# Patient Record
Sex: Male | Born: 1950 | Race: White | Hispanic: No | Marital: Married | Smoking: Former smoker
Health system: Southern US, Community
[De-identification: ages and names within clinical notes are randomized; demographics above are authoritative.]

## PROBLEM LIST (undated history)

## (undated) DIAGNOSIS — E785 Hyperlipidemia, unspecified: Secondary | ICD-10-CM

## (undated) DIAGNOSIS — G4730 Sleep apnea, unspecified: Secondary | ICD-10-CM

## (undated) DIAGNOSIS — M4320 Fusion of spine, site unspecified: Secondary | ICD-10-CM

## (undated) DIAGNOSIS — I2510 Atherosclerotic heart disease of native coronary artery without angina pectoris: Secondary | ICD-10-CM

## (undated) DIAGNOSIS — G5600 Carpal tunnel syndrome, unspecified upper limb: Secondary | ICD-10-CM

## (undated) DIAGNOSIS — K219 Gastro-esophageal reflux disease without esophagitis: Secondary | ICD-10-CM

## (undated) HISTORY — PX: CATARACT EXTRACTION: SUR2

## (undated) HISTORY — DX: Fusion of spine, site unspecified: M43.20

## (undated) HISTORY — DX: Hyperlipidemia, unspecified: E78.5

## (undated) HISTORY — PX: CERVICAL LAMINECTOMY: SHX94

## (undated) HISTORY — DX: Atherosclerotic heart disease of native coronary artery without angina pectoris: I25.10

## (undated) HISTORY — PX: OTHER SURGICAL HISTORY: SHX169

## (undated) HISTORY — DX: Carpal tunnel syndrome, unspecified upper limb: G56.00

## (undated) HISTORY — PX: CARPAL TUNNEL RELEASE: SHX101

## (undated) HISTORY — DX: Gastro-esophageal reflux disease without esophagitis: K21.9

## (undated) HISTORY — PX: CORONARY ARTERY BYPASS GRAFT: SHX141

## (undated) HISTORY — PX: RHINOPLASTY: SUR1284

---

## 2001-12-19 ENCOUNTER — Encounter: Payer: Self-pay | Admitting: Neurosurgery

## 2001-12-19 ENCOUNTER — Ambulatory Visit (HOSPITAL_COMMUNITY): Admission: RE | Admit: 2001-12-19 | Discharge: 2001-12-20 | Payer: Self-pay | Admitting: Neurosurgery

## 2002-01-11 ENCOUNTER — Encounter: Payer: Self-pay | Admitting: Neurosurgery

## 2002-01-11 ENCOUNTER — Encounter: Admission: RE | Admit: 2002-01-11 | Discharge: 2002-01-11 | Payer: Self-pay | Admitting: Neurosurgery

## 2002-09-18 ENCOUNTER — Ambulatory Visit (HOSPITAL_BASED_OUTPATIENT_CLINIC_OR_DEPARTMENT_OTHER): Admission: RE | Admit: 2002-09-18 | Discharge: 2002-09-18 | Payer: Self-pay | Admitting: Orthopedic Surgery

## 2006-08-22 ENCOUNTER — Ambulatory Visit (HOSPITAL_COMMUNITY): Admission: RE | Admit: 2006-08-22 | Discharge: 2006-08-22 | Payer: Self-pay | Admitting: Ophthalmology

## 2009-08-20 ENCOUNTER — Encounter: Payer: Self-pay | Admitting: Cardiology

## 2009-12-16 ENCOUNTER — Encounter: Payer: Self-pay | Admitting: Cardiology

## 2009-12-24 ENCOUNTER — Encounter: Payer: Self-pay | Admitting: Cardiology

## 2009-12-30 ENCOUNTER — Ambulatory Visit: Payer: Self-pay | Admitting: Cardiology

## 2010-01-02 ENCOUNTER — Ambulatory Visit: Payer: Self-pay | Admitting: Cardiology

## 2010-01-02 ENCOUNTER — Encounter (INDEPENDENT_AMBULATORY_CARE_PROVIDER_SITE_OTHER): Payer: Self-pay | Admitting: *Deleted

## 2010-01-02 DIAGNOSIS — M279 Disease of jaws, unspecified: Secondary | ICD-10-CM | POA: Insufficient documentation

## 2010-01-02 DIAGNOSIS — H469 Unspecified optic neuritis: Secondary | ICD-10-CM | POA: Insufficient documentation

## 2010-01-02 DIAGNOSIS — R072 Precordial pain: Secondary | ICD-10-CM | POA: Insufficient documentation

## 2010-01-02 DIAGNOSIS — R9430 Abnormal result of cardiovascular function study, unspecified: Secondary | ICD-10-CM | POA: Insufficient documentation

## 2010-01-05 ENCOUNTER — Encounter: Payer: Self-pay | Admitting: Cardiology

## 2010-01-06 ENCOUNTER — Ambulatory Visit: Payer: Self-pay | Admitting: Cardiovascular Disease

## 2010-01-06 ENCOUNTER — Encounter: Payer: Self-pay | Admitting: Cardiovascular Disease

## 2010-01-06 ENCOUNTER — Inpatient Hospital Stay (HOSPITAL_BASED_OUTPATIENT_CLINIC_OR_DEPARTMENT_OTHER): Admission: RE | Admit: 2010-01-06 | Discharge: 2010-01-06 | Payer: Self-pay | Admitting: Cardiovascular Disease

## 2010-01-06 ENCOUNTER — Ambulatory Visit (HOSPITAL_COMMUNITY): Admission: EM | Admit: 2010-01-06 | Discharge: 2010-01-07 | Payer: Self-pay | Admitting: Cardiology

## 2010-01-06 ENCOUNTER — Ambulatory Visit: Payer: Self-pay | Admitting: Thoracic Surgery (Cardiothoracic Vascular Surgery)

## 2010-01-07 ENCOUNTER — Ambulatory Visit: Payer: Self-pay | Admitting: Vascular Surgery

## 2010-01-07 ENCOUNTER — Encounter: Payer: Self-pay | Admitting: Thoracic Surgery (Cardiothoracic Vascular Surgery)

## 2010-01-13 ENCOUNTER — Inpatient Hospital Stay (HOSPITAL_COMMUNITY)
Admission: RE | Admit: 2010-01-13 | Discharge: 2010-01-17 | Payer: Self-pay | Admitting: Thoracic Surgery (Cardiothoracic Vascular Surgery)

## 2010-01-13 ENCOUNTER — Encounter: Payer: Self-pay | Admitting: Thoracic Surgery (Cardiothoracic Vascular Surgery)

## 2010-01-15 ENCOUNTER — Encounter: Payer: Self-pay | Admitting: Physician Assistant

## 2010-01-23 ENCOUNTER — Ambulatory Visit: Payer: Self-pay | Admitting: Thoracic Surgery (Cardiothoracic Vascular Surgery)

## 2010-02-04 ENCOUNTER — Ambulatory Visit: Payer: Self-pay | Admitting: Physician Assistant

## 2010-02-04 DIAGNOSIS — R42 Dizziness and giddiness: Secondary | ICD-10-CM

## 2010-02-04 DIAGNOSIS — E785 Hyperlipidemia, unspecified: Secondary | ICD-10-CM

## 2010-02-04 DIAGNOSIS — I2510 Atherosclerotic heart disease of native coronary artery without angina pectoris: Secondary | ICD-10-CM

## 2010-02-16 ENCOUNTER — Encounter: Payer: Self-pay | Admitting: Cardiovascular Disease

## 2010-02-16 ENCOUNTER — Ambulatory Visit: Payer: Self-pay | Admitting: Thoracic Surgery (Cardiothoracic Vascular Surgery)

## 2010-02-16 ENCOUNTER — Encounter
Admission: RE | Admit: 2010-02-16 | Discharge: 2010-02-16 | Payer: Self-pay | Admitting: Thoracic Surgery (Cardiothoracic Vascular Surgery)

## 2010-02-17 ENCOUNTER — Encounter: Payer: Self-pay | Admitting: Cardiology

## 2010-02-25 ENCOUNTER — Telehealth (INDEPENDENT_AMBULATORY_CARE_PROVIDER_SITE_OTHER): Payer: Self-pay | Admitting: *Deleted

## 2010-02-26 ENCOUNTER — Encounter: Payer: Self-pay | Admitting: Cardiology

## 2010-03-16 ENCOUNTER — Telehealth (INDEPENDENT_AMBULATORY_CARE_PROVIDER_SITE_OTHER): Payer: Self-pay | Admitting: *Deleted

## 2010-03-23 ENCOUNTER — Encounter: Payer: Self-pay | Admitting: Cardiology

## 2010-04-20 ENCOUNTER — Encounter: Payer: Self-pay | Admitting: Physician Assistant

## 2010-04-21 ENCOUNTER — Encounter: Payer: Self-pay | Admitting: Cardiology

## 2010-05-20 ENCOUNTER — Encounter: Payer: Self-pay | Admitting: Cardiology

## 2010-06-10 ENCOUNTER — Encounter: Payer: Self-pay | Admitting: Physician Assistant

## 2010-06-11 ENCOUNTER — Ambulatory Visit
Admission: RE | Admit: 2010-06-11 | Discharge: 2010-06-11 | Payer: Self-pay | Source: Home / Self Care | Attending: Cardiology | Admitting: Cardiology

## 2010-07-07 NOTE — Letter (Signed)
Summary: External Correspondence/ [facility] VIS[facility]  External Correspondence/ [facility]   Imported By: Dorise Hiss 01/01/2010 16:39:59  _____________________________________________________________________  External Attachment:    Type:   Image     Comment:   External Document

## 2010-07-07 NOTE — Progress Notes (Signed)
Summary: Pt call  Phone Note Call from Patient Call back at Home Phone [phone]   Action Taken: Rx Called In Summary of Call: Pt wanted to make sure labs had not fallen through the cracks b/c he states he was told we would set up lab work and call him with appt. Pt notified lab work and f/u appt are due in Dec. Per pt instructions from last office visit, pt will be mailed a reminder for labs and appt due in Dec.   Pt also states he has been having trouble moving his bowels and is having some (R) lower abd pain. Pt notified to see primary MD to evaluate this. Pt verbalized understanding.  Initial call taken by: Cyril Loosen, RN, BSN,  February 25, 2010 5:09 PM

## 2010-07-07 NOTE — Miscellaneous (Signed)
Summary: Rehab Report/ CARDIAC REHAB PROGRESS REPORT  Rehab Report/ CARDIAC REHAB PROGRESS REPORT   Imported By: Dorise Hiss 04/21/2010 16:55:57  _____________________________________________________________________  External Attachment:    Type:   Image     Comment:   External Document

## 2010-07-07 NOTE — Progress Notes (Signed)
Summary: thinks crestor causing abdominal pain      Phone Note Call from Patient   Summary of Call: Has cut back on Crestor.  Thinks may have been causing abdominal pain.  Started while in hospital.  Did see Dr. Dimas Aguas about 2 wks ago, did u/s and found nothing.  Now since cutting back on Crestor, stomach does feel a little better.  Also, has stopped the Lopressor all together.  Will f/u back up wtih PMD if stomach continues to bother.  Taking Crestor every 3 days now and questions changing to something else.   Initial call taken by: Hoover Brunette, LPN,  March 16, 2010 1:39 PM  Follow-up for Phone Call        just a Pravachol 40 mg q.h.s. Follow-up by: Lewayne Bunting, MD, [facility],  March 19, 2010 12:15 PM  Additional Follow-up for Phone Call Additional follow up Details #1::        Left message to return call.  Hoover Brunette, LPN  March 20, 2010 5:39 PM     Additional Follow-up for Phone Call Additional follow up Details #2::    Patient notified.   Will send rx to [facility] per pt request.   Follow-up by: Hoover Brunette, LPN,  March 23, 2010 11:46 AM  New/Updated Medications: PRAVACHOL 40 MG TABS (PRAVASTATIN SODIUM) Take 1 tab by mouth at bedtime Prescriptions: PRAVACHOL 40 MG TABS (PRAVASTATIN SODIUM) Take 1 tab by mouth at bedtime  #30 x 6   Entered by:   Hoover Brunette, LPN   Authorized by:   Lewayne Bunting, MD, [facility]   Signed by:   Hoover Brunette, LPN on 16/03/9603   Method used:   Electronically to        [facility]* (retail)       [address]       [place], [place]  [address]       Ph: [phone]       Fax: [phone]   RxID:   [id number]

## 2010-07-07 NOTE — Miscellaneous (Signed)
Summary: Rehab Report/ FAXED CARDIAC REHAB REFERRAL  Rehab Report/ FAXED CARDIAC REHAB REFERRAL   Imported By: Dorise Hiss 02/17/2010 12:35:27  _____________________________________________________________________  External Attachment:    Type:   Image     Comment:   External Document

## 2010-07-07 NOTE — Assessment & Plan Note (Signed)
Summary: Adrian Gibson   Visit Type:  Follow-up Primary Provider:  Prentiss Bells   History of Present Illness: patient presents for post CABG followup.  He was recently referred for diagnostic cardiac catheterization, per Dr. Andee Lineman, and found to have severe, three-vessel CAD with moderately severe distal left main disease. Left ventricular function was normal. He was referred to Dr. Cornelius Moras, and underwent successful four-vessel CABG, with grafting of LIMA-LAD; SVG-first diagonal; SVG-ramus intermedius; and, SVG distal RCA.  Clinically, patient is doing extremely well. He did have some transient left jaw discomfort approximate 4 days ago, but not nearly as severe as he would typically experience in the past. He also is reporting occasional sensation of feeling groggy, shortly after taking Lopressor. He has noted early morning blood pressure readings in the 95-100 range, at which time he has held his dose. He states that he had to do this on 3 separate occasions. He was not previously on beta blocker, did not have a myocardial infarction, and had no documented SVT.  Preventive Screening-Counseling & Management  Alcohol-Tobacco     Smoking Status: never     Year Quit: quit chewing August 2011  Current Medications (verified): 1)  Flonase 50 Mcg/act Susp (Fluticasone Propionate) .... 2 Sprays/nostril Daily 2)  Eq Chlortabs 4 Mg Tabs (Chlorpheniramine Maleate) .... As Needed 3)  Imdur 30 Mg Xr24h-Tab (Isosorbide Mononitrate) .... Take 1 Tablet By Mouth Once A Day 4)  Aspirin 325 Mg Tabs (Aspirin) .... Take 1 Tablet By Mouth Once A Day 5)  Nitrostat 0.4 Mg Subl (Nitroglycerin) .... Dissolve One Tablet Under Tongue For Severe Chest Pain As Needed Every 5 Minutes, Not To Exceed 3 in 15 Min Time Frame 6)  Multivitamins  Tabs (Multiple Vitamin) .... Take 1 Tablet By Mouth Once A Day 7)  Crestor 20 Mg Tabs (Rosuvastatin Calcium) .... Take 1 Tablet By Mouth Once A Day 8)  Metoprolol Tartrate 25 Mg Tabs  (Metoprolol Tartrate) .... Take 1 Tablet By Mouth Two Times A Day 9)  Hydrocodone-Acetaminophen 7.5-500 Mg Tabs (Hydrocodone-Acetaminophen) .... Take 1-2 By Mouth Every 4-6 Hours As Needed Pain 10)  Senna-Plus 8.6-50 Mg Tabs (Sennosides-Docusate Sodium) .... Take 2 Tablet By Mouth Once A Day As Needed 11)  Afrin Nasal Spray 0.05 % Soln (Oxymetazoline Hcl) .... As Needed  Allergies (verified): No Known Drug Allergies  Comments:  Nurse/Medical Assistant: The patient's medication bottles and allergies were reviewed with the patient and were updated in the Medication and Allergy Lists.  Past History:  Past Medical History: cervical vertebra fusion C5 C6 C7 nasal polyps with rhinoplasty 2000 and carpal tunnel syndrome Multivessel/left main CAD... four-vessel CABG, 8/11 Normal LV function Dyslipidemia  Social History: Smoking Status:  never  Review of Systems       No fevers, chills, hemoptysis, dysphagia, melena, hematocheezia, hematuria, rash, claudication, orthopnea, pnd, pedal edema. All other systems negative.   Vital Signs:  Patient profile:   60 year old male Height:      69 inches Weight:      230 pounds Pulse rate:   78 / minute BP sitting:   112 / 76  (left arm) Cuff size:   large  Vitals Entered By: Carlye Grippe (February 04, 2010 2:01 PM)  Physical Exam  Additional Exam:  GEN: 60 year old male, sitting upright, in no distress HEENT: NCAT,PERRLA,EOMI NECK: palpable pulses, no bruits; no JVD; no TM LUNGS: CTA bilaterally HEART: RRR (S1S2); no significant murmurs; no rubs; no gallops ABD: soft, NT; intact BS EXT:  intact distal pulses; no edema SKIN: well-healed incisions MUSC: no obvious deformity NEURO: A/O (x3)     EKG  Procedure date:  02/04/2010  Findings:      normal sinus rhythm at 70 bpm; normal axis; T wave inversion in the high lateral and leads V2-V4.  Impression & Recommendations:  Problem # 1:  CAD (ICD-414.00)  patient is doing  extremely well, following recent 4 vessel CABG for treatment of severe, three-vessel CAD and left main disease. He has normal LV function. He is highly motivated and is soon to enroll in cardiac rehabilitation, following scheduled followup with Dr. Cornelius Moras. We'll schedule a return clinic visit with Dr. Andee Lineman, in 3 months.  Problem # 2:  DYSLIPIDEMIA (ICD-272.4)  aggressive lipid management recommended with target LDL of 70 or less, if feasible. Of note, patient had difficulty tolerating Lipitor in the past, but is, thus far, doing well on Crestor. Will reassess lipid status with a fasting lipid/liver profile in 12 weeks.  Problem # 3:  DIZZINESS (ICD-780.4)  suspect this is related to transient hypotension, after taking Lopressor. Therefore, will decrease Lopressor to 12.5 mg b.i.d., and eventually plan to discontinue this, altogether. He has no strict indication for this, with normal LV function, no history of MI, hypertension, or SVT.  Other Orders: EKG w/ Interpretation (93000)  Patient Instructions: 1)  Your physician wants you to follow-up in: 3 months. You will receive a reminder letter in the mail one-two months in advance. If you don't receive a letter, please call our office to schedule the follow-up appointment. 2)  Your physician recommends that you go to the [facility] for a FASTING lipid profile and liver function labs:  DO IN 3 MONTHS BEFORE YOUR APPOINTMENT. We will mail a reminder when it is time for this lab work. 3)  Decrease Lopressor (metoprolol tart) to 12.5mg  by mouth two times a day. This will be 1/2 of your 25 mg tablets.

## 2010-07-07 NOTE — Consult Note (Signed)
Summary: [facility]   Imported By: Roderic Ovens 01/14/2010 16:07:06  _____________________________________________________________________  External Attachment:    Type:   Image     Comment:   External Document

## 2010-07-07 NOTE — Miscellaneous (Signed)
Summary: Rehab Report/ CARDIAC REHAB PROGRESS REPORT  Rehab Report/ CARDIAC REHAB PROGRESS REPORT   Imported By: Dorise Hiss 02/26/2010 16:22:39  _____________________________________________________________________  External Attachment:    Type:   Image     Comment:   External Document

## 2010-07-07 NOTE — Miscellaneous (Signed)
Summary: FLP/LFT ORDERS  Clinical Lists Changes  Orders: Added new Test order of T-Hepatic Function [phone]) - Signed Added new Test order of T-Lipid Profile [phone]) - Signed

## 2010-07-07 NOTE — Miscellaneous (Signed)
Summary: Rehab Report/ CARDIAC REHAB PROGRESS REPORT  Rehab Report/ CARDIAC REHAB PROGRESS REPORT   Imported By: Dorise Hiss 03/24/2010 13:52:01  _____________________________________________________________________  External Attachment:    Type:   Image     Comment:   External Document

## 2010-07-07 NOTE — Letter (Signed)
Summary: Cardiac Cath Instructions - [facility]  [facility] [address]   [place], [place] [address]   Phone: [phone]  Fax: [phone]     01/02/2010 MRN: [id number]  [facility] [address] [place], [place]  [address]  Dear Mr. Tschantz,   You are scheduled for a Cardiac Catheterization on Tuesday, August 2 with Dr. Eden Emms at 10:30.  Please arrive to the 1st floor of the Heart and Vascular Center at [facility] at 9:30 am on the day of your procedure. Please do not arrive before 6:30 a.m. Call the Heart and Vascular Center at [phone] if you are unable to make your appointmnet. The Code to get into the parking garage under the building is 0002. Take the elevators to the 1st floor. You must have someone to drive you home. Someone must be with you for the first 24 hours after you arrive home. Please wear clothes that are easy to get on and off and wear slip-on shoes. Do not eat or drink after midnight except water with your medications that morning. Bring all your medications and current insurance cards with you.  ___ DO NOT take these medications before your procedure:  _X__ Make sure you take your aspirin.  _X__ You may take ALL of your medications with water that morning.  ___ DO NOT take ANY medications before your procedure.  ___ Pre-med instructions:  ________________________________________________________________________  The usual length of stay after your procedure is 2 to 3 hours. This can vary.  If you have any questions, please call the office at the number listed above.  Hoover Brunette, LPN               Directions to the [facility]  Please Note : Park in [place] under the building not the parking deck.  From [employer]: Turn onto [address] Left onto [address] (1st stoplight) Right at the brick entrance to the hospital ([address] circle [address]) Bear to the  right and you will see a blue sign "Heart and Vascular Center" Parking garage is a sharp right'to get through the gate out in the code _______. Once you park, take the elevator to the first floor. Please do not arrive before 0630am. The building will be dark before that time.   From [address] Turn onto [address] Turn left into the brick entrance to the hospital (Main circle drive) Bear to the right and you will see a blue sign "[facility]" Parking garage is a sharp right, to get thru the gate put in the code ____. Once you park, take the elevator to the first floor. Please do not arrive before 0630am. The building will be dark before that time

## 2010-07-07 NOTE — Letter (Signed)
Summary: [facility] Office Visit   [facility] Visit   Imported By: Roderic Ovens 03/31/2010 14:28:21  _____________________________________________________________________  External Attachment:    Type:   Image     Comment:   External Document

## 2010-07-07 NOTE — Assessment & Plan Note (Signed)
Summary: NP6- ABNORMAL STRESS ECHO   Visit Type:  Initial Consult Primary Provider:  Prentiss Bells   History of Present Illness: the patient is a 60 year old male with a history of a recent abnormal Cardiolite stress test.  The patient was noted to have 2-mm ST segment depression on electrocardiogram.  His echocardiographic images unfortunately were nondiagnostic and were obtained at below 85% of maximum predicted heart rate.  The patient has been referred for further evaluation.  The patient is an active individual.  He likes hiking in fishing.  He has noticed however that over the last year at times when he walks fast he develops pain in both jaw areas and neck.  This resolves with rest.  Three weeks ago had an annual exam and was found to have copy of the left optic nerve.  He was referred to his primary care physician and mentioned that he had had some shortness of breath on exertion and pain in the jaw area.  Subsequently a stress echo cardiographic study was ordered.  The patient is a history of dyslipidemia as well as family history of heart disease.  Is unable to take statins which is insignificant myalgias.  He has not reported any resting chest pain.  He reports no orthopnea PND palpitations or syncope.  Preventive Screening-Counseling & Management  Alcohol-Tobacco     Smoking Status: quit     Year Quit: quit ago     Cans of tobacco/week: chews <1pk/wk     Tobacco Counseling: not to resume use of tobacco products  Current Medications (verified): 1)  Flonase 50 Mcg/act Susp (Fluticasone Propionate) .... 2 Sprays/nostril Daily 2)  Eq Chlortabs 4 Mg Tabs (Chlorpheniramine Maleate) .... As Needed 3)  Imdur 30 Mg Xr24h-Tab (Isosorbide Mononitrate) .... Take 1 Tablet By Mouth Once A Day 4)  Aspir-Low 81 Mg Tbec (Aspirin) .... Take 1 Tablet By Mouth Once A Day 5)  Nitrostat 0.4 Mg Subl (Nitroglycerin) .... Dissolve One Tablet Under Tongue For Severe Chest Pain As Needed Every 5  Minutes, Not To Exceed 3 in 15 Min Time Frame  Allergies (verified): No Known Drug Allergies  Comments:  Nurse/Medical Assistant: The patient's medications and allergies were verbally reviewed with the patient and were updated in the Medication and Allergy Lists.  Past History:  Past Medical History: cervical vertebra fusion C5 C6 C7 nasal polyps with rhinoplasty 2000 and carpal tunnel syndrome  Family History: Reviewed history and no changes required. father died from myocardial infarction in his 44s.  Brother had an MI at age 38 but he was a heavy smoker.  Social History: Reviewed history and no changes required. patient is a retired Freight forwarder.  He currently does not smoke.Smoking Status:  quit Cans of tobacco/week:  chews <1pk/wk  Review of Systems  The patient denies fatigue, malaise, fever, weight gain/loss, vision loss, decreased hearing, hoarseness, chest pain, palpitations, shortness of breath, prolonged cough, wheezing, sleep apnea, coughing up blood, abdominal pain, blood in stool, nausea, vomiting, diarrhea, heartburn, incontinence, blood in urine, muscle weakness, joint pain, leg swelling, rash, skin lesions, headache, fainting, dizziness, depression, anxiety, enlarged lymph nodes, easy bruising or bleeding, and environmental allergies.    Vital Signs:  Patient profile:   60 year old male Height:      69 inches Weight:      247 pounds BMI:     36.61 Pulse rate:   65 / minute BP sitting:   139 / 91  (left arm) Cuff size:  large  Vitals Entered By: Carlye Grippe (January 02, 2010 9:27 AM)  Nutrition Counseling: Patient's BMI is greater than 25 and therefore counseled on weight management options.  Physical Exam  Additional Exam:  General: Well-developed, well-nourished in no distress head: Normocephalic and atraumatic eyes PERRLA/EOMI intact, conjunctiva and lids normal nose: No deformity or lesions mouth normal dentition, normal posterior  pharynx neck: Supple, no JVD.  No masses, thyromegaly or abnormal cervical nodes lungs: Normal breath sounds bilaterally without wheezing.  Normal percussion heart: regular rate and rhythm with normal S1 and S2, no S3 or S4.  PMI is normal.  No pathological murmurs abdomen: Normal bowel sounds, abdomen is soft and nontender without masses, organomegaly or hernias noted.  No hepatosplenomegaly musculoskeletal: Back normal, normal gait muscle strength and tone normal pulsus: Pulse is normal in all 4 extremities Extremities: No peripheral pitting edema neurologic: Alert and oriented x 3 skin: Intact without lesions or rashes cervical nodes: No significant adenopathy psychologic: Normal affect    Impression & Recommendations:  Problem # 1:  NONSPECIFIC ABNORMAL UNSPEC CV FUNCTION STUDY (ICD-794.30) the patient's stress EKG is very concerning for ischemia.his echocardiographic images are nondiagnostic.  The symptoms are also concerning for ischemic heart disease.  I recommended a diagnostic cardiac catheterization.  I discussed her son benefits of this procedure with the patient and is willing to proceed.  I have prescribed Imdur and sublingual nitroglycerin p.r.n. in the interim.  He will also take aspirin 81 mg p.o. daily.  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) Assessment: Comment Only  Other Orders: T-Basic Metabolic Panel [id number]) T-CBC No Diff ([id number]) T-Protime, Auto ([id number]) T-PTT ([id number]) T-Chest x-ray, 2 views (53664) Cardiac Catheterization (Cardiac Cath)  Patient Instructions: 1)  JV Cath next week 2)  Imdur 30mg  daily 3)  Aspirin 81mg  daily 4)  Nitroglycerin as needed for severe chest pain 5)  Follow up in  1 month Prescriptions: NITROSTAT 0.4 MG SUBL (NITROGLYCERIN) dissolve one tablet under tongue for severe chest pain as needed every 5 minutes, not to exceed 3 in 15 min  time frame  #25 x 3   Entered by:   Hoover Brunette, LPN   Authorized by:   Lewayne Bunting, MD, [facility]   Signed by:   Hoover Brunette, LPN on 40/34/7425   Method used:   Electronically to        [facility]* (retail)       [address]       [place], [place]  [address]       Ph: [phone]       Fax: [phone]   RxID:   [id number]   Handout requested. IMDUR 30 MG XR24H-TAB (ISOSORBIDE MONONITRATE) Take 1 tablet by mouth once a day  #30 x 6   Entered by:   Hoover Brunette, LPN   Authorized by:   Lewayne Bunting, MD, [facility]   Signed by:   Hoover Brunette, LPN on 25/42/7062   Method used:   Electronically to        [facility]* (retail)       [address]       [place], [place]  [address]       Ph: [phone]       Fax: [phone]   RxID:   [id number]   Handout requested.

## 2010-07-07 NOTE — Letter (Signed)
Summary: External Correspondence/ [facility]ICE VISIT  External Correspondence/ [facility] VISIT   Imported By: Dorise Hiss 01/01/2010 16:38:38  _____________________________________________________________________  External Attachment:    Type:   Image     Comment:   External Document

## 2010-07-07 NOTE — Letter (Signed)
Summary: External Correspondence/ FAXED PRE-CATH ORDER  External Correspondence/ FAXED PRE-CATH ORDER   Imported By: Dorise Hiss 01/16/2010 09:58:59  _____________________________________________________________________  External Attachment:    Type:   Image     Comment:   External Document

## 2010-07-09 NOTE — Assessment & Plan Note (Signed)
Summary: 3 MO FUL   Visit Type:  Follow-up Primary Provider:  Prentiss Bells   History of Present Illness: the patient is a 60 year old male with a history of severe coronary artery disease status post 4 vessel coronary bypass grafting. Reportedly the patient has preserved LV function. The patient has done well. He has gradually increase his exercise tolerance. He denies any chest pain or shortness of breath. He has lost 30 pounds since surgery. He goes to the [facility] on a regular basis. He does 30 minutes on the treadmill for 30 minutes on the bike every couple of days. He denies any palpitations presyncope or syncope. The patient presents for routine followup.   Preventive Screening-Counseling & Management  Alcohol-Tobacco     Smoking Status: never  Current Medications (verified): 1)  Flonase 50 Mcg/act Susp (Fluticasone Propionate) .... 2 Sprays/nostril Daily 2)  Eq Chlortabs 4 Mg Tabs (Chlorpheniramine Maleate) .... As Needed 3)  Aspirin 325 Mg Tabs (Aspirin) .... Take 1 Tablet By Mouth Once A Day 4)  Nitrostat 0.4 Mg Subl (Nitroglycerin) .... Dissolve One Tablet Under Tongue For Severe Chest Pain As Needed Every 5 Minutes, Not To Exceed 3 in 15 Min Time Frame 5)  Multivitamins  Tabs (Multiple Vitamin) .... Take 1 Tablet By Mouth Once A Day 6)  Pravachol 40 Mg Tabs (Pravastatin Sodium) .... Take 1 Tab By Mouth At Bedtime 7)  Afrin Nasal Spray 0.05 % Soln (Oxymetazoline Hcl) .... As Needed 8)  Claritin-D 12 Hour 5-120 Mg Xr12h-Tab (Loratadine-Pseudoephedrine) .... As Needed  Allergies (verified): No Known Drug Allergies  Comments:  Nurse/Medical Assistant: The patient's medications and allergies were reviewed with the patient and were updated in the Medication and Allergy Lists. Reviewed list w/ pt. Tammi Romine CMA (June 11, 2010 2:10 PM)  Past History:  Past Medical History: Last updated: 02/04/2010 cervical vertebra fusion C5 C6 C7 nasal polyps with rhinoplasty 2000  and carpal tunnel syndrome Multivessel/left main CAD... four-vessel CABG, 8/11 Normal LV function Dyslipidemia  Past Surgical History: Last updated: 02/04/2010  1. Cervical laminectomy and diskectomy.   2. Ulnar nerve release from left.   3. Left carpal tunnel release.   4. Bilateral cataract extraction.   5. Nasal rhinoplasty.     Family History: Last updated: 01-06-2010 father died from myocardial infarction in his 71s.  Brother had an MI at age 60 but he was a heavy smoker.  Social History: Last updated: 02/04/2010    The patient is a retired high school principal who   lives with his wife in [place].  Enjoys woodworking and fishing.  He has   not been limited physically.  He uses chewing tobacco.  He has a remote   history of smoking cigars, but he quit doing this several years ago.  He   denies any history of excessive alcohol consumption.      Risk Factors: Smoking Status: never (06/11/2010) Cans of tobacco/wk: chews <1pk/wk (01-06-10)  Review of Systems  The patient denies anorexia, fever, weight loss, weight gain, vision loss, decreased hearing, hoarseness, chest pain, syncope, dyspnea on exertion, peripheral edema, prolonged cough, headaches, hemoptysis, abdominal pain, melena, hematochezia, severe indigestion/heartburn, hematuria, incontinence, genital sores, muscle weakness, suspicious skin lesions, transient blindness, difficulty walking, depression, unusual weight change, abnormal bleeding, enlarged lymph nodes, angioedema, breast masses, and testicular masses.    Vital Signs:  Patient profile:   60 year old male Height:      69 inches Weight:      223 pounds  BMI:     33.05 Pulse rate:   73 / minute BP sitting:   120 / 80  (left arm) Cuff size:   large  Vitals Entered By: Fuller Plan CMA (June 11, 2010 2:23 PM)  Physical Exam  Additional Exam:  GEN: 60 year old male, sitting upright, in no distress HEENT: NCAT,PERRLA,EOMI NECK: palpable pulses, no  bruits; no JVD; no TM LUNGS: CTA bilaterally HEART: RRR (S1S2); no significant murmurs; no rubs; no gallops ABD: soft, NT; intact BS EXT: intact distal pulses; no edema SKIN: well-healed incisions MUSC: no obvious deformity NEURO: A/O (x3)     Impression & Recommendations:  Problem # 1:  CAD (ICD-414.00) the patient is status post coronary bypass grafting. He has done well. He reports no recurrent chest pain. We will continue medical therapy The following medications were removed from the medication list:    Imdur 30 Mg Xr24h-tab (Isosorbide mononitrate) .Marland Kitchen... Take 1 tablet by mouth once a day    Metoprolol Tartrate 25 Mg Tabs (Metoprolol tartrate) .Marland Kitchen... Take 1/2 tablet by mouth two times a day His updated medication list for this problem includes:    Aspirin 325 Mg Tabs (Aspirin) .Marland Kitchen... Take 1 tablet by mouth once a day    Nitrostat 0.4 Mg Subl (Nitroglycerin) .Marland Kitchen... Dissolve one tablet under tongue for severe chest pain as needed every 5 minutes, not to exceed 3 in 15 min time frame  Problem # 2:  DYSLIPIDEMIA (ICD-272.4) . His lipid panel was reviewed. LDL cholesterol was 145 total cholesterol 208.I increased his Pravachol to 80 mg p.o. q. daily His updated medication list for this problem includes:    Pravachol 80 Mg Tabs (Pravastatin sodium) .Marland Kitchen... Take 1 tab every day  Problem # 3:  DIZZINESS (ICD-780.4) resolved.  Patient Instructions: 1)  Your physician recommends that you schedule a follow-up appointment in: 6 months 2)  Your physician has recommended you make the following change in your medication: Add: Zyterc 10mg  1 tab every day&  Debrox.  Increase: Pravachol to 80mg  every day at bedtime. Prescriptions: PRAVACHOL 80 MG TABS (PRAVASTATIN SODIUM) take 1 tab every day  #30 x 6   Entered by:   Fuller Plan CMA   Authorized by:   Lewayne Bunting, MD, [facility]   Signed by:   Fuller Plan CMA on 06/11/2010   Method used:   Electronically to        [facility]* (retail)       [address]       [place], [place]  [address]       Ph: [phone]       Fax: [phone]   RxID:   [id number]

## 2010-07-09 NOTE — Miscellaneous (Signed)
Summary: Rehab Report/ CARDIAC REHAB DISCHARGE SUMMARY  Rehab Report/ CARDIAC REHAB DISCHARGE SUMMARY   Imported By: Dorise Hiss 06/02/2010 11:17:21  _____________________________________________________________________  External Attachment:    Type:   Image     Comment:   External Document

## 2010-08-21 LAB — CBC
HCT: 33.5 % — ABNORMAL LOW (ref 39.0–52.0)
HCT: 44.7 % (ref 39.0–52.0)
Hemoglobin: 11.2 g/dL — ABNORMAL LOW (ref 13.0–17.0)
Hemoglobin: 12.4 g/dL — ABNORMAL LOW (ref 13.0–17.0)
MCH: 30.2 pg (ref 26.0–34.0)
MCH: 30.7 pg (ref 26.0–34.0)
MCH: 31.1 pg (ref 26.0–34.0)
MCHC: 32.6 g/dL (ref 30.0–36.0)
MCHC: 33.4 g/dL (ref 30.0–36.0)
MCHC: 33.7 g/dL (ref 30.0–36.0)
MCV: 91.8 fL (ref 78.0–100.0)
MCV: 92.4 fL (ref 78.0–100.0)
Platelets: 111 10*3/uL — ABNORMAL LOW (ref 150–400)
Platelets: 112 10*3/uL — ABNORMAL LOW (ref 150–400)
Platelets: 118 10*3/uL — ABNORMAL LOW (ref 150–400)
RBC: 3.73 MIL/uL — ABNORMAL LOW (ref 4.22–5.81)
RBC: 3.86 MIL/uL — ABNORMAL LOW (ref 4.22–5.81)
RBC: 4.83 MIL/uL (ref 4.22–5.81)
RDW: 12.9 % (ref 11.5–15.5)
RDW: 13 % (ref 11.5–15.5)
RDW: 13.2 % (ref 11.5–15.5)
WBC: 13.4 10*3/uL — ABNORMAL HIGH (ref 4.0–10.5)
WBC: 13.4 10*3/uL — ABNORMAL HIGH (ref 4.0–10.5)
WBC: 15.5 10*3/uL — ABNORMAL HIGH (ref 4.0–10.5)
WBC: 6.1 10*3/uL (ref 4.0–10.5)
WBC: 8.6 10*3/uL (ref 4.0–10.5)

## 2010-08-21 LAB — COMPREHENSIVE METABOLIC PANEL
ALT: 28 U/L (ref 0–53)
AST: 24 U/L (ref 0–37)
Albumin: 3.9 g/dL (ref 3.5–5.2)
BUN: 12 mg/dL (ref 6–23)
Chloride: 103 mEq/L (ref 96–112)
GFR calc non Af Amer: >60 mL/min (ref >60)
Glucose, Bld: 105 mg/dL — ABNORMAL HIGH (ref 70–99)
Potassium: 4.9 mEq/L (ref 3.5–5.1)
Sodium: 138 mEq/L (ref 135–145)
Total Bilirubin: 0.7 mg/dL (ref 0.3–1.2)

## 2010-08-21 LAB — POCT I-STAT 4, (NA,K, GLUC, HGB,HCT)
Glucose, Bld: 113 mg/dL — ABNORMAL HIGH (ref 70–99)
Glucose, Bld: 114 mg/dL — ABNORMAL HIGH (ref 70–99)
Glucose, Bld: 117 mg/dL — ABNORMAL HIGH (ref 70–99)
HCT: 31 % — ABNORMAL LOW (ref 39.0–52.0)
HCT: 31 % — ABNORMAL LOW (ref 39.0–52.0)
Hemoglobin: 10.5 g/dL — ABNORMAL LOW (ref 13.0–17.0)
Hemoglobin: 11.2 g/dL — ABNORMAL LOW (ref 13.0–17.0)
Hemoglobin: 13.9 g/dL (ref 13.0–17.0)
Potassium: 4.6 mEq/L (ref 3.5–5.1)
Potassium: 5.5 mEq/L — ABNORMAL HIGH (ref 3.5–5.1)
Sodium: 138 mEq/L (ref 135–145)
Sodium: 141 mEq/L (ref 135–145)
Sodium: 142 mEq/L (ref 135–145)

## 2010-08-21 LAB — BASIC METABOLIC PANEL
BUN: 17 mg/dL (ref 6–23)
CO2: 23 mEq/L (ref 19–32)
Chloride: 101 mEq/L (ref 96–112)
Creatinine, Ser: 1.08 mg/dL (ref 0.4–1.5)
Glucose, Bld: 166 mg/dL — ABNORMAL HIGH (ref 70–99)
Potassium: 4.6 mEq/L (ref 3.5–5.1)
Sodium: 139 mEq/L (ref 135–145)

## 2010-08-21 LAB — POCT I-STAT 3, ART BLOOD GAS (G3+)
Acid-base deficit: 3 mmol/L — ABNORMAL HIGH (ref 0.0–2.0)
O2 Saturation: 100 %
Patient temperature: 35.7
pCO2 arterial: 31.1 mmHg — ABNORMAL LOW (ref 35.0–45.0)
pH, Arterial: 7.329 — ABNORMAL LOW (ref 7.350–7.450)
pH, Arterial: 7.341 — ABNORMAL LOW (ref 7.350–7.450)
pH, Arterial: 7.441 (ref 7.350–7.450)
pO2, Arterial: 138 mmHg — ABNORMAL HIGH (ref 80.0–100.0)
pO2, Arterial: 311 mmHg — ABNORMAL HIGH (ref 80.0–100.0)

## 2010-08-21 LAB — ABO/RH: ABO/RH(D): A POS

## 2010-08-21 LAB — TYPE AND SCREEN
ABO/RH(D): A POS
Antibody Screen: NEGATIVE
Antibody Screen: NEGATIVE

## 2010-08-21 LAB — CREATININE, SERUM
Creatinine, Ser: 0.91 mg/dL (ref 0.4–1.5)
GFR calc Af Amer: >60 mL/min (ref >60)
GFR calc Af Amer: >60 mL/min (ref >60)
GFR calc non Af Amer: >60 mL/min (ref >60)
GFR calc non Af Amer: >60 mL/min (ref >60)

## 2010-08-21 LAB — APTT
aPTT: 28 seconds (ref 24–37)
aPTT: 36 seconds (ref 24–37)

## 2010-08-21 LAB — POCT I-STAT, CHEM 8
Creatinine, Ser: 0.9 mg/dL (ref 0.4–1.5)
Glucose, Bld: 150 mg/dL — ABNORMAL HIGH (ref 70–99)
Hemoglobin: 12.9 g/dL — ABNORMAL LOW (ref 13.0–17.0)
Sodium: 141 mEq/L (ref 135–145)
TCO2: 22 mmol/L (ref 0–100)

## 2010-08-21 LAB — GLUCOSE, CAPILLARY
Glucose-Capillary: 107 mg/dL — ABNORMAL HIGH (ref 70–99)
Glucose-Capillary: 117 mg/dL — ABNORMAL HIGH (ref 70–99)
Glucose-Capillary: 163 mg/dL — ABNORMAL HIGH (ref 70–99)

## 2010-08-21 LAB — URINALYSIS, MICROSCOPIC ONLY
Bilirubin Urine: NEGATIVE
Glucose, UA: NEGATIVE mg/dL
Hgb urine dipstick: NEGATIVE
Leukocytes, UA: NEGATIVE
Nitrite: NEGATIVE
Specific Gravity, Urine: 1.006 (ref 1.005–1.030)
Urobilinogen, UA: 1 mg/dL (ref 0.0–1.0)

## 2010-08-21 LAB — PROTIME-INR
INR: 1.33 (ref 0.00–1.49)
Prothrombin Time: 16.7 seconds — ABNORMAL HIGH (ref 11.6–15.2)

## 2010-08-21 LAB — LIPID PANEL
HDL: 34 mg/dL — ABNORMAL LOW (ref >39)
LDL Cholesterol: 196 mg/dL — ABNORMAL HIGH (ref 0–99)
Total CHOL/HDL Ratio: 8.2 RATIO
Triglycerides: 241 mg/dL — ABNORMAL HIGH (ref <150)

## 2010-08-21 LAB — MAGNESIUM: Magnesium: 2.3 mg/dL (ref 1.5–2.5)

## 2010-08-21 LAB — BLOOD GAS, ARTERIAL: Acid-Base Excess: 0.8 mmol/L (ref 0.0–2.0)

## 2010-08-21 LAB — SURGICAL PCR SCREEN: Staphylococcus aureus: NEGATIVE

## 2010-10-20 NOTE — Assessment & Plan Note (Signed)
OFFICE VISIT   Adrian Gibson, Adrian Gibson  DOB:  14-Sep-1950                                        February 16, 2010  CHART #:  [id number]   HISTORY OF PRESENT ILLNESS:  The patient returns for routine a follow up  status post coronary artery bypass grafting x4 on January 13, 2010.  His  postoperative recovery has been uncomplicated.  Following hospital  discharge, he has continued to do well.  He was seen in follow up at the  [facility] office in [place] recently.  He complained that he was  not feeling well when he took his metoprolol, so his dose of metoprolol  has been cut in half to 12.5 mg twice daily.  He otherwise feels quite  well.  He has mild residual soreness in his chest that has continued to  gradually improve.  His exercise tolerance has continued to gradually  improve and he is walking quite regularly.  He plans to start the  cardiac rehab program soon.  He is not having problems with shortness of  breath.  He denies any fevers, chills, or productive cough.  He is  sleeping well at night.  His appetite is good.  Overall, he feels well.  He has no other complaints.   CURRENT MEDICATIONS:  Metoprolol, Crestor, aspirin, and hydrocodone as  needed for pain.   PHYSICAL EXAMINATION:  Notable for well-appearing male with blood  pressure 102/70, pulse 72, oxygen saturation 97% on room air.  Examination of the chest reveals median sternotomy incision that is  healing nicely.  The sternum is stable on palpation.  Breath sounds are  clear to auscultation.  No wheezes, rales, or rhonchi are noted.  Cardiovascular exam includes regular rate and rhythm.  No murmurs, rubs,  or gallops are appreciated.  The abdomen is soft, nontender.  The  extremities are warm and well perfused.  The small incisions from  endoscopic vein harvest in the right lower extremity are healing nicely.  The remainder of his physical exam is unremarkable.   DIAGNOSTIC TESTS:  Chest  x-ray performed today at the [facility] is reviewed.  This reveals clear lung fields bilaterally.  There  are no pleural effusions of any significance.  All the sternal wires  appear intact.  No other abnormalities noted.   IMPRESSION:  Excellent progress following recent coronary artery bypass  grafting.   PLAN:  I have encouraged the patient to continue to gradually increase  his physical activity as tolerated with his only limitation at this  point remaining that he refrain from heavy lifting or strenuous use of  his arms or shoulders for at least another 2 months.  I think it is safe  for him to resume driving an automobile.  I have encouraged him to  participate in the cardiac rehab program.  All of his questions have  been addressed.  In the future, he will call and return to see [place] as  needed.   Salvatore Decent. Cornelius Moras, M.D.  Electronically Signed   CHO/MEDQ  D:  02/16/2010  T:  02/17/2010  Job:  [id number]   cc:   Lorie Phenix, MD,FACC  Noralyn Pick Eden Emms, MD, [facility]

## 2010-10-23 NOTE — Op Note (Signed)
NAME:  Adrian Gibson, Adrian Gibson                        ACCOUNT NO.:  [id number]   MEDICAL RECORD NO.:  [id number]                   PATIENT TYPE:  AMB   LOCATION:  DSC                                  FACILITY:  [facility]   PHYSICIAN:  Katy Fitch. Naaman Plummer., M.D.          DATE OF BIRTH:  Nov 24, 1950   DATE OF PROCEDURE:  09/18/2002  DATE OF DISCHARGE:                                 OPERATIVE REPORT   PREOPERATIVE DIAGNOSES:  1. 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  2. Entrapment neuropathy, left median nerve at carpal tunnel.   POSTOPERATIVE DIAGNOSES:  1. 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  2. Entrapment neuropathy, left median nerve at carpal tunnel.   OPERATION:  1. Decompression of left ulnar nerve at cubital tunnel with in situ     decompression and partial resection of medial head of triceps.  2. Release of left transverse carpal ligament through separate incision.   OPERATING SURGEON:  Katy Fitch. Sypher, M.D.   ASSISTANT:  Marveen Reeks Dasnoit, PA-C   ANESTHESIA:  General by LMA.   SUPERVISING ANESTHESIOLOGIST:  Guadalupe Maple, M.D.   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 Dimas Aguas for evaluation and  management of a chronically numb left arm.   He was initially evaluated by Dr. Gerlene Fee for cervical pain and shoulder  pain and weakness.   Neurosurgical evaluation revealed signs of significant degenerative disk  disease at C4-5, C5-6, and C6-7.  [name] is now status post anterior  cervical discectomy and inner body fusion at C4-5, 5-6, and 6-7 with plate  fixation and allograft.   He recovered significant strength and had good pain relief of his shoulder  pain; however, he continued to have numbness of the left side.   Dr. Dimas Aguas referred him for electrodiagnostic studies in [place], [place], which revealed evidence of a significant entrapment  neuropathy of  the left ulnar nerve at the cubital tunnel and a significant entrapment  neuropathy of the left median nerve at the carpal tunnel.   Due to a failed response to nonoperative measures, [name] is brought to  the operating room at this time for decompression of his left ulnar and  median nerves.   PROCEDURE:  [name] is brought to the operating room and placed in  supine position upon the operating table.   Following induction of general anesthesia by LMA, the left arm was prepped  with Betadine soap and solution and sterilely draped.   Following exsanguination of the limb with an Esmarch bandage, the arterial  tourniquet was inflated to 220 mmHg on the proximal brachium.   The procedure commenced with a short incision in the line of the ring finger  in the palm.  Subcutaneous tissues were carefully divided, revealing the  palmar fascia.  This was split longitudinally through __________ of  the  median nerve.   These are followed back to the transverse carpal ligament, which was  carefully isolated from the median nerve.   The ligament was released along its ulnar border, extending into the distal  forearm.   This widely opened the carpal canal.   Complete __________ along the margin of the release ligament,  electrocauterized bipolar current, followed by repair of the skin with  intradermal 3-0 Prolene suture.   Attention was then directed to the elbow.   A separate incision was fashioned posterior to the medial epicondyle.  Subcutaneous tissues were carefully divided, taking care to identify and  gently spare the posterior branches of the medial and  brachiocutaneous nerve.   The arcuate ligament was identified and released on its posterior aspect,  freeing the ulnar nerve at the cubital tunnel.   The fascia at the head of the flexor carpi ulnaris was released with scissor  dissection, followed by gentle spreading of the muscle fibers.  Several   fiber strands at the head of the flexor carpi ulnaris were released with  scissors.   A Glorious Peach was then passed along nerve a distance of  seven centimeters distal  to the epicondyle, revealing satisfactory decompression.   Attention was then directed proximally, where the fascia overlying the nerve  and the triceps fascia were released a distance of seven centimeters above  the medial epicondyle.  The arcade of Jerrell Mylar was identified and released.   The nerve was examined through range of motion, 0 to 140 degrees.   The medial  head of triceps encroached at approximately 100 degrees of  flexion.  Therefore, a small strip of the medial head of the triceps and its  fascia were released with the electrocautery.   Thereafter, the nerve was fully decompressed and stable through a range of 0  to 140 degrees of flexion.   The wound was then inspected for bleeding points and repaired with  intradermal 3-0 Prolene.  There were no apparent complications.                                               Katy Fitch Naaman Plummer., M.D.    RVS/MEDQ  D:  09/18/2002  T:  09/18/2002  Job:  [id number]

## 2010-10-23 NOTE — Op Note (Signed)
[place]  Patient:    Adrian Gibson, Adrian Gibson Visit Number: [id number] MRN: [id number]          Service Type: DSU Location: 3000 [id number] Attending Physician:  Gerald Dexter Dictated by:   Reinaldo Meeker, M.D. Proc. Date: 12/19/01 Admit Date:  12/19/2001 Discharge Date: 12/20/2001                             Operative Report  PREOPERATIVE DIAGNOSIS: Cervical spinal stenosis C4-5, C5-6 and C6-7.  POSTOPERATIVE DIAGNOSIS: Cervical spinal stenosis C4-5, C5-6 and C6-7.  PROCEDURE: C4-5, C5-6 anterior cervical diskectomy with operating microscope followed by bone bank fusion followed by Atlantis anterior cervical plating with the operating microscope.  SURGEON:  Reinaldo Meeker, M.D.  ASSISTANT:  Donalee Citrin, M.D.  PROCEDURE IN DETAIL:  After being placed in ten pounds altered traction in the supine position, the patients neck was prepped and draped in the usual sterile fashion.  A localizing x-ray was taken prior to the incision to identify the appropriate level.  A transverse incision was made in the right anterior neck starting at the midline and headed toward the medial aspect of the sternocleidomastoid muscle.  The platysmus was then incised transversely. The natural fascial plane between the strap muscles medially and sternocleidomastoid laterally was identified and followed down to the anterior aspect of the anterior aspect of the cervical spine.  The longus coli muscle were identified, split in the midline and stripped away bilaterally with the Kitner centering key elevator.  A second x-ray showed we were at the appropriate levels and the C4-5, C5-6 and C6-7 interspaces were exposed.  A self retaining retractor was placed for exposure and used for the remainder of the case.  Using a 15 blade, the annulus of the disk at each space was incised.  Using pituitary rongeurs and curettes, approximately 90% of the disk material was removed at  each level. A high speed drill was used to widen the interspaces as well.  At this point the microscope was draped, brought onto the field and used for the remainder of the case.  Starting at C6-7, the remainder of the disk material along the posterior longitudinal ligament was removed.  The ligament was found to be calcified at this level.  This was dissected away from the underlying dura and removed in a piecemeal fashion.  A very thorough decompression was carried out across the spinal dura into the proximal foramen bilaterally.  At this point, inspection was carried out at this level for any evidence of residual compression and none could be identified.  A very thorough decompression was carried out at C5-6 paying particular attention to the right side where most of the pathology was noted to be at this level. A very thorough decompression once again was carried out of this underlying spinal dura and proximal foramen bilaterally until there was no evidence of any further compression at this level.  A similar decompression once again was carried out at C4-5 once again working particularly towards the patients right side where once again the pathology was noted to be on the MRI scan.  When this was completed, inspection was carried out at all three levels for any evidence of residual compression and none could be identified.  Two 7 mm and one 8 mm bone bank plugs were reconstituted.  After getting once more and confirming hemostasis, the plugs were impacted with the 7 mm  plugs going to C4-5 and C5-6 and the 8 mm plug at C6-7.  Fluoroscopy showed the plugs to be in good position. A 60 mm Atlantis anterior cervical plate was then chosen.  The 13 mm variable angled screws were then placed times eight using fluoroscopy to confirm good position.  When all the screws had been tightened down well, the locking device was secured. Large amounts of irrigation were carried out at this time and any  bleeding was controlled with bipolar coagulation.  One additional inspection was carried out to make sure that there was no additional oozing and none could be identified. The wound was then closed using interrupted Vicryl in the platysmal muscle and inverted 5-0 PDS in the subcuticular layer and Steri-Strips on the skin.  A sterile dressing and collar were applied. The patient was extubated and take  n to the recovery room in stable condition. Dictated by:   Reinaldo Meeker, M.D. Attending Physician:  Gerald Dexter DD:  12/19/01 TD:  12/22/01 Job: [id number]

## 2010-12-01 IMAGING — CR DG CHEST 2V
2 series · 2 of 2 positions shown · non-contrast
Comparison: 01/15/2010

CLINICAL DATA: CABG.  Weakness.

CHEST - 2 VIEW

[w chest pa *]
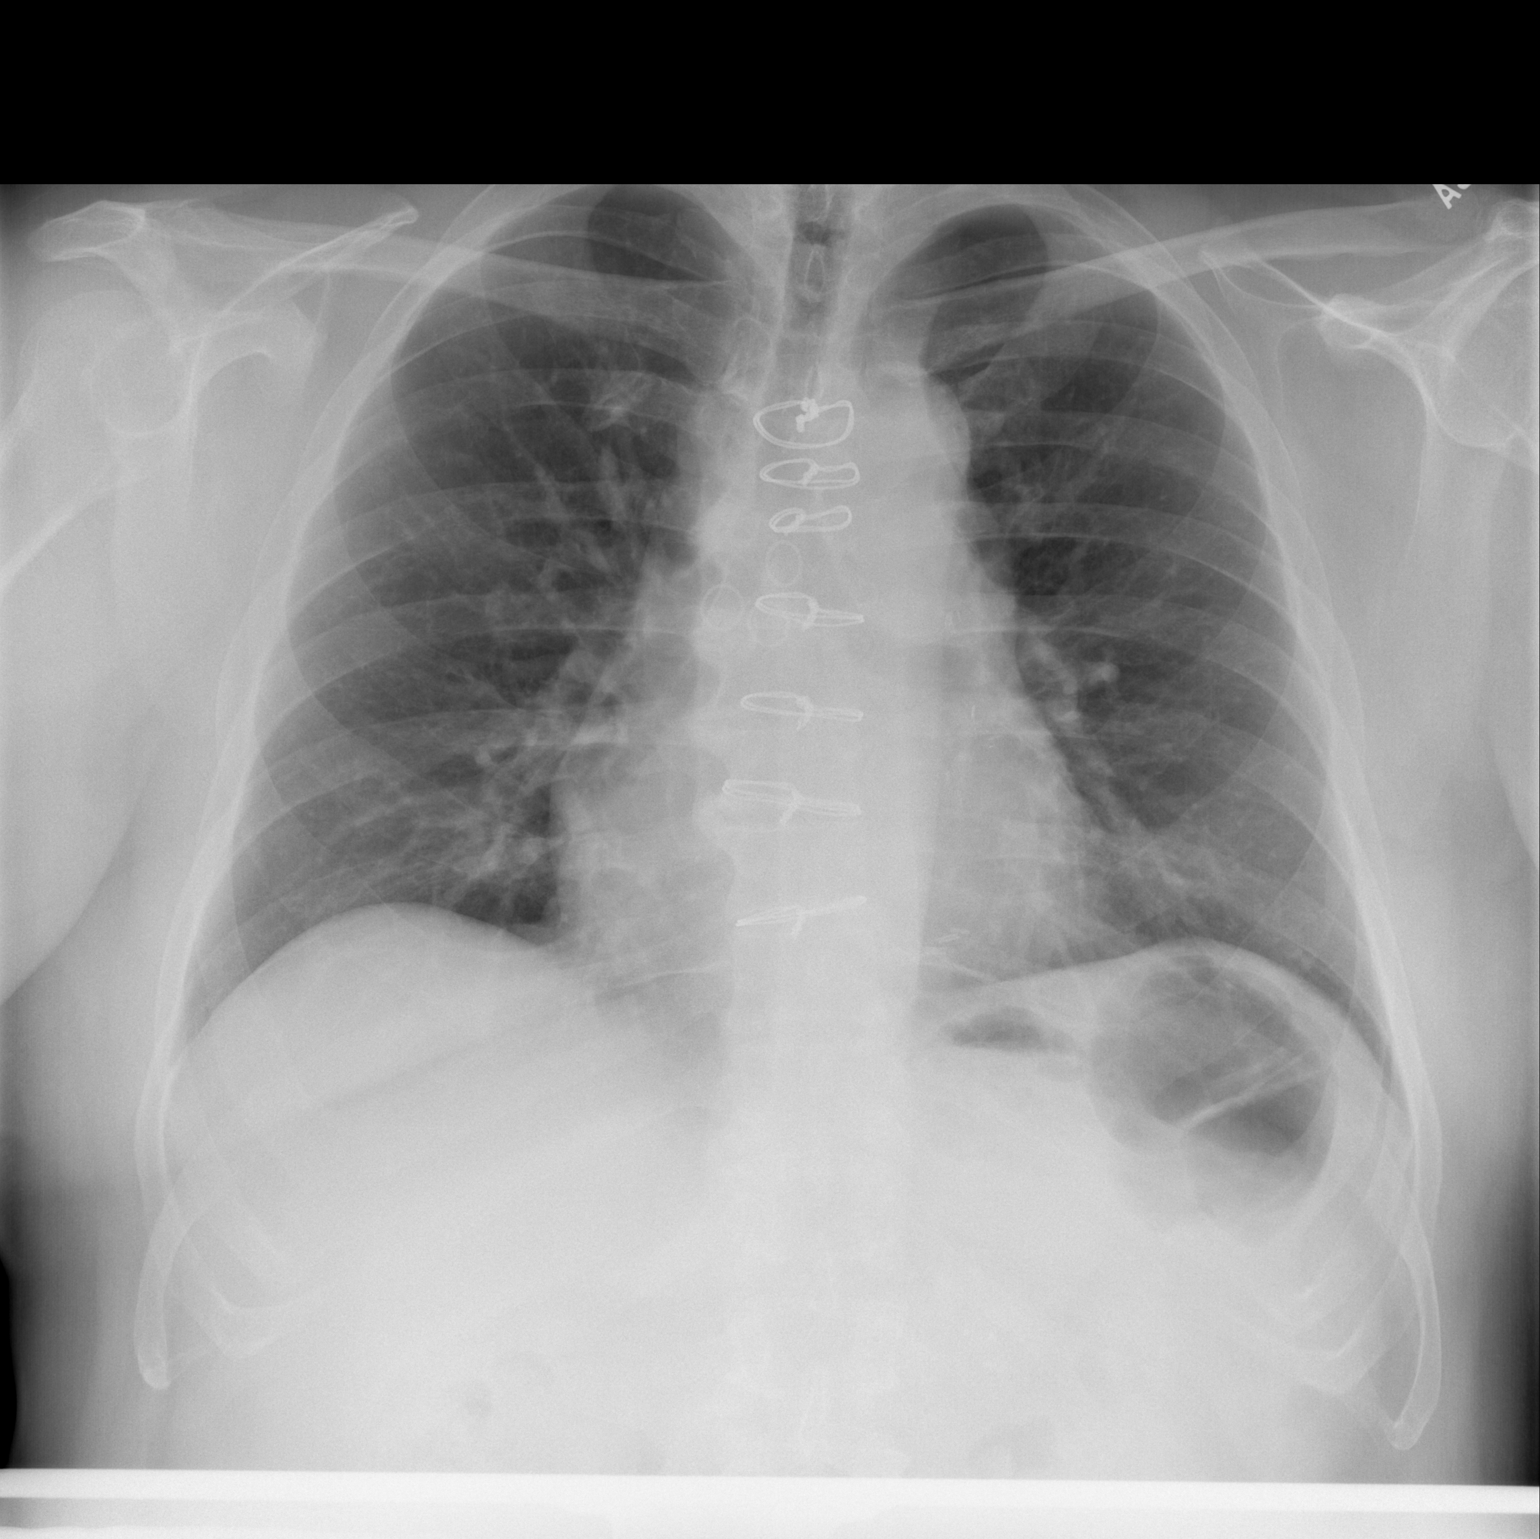

[w chest lat *]
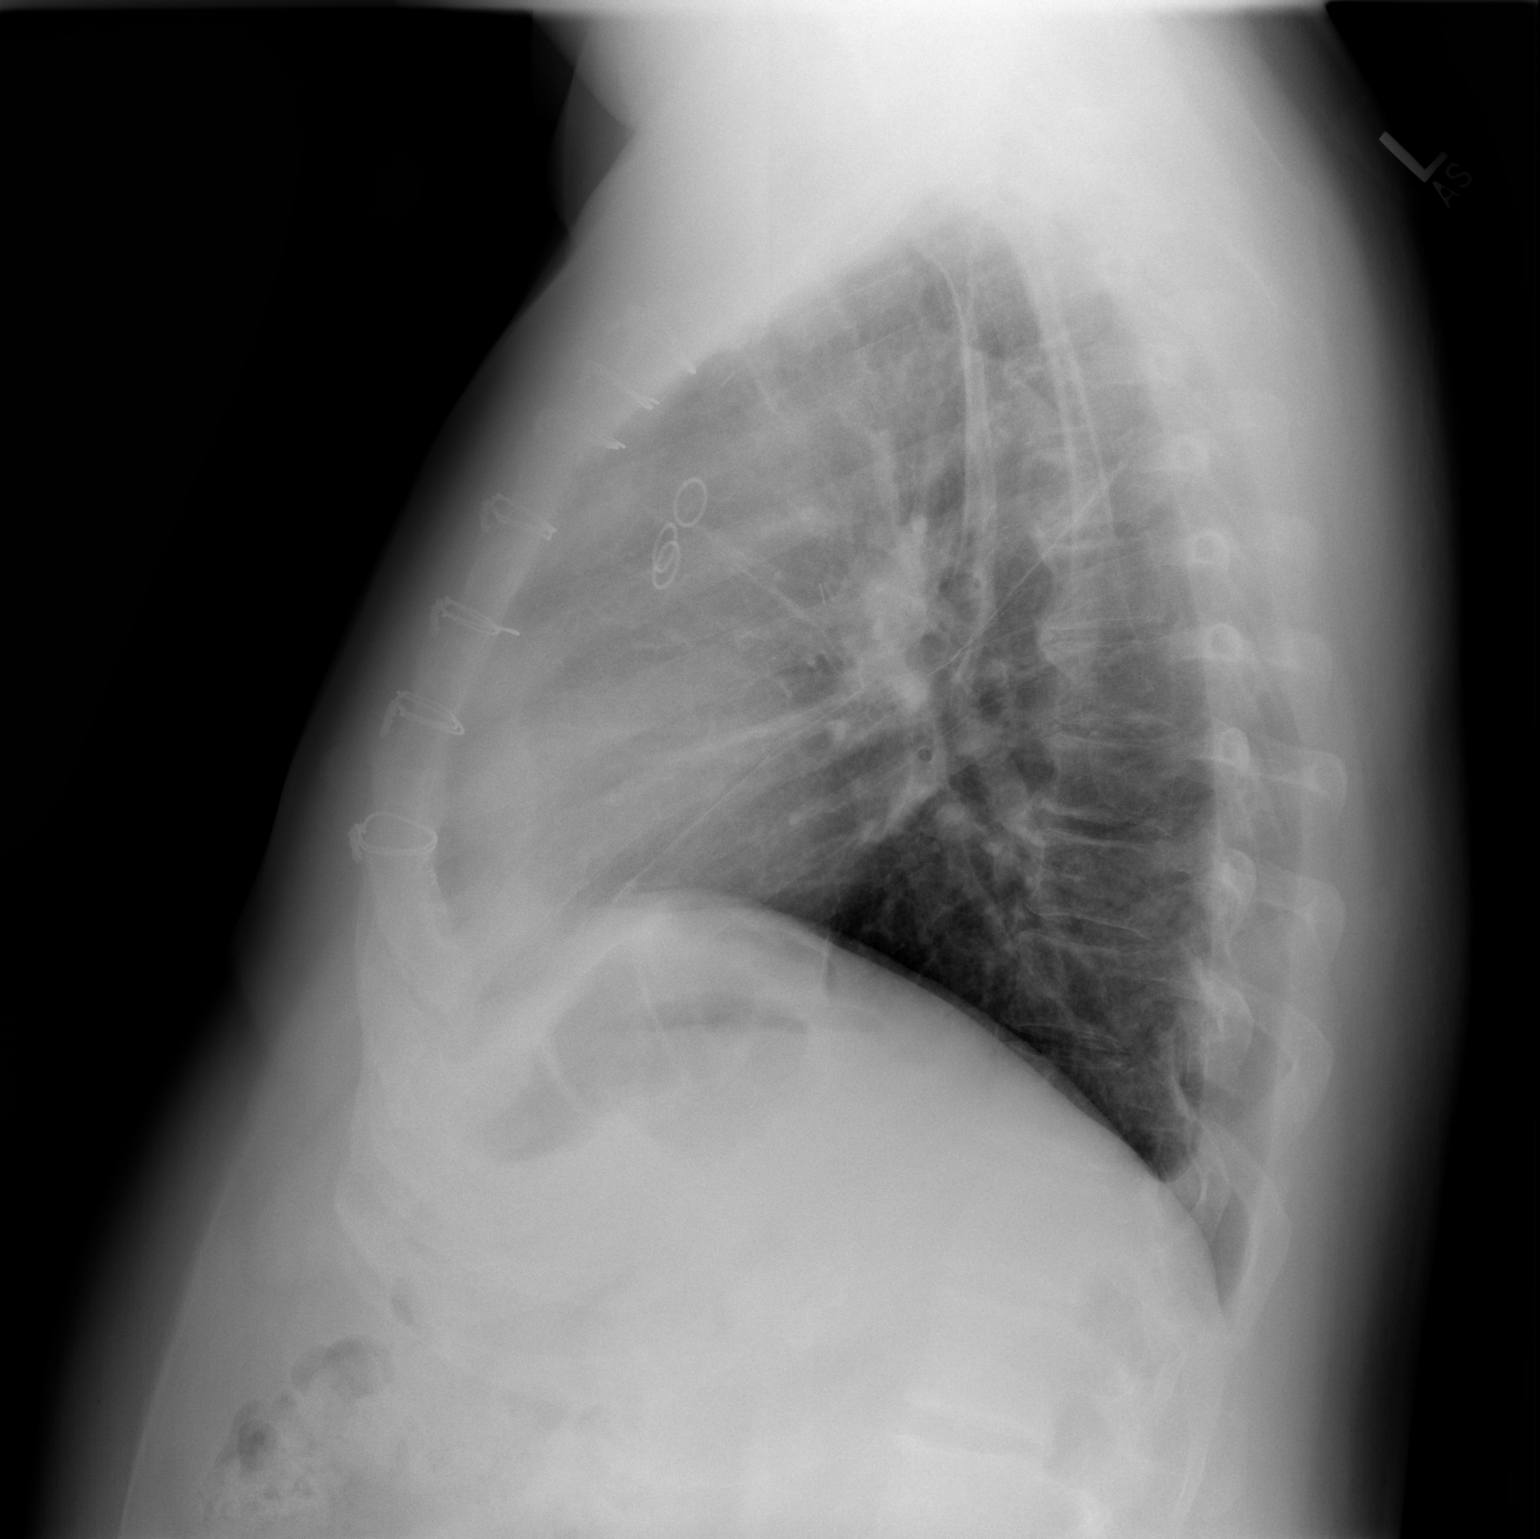

[2 of 2 positions shown; findings below may reference images not displayed]

FINDINGS: Trachea is midline.  Heart size is stable.  Sternotomy
wires are unchanged in position.  There is improving bibasilar
atelectasis.  Trace residual left pleural effusion.  No edema.
IMPRESSION: Trace residual left pleural effusion.  Improving bibasilar
atelectasis.

## 2010-12-08 ENCOUNTER — Encounter: Payer: Self-pay | Admitting: Cardiology

## 2011-03-22 ENCOUNTER — Telehealth: Payer: Self-pay | Admitting: Cardiology

## 2011-03-22 NOTE — Telephone Encounter (Signed)
Patient c/o chest tightness over last several days.  Did not feel that he needed to go to ED.  Scheduled with Dr. Kirke Corin for tomorrow.

## 2011-03-23 ENCOUNTER — Ambulatory Visit: Payer: Self-pay | Admitting: Cardiovascular Disease

## 2011-06-04 ENCOUNTER — Other Ambulatory Visit: Payer: Self-pay | Admitting: *Deleted

## 2011-06-04 MED ORDER — NITROGLYCERIN 0.4 MG SL SUBL: 0.4000 mg | SUBLINGUAL_TABLET | SUBLINGUAL | Status: DC | PRN

## 2012-02-25 ENCOUNTER — Encounter: Payer: Self-pay | Admitting: Physician Assistant

## 2012-02-25 ENCOUNTER — Ambulatory Visit (INDEPENDENT_AMBULATORY_CARE_PROVIDER_SITE_OTHER): Payer: BC Managed Care – PPO | Admitting: Physician Assistant

## 2012-02-25 ENCOUNTER — Encounter: Payer: Self-pay | Admitting: *Deleted

## 2012-02-25 VITALS — BP 108/74 | HR 82 | Ht 70.0 in | Wt 227.0 lb

## 2012-02-25 DIAGNOSIS — I2510 Atherosclerotic heart disease of native coronary artery without angina pectoris: Secondary | ICD-10-CM

## 2012-02-25 DIAGNOSIS — R072 Precordial pain: Secondary | ICD-10-CM

## 2012-02-25 DIAGNOSIS — E785 Hyperlipidemia, unspecified: Secondary | ICD-10-CM

## 2012-02-25 NOTE — Progress Notes (Signed)
Primary Cardiologist: Lewayne Bunting, MD   HPI: Patient presents for long-overdue followup, as well as for evaluation of recent episode of exertional CP.  Patient developed moderately intense (6/10) midsternal chest pain approximately 2 weeks ago, as he was "piddling" around in his yard. He described this as a dull sensation, but with no associated dyspnea, diaphoresis, nausea, or radiation to the jaw or upper extremities. He took a total of 2 NTG tablets, with no relief. Symptoms spontaneously resolved after approximately 3 hours.  Patient has continued to exercise irregular basis, including swimming, with no recurrent chest discomfort.  12-lead EKG today, reviewed by me, indicates NSR 82 bpm; no acute changes  No Known Allergies  Current Outpatient Prescriptions  Medication Sig Dispense Refill  . aspirin 325 MG tablet Take 325 mg by mouth daily.        . fluticasone (FLONASE) 50 MCG/ACT nasal spray Place 2 sprays into the nose daily.        Marland Kitchen loratadine-pseudoephedrine (CLARITIN-D 12-HOUR) 5-120 MG per tablet Take 1 tablet by mouth as needed.       . Multiple Vitamin (MULTIVITAMIN) tablet Take 1 tablet by mouth daily.        . nitroGLYCERIN (NITROSTAT) 0.4 MG SL tablet Place 1 tablet (0.4 mg total) under the tongue every 5 (five) minutes as needed.  25 tablet  0  . oxymetazoline (AFRIN) 0.05 % nasal spray Place 2 sprays into the nose as needed.        . simvastatin (ZOCOR) 10 MG tablet Take 5 mg by mouth every other day. Take on Monday, Wednesday, and Friday        Past Medical History  Diagnosis Date  . Fusion of spine     Cervical vertebra fusion C5 C6 C7  . Nasal polyps 2000    with rhinoplasty  . Carpal tunnel syndrome   . CAD (coronary artery disease)     multivessel/ left main CAD.Marland Kitchen Four-vessel CABG 8/11... Normal left ventricular funciont  . Dyslipidemia     Past Surgical History  Procedure Date  . Cervical laminectomy     and diskectomy  . Ulnar nerve release from left     . Carpal tunnel release     left  . Cataract extraction     Bilateral  . Rhinoplasty     Nasal    History   Social History  . Marital Status: Married    Spouse Name: N/A    Number of Children: N/A  . Years of Education: N/A   Occupational History  . Retired [employer]    Social History Main Topics  . Smoking status: Former Smoker    Types: Cigars  . Smokeless tobacco: Current User    Types: Chew   Comment: He has a remote history of smoking cigars, but he quit doing this several years ago.  . Alcohol Use: Yes     Denies any history of excessive alcohol consumption.  . Drug Use: Not on file  . Sexually Active: Not on file   Other Topics Concern  . Not on file   Social History Narrative   The patient lives in [place] with his wife.Enjoys woodworking and fishing.Has not been limited physically.    Family History  Problem Relation Age of Onset  . Heart attack Father 71  . Heart attack Brother 54    Heavy smoker    ROS: no nausea, vomiting; no fever, chills; no melena, hematochezia; no claudication  PHYSICAL EXAM: BP  108/74  Pulse 82  Ht 5\' 10"  (1.778 m)  Wt 227 lb (102.967 kg)  BMI 32.57 kg/m2 GENERAL: 61 year old male; NAD HEENT: NCAT, PERRLA, EOMI; sclera clear; no xanthelasma NECK: palpable bilateral carotid pulses, no bruits; no JVD; no TM LUNGS: CTA bilaterally CARDIAC: RRR (S1, S2); no significant murmurs; no rubs or gallops ABDOMEN: Protuberant EXTREMETIES: no significant peripheral edema SKIN: warm/dry; no obvious rash/lesions MUSCULOSKELETAL: no joint deformity NEURO: no focal deficit; NL affect   EKG: reviewed and available in Electronic Records   ASSESSMENT & PLAN:  Precordial pain Will evaluate further with an exercise stress Myoview study, to rule out ischemia. Patient has not had an ischemic evaluation, since undergoing 4 vessel CABG, 8/11. Will arrange early clinic followup with me for review of stress test result, and further  recommendations.  DYSLIPIDEMIA Patient has since been taken off high-dose pravastatin, secondary to myalgia. He is currently on very low dose simvastatin. We'll request most recent FLP from Dr. Jeannette How office.    Gene Zelie Asbill, PAC

## 2012-02-25 NOTE — Assessment & Plan Note (Signed)
Will evaluate further with an exercise stress Myoview study, to rule out ischemia. Patient has not had an ischemic evaluation, since undergoing 4 vessel CABG, 8/11. Will arrange early clinic followup with me for review of stress test result, and further recommendations.

## 2012-02-25 NOTE — Patient Instructions (Addendum)
Continue all current medications.  Exercise Stress Myoview  Office will contact with results Follow up in  1 month

## 2012-02-25 NOTE — Assessment & Plan Note (Signed)
Patient has since been taken off high-dose pravastatin, secondary to myalgia. He is currently on very low dose simvastatin. We'll request most recent FLP from Dr. Jeannette How office.

## 2012-02-28 ENCOUNTER — Telehealth: Payer: Self-pay

## 2012-02-28 ENCOUNTER — Other Ambulatory Visit: Payer: Self-pay | Admitting: Physician Assistant

## 2012-02-28 DIAGNOSIS — I2510 Atherosclerotic heart disease of native coronary artery without angina pectoris: Secondary | ICD-10-CM

## 2012-02-28 NOTE — Telephone Encounter (Signed)
Exercise Stress Myoview Weight 227  Diagnosis : 414.01, 786.51  Thursday, September 26th, 2013 [facility]

## 2012-02-29 NOTE — Telephone Encounter (Signed)
Auth # [id number] exp 03/29/12

## 2012-03-02 DIAGNOSIS — R079 Chest pain, unspecified: Secondary | ICD-10-CM

## 2012-03-03 ENCOUNTER — Telehealth: Payer: Self-pay | Admitting: *Deleted

## 2012-03-03 NOTE — Telephone Encounter (Signed)
Notes Recorded by Lesle Chris, LPN on 1/61/0960 at 3:20 PM Patient notified. Scheduled 1 mo follow up for 10/30 with Gene.

## 2012-03-03 NOTE — Telephone Encounter (Signed)
Message copied by Lesle Chris on Fri Mar 03, 2012  3:21 PM ------      Message from: Rande Brunt      Created: Fri Mar 03, 2012 12:54 PM       NL stress test. No further w/u

## 2012-04-05 ENCOUNTER — Encounter: Payer: Self-pay | Admitting: Physician Assistant

## 2012-04-05 ENCOUNTER — Ambulatory Visit (INDEPENDENT_AMBULATORY_CARE_PROVIDER_SITE_OTHER): Payer: BC Managed Care – PPO | Admitting: Physician Assistant

## 2012-04-05 VITALS — BP 124/84 | HR 82 | Ht 70.0 in | Wt 218.0 lb

## 2012-04-05 DIAGNOSIS — I2510 Atherosclerotic heart disease of native coronary artery without angina pectoris: Secondary | ICD-10-CM

## 2012-04-05 DIAGNOSIS — E785 Hyperlipidemia, unspecified: Secondary | ICD-10-CM

## 2012-04-05 MED ORDER — ASPIRIN EC 81 MG PO TBEC: 81.0000 mg | DELAYED_RELEASE_TABLET | Freq: Every day | ORAL | Status: DC

## 2012-04-05 MED ORDER — SIMVASTATIN 20 MG PO TABS: 20.0000 mg | ORAL_TABLET | Freq: Every evening | ORAL | Status: DC

## 2012-04-05 NOTE — Progress Notes (Signed)
Primary Cardiologist: Lewayne Bunting, MD   HPI: Patient returns for early scheduled followup and review of recent exercise stress Myoview study, to rule out significant CAD progression, status post CABG, August, 2011.   - Exercise stress Cardiolite (91% PMHR): Normal perfusion, with normal wall motion  Clinically, patient reports no further CP. He suggests that this may have all been related to "stress". He denies any active reflux symptoms. As previously outlined, he also denies any association of these recent symptoms of chest discomfort, with those which preceded his CABG.  No Known Allergies  Current Outpatient Prescriptions  Medication Sig Dispense Refill  . fluticasone (FLONASE) 50 MCG/ACT nasal spray Place 2 sprays into the nose as needed.       . loratadine-pseudoephedrine (CLARITIN-D 12-HOUR) 5-120 MG per tablet Take 1 tablet by mouth as needed.       . Multiple Vitamin (MULTIVITAMIN) tablet Take 1 tablet by mouth daily.        . nitroGLYCERIN (NITROSTAT) 0.4 MG SL tablet Place 1 tablet (0.4 mg total) under the tongue every 5 (five) minutes as needed.  25 tablet  0  . oxymetazoline (AFRIN) 0.05 % nasal spray Place 2 sprays into the nose as needed.        Marland Kitchen aspirin EC 81 MG tablet Take 1 tablet (81 mg total) by mouth daily.      . simvastatin (ZOCOR) 20 MG tablet Take 1 tablet (20 mg total) by mouth every evening.  30 tablet  6  . DISCONTD: simvastatin (ZOCOR) 10 MG tablet Take 5 mg by mouth every other day. Take on Monday, Wednesday, and Friday        Past Medical History  Diagnosis Date  . Fusion of spine     Cervical vertebra fusion C5 C6 C7  . Nasal polyps 2000    with rhinoplasty  . Carpal tunnel syndrome   . CAD (coronary artery disease)     multivessel/ left main CAD.Marland Kitchen Four-vessel CABG 8/11... Normal left ventricular funciont  . Dyslipidemia     Past Surgical History  Procedure Date  . Cervical laminectomy     and diskectomy  . Ulnar nerve release from left   .  Carpal tunnel release     left  . Cataract extraction     Bilateral  . Rhinoplasty     Nasal    History   Social History  . Marital Status: Married    Spouse Name: N/A    Number of Children: N/A  . Years of Education: N/A   Occupational History  . Retired [employer]    Social History Main Topics  . Smoking status: Former Smoker    Types: Cigars  . Smokeless tobacco: Current User    Types: Chew   Comment: He has a remote history of smoking cigars, but he quit doing this several years ago.  . Alcohol Use: Yes     Denies any history of excessive alcohol consumption.  . Drug Use: Not on file  . Sexually Active: Not on file   Other Topics Concern  . Not on file   Social History Narrative   The patient lives in [place] with his wife.Enjoys woodworking and fishing.Has not been limited physically.    Family History  Problem Relation Age of Onset  . Heart attack Father 2  . Heart attack Brother 15    Heavy smoker    ROS: no nausea, vomiting; no fever, chills; no melena, hematochezia; no claudication  PHYSICAL  EXAM: BP 124/84  Pulse 82  Ht 5\' 10"  (1.778 m)  Wt 218 lb (98.884 kg)  BMI 31.28 kg/m2  SpO2 97% GENERAL: 61 year old male; NAD  HEENT: NCAT, PERRLA, EOMI; sclera clear; no xanthelasma  NECK: palpable bilateral carotid pulses, no bruits; no JVD; no TM  LUNGS: CTA bilaterally  CARDIAC: RRR (S1, S2); no significant murmurs; no rubs or gallops  ABDOMEN: Protuberant  EXTREMETIES: no significant peripheral edema  SKIN: warm/dry; no obvious rash/lesions  MUSCULOSKELETAL: no joint deformity  NEURO: no focal deficit; NL affect    EKG: reviewed and available in Electronic Records   ASSESSMENT & PLAN:  CAD No further workup indicated. Decrease ASA to 81 mg daily. Of note, patient indicates today prior history of occasional dark stools , and I suspect that he may have had some gastritis in the past.  DYSLIPIDEMIA Patient has run out of simvastatin.  Will renew this at 20 mg daily, and reassess lipid status in 12 weeks. Aggressive management recommended with target LDL 70 or less, if feasible.    Gene Chea Malan, PAC

## 2012-04-05 NOTE — Patient Instructions (Signed)
   Decrease Aspirin to 81mg  daily  Increase Simvastatin to 20mg  every evening  Continue all other current medications. Labs - due in 12 weeks for fasting lipid and liver panel - will send reminder in mail  Your physician wants you to follow up in: 6 months.  You will receive a reminder letter in the mail one-two months in advance.  If you don't receive a letter, please call our office to schedule the follow up appointment

## 2012-04-05 NOTE — Assessment & Plan Note (Signed)
No further workup indicated. Decrease ASA to 81 mg daily. Of note, patient indicates today prior history of occasional dark stools , and I suspect that he may have had some gastritis in the past.

## 2012-04-05 NOTE — Assessment & Plan Note (Signed)
Patient has run out of simvastatin. Will renew this at 20 mg daily, and reassess lipid status in 12 weeks. Aggressive management recommended with target LDL 70 or less, if feasible.

## 2012-07-11 ENCOUNTER — Telehealth: Payer: Self-pay | Admitting: *Deleted

## 2012-07-11 ENCOUNTER — Encounter: Payer: Self-pay | Admitting: *Deleted

## 2012-07-11 DIAGNOSIS — Z79899 Other long term (current) drug therapy: Secondary | ICD-10-CM

## 2012-07-11 DIAGNOSIS — I2510 Atherosclerotic heart disease of native coronary artery without angina pectoris: Secondary | ICD-10-CM

## 2012-07-11 DIAGNOSIS — E785 Hyperlipidemia, unspecified: Secondary | ICD-10-CM

## 2012-07-11 NOTE — Telephone Encounter (Signed)
Message copied by Lesle Chris on Tue Jul 11, 2012 11:31 AM ------      Message from: Lesle Chris      Created: Wed Apr 05, 2012  1:59 PM       Repeat fasting lipid and liver function - increase simvastatin on 04/05/2012.

## 2012-07-11 NOTE — Telephone Encounter (Signed)
Order & letter mailed.

## 2012-09-13 ENCOUNTER — Telehealth: Payer: Self-pay | Admitting: *Deleted

## 2012-09-13 MED ORDER — ATORVASTATIN CALCIUM 80 MG PO TABS: 80.0000 mg | ORAL_TABLET | Freq: Every evening | ORAL | Status: DC

## 2012-09-13 NOTE — Telephone Encounter (Signed)
Message copied by Lesle Chris on Wed Sep 13, 2012  4:14 PM ------      Message from: Prescott Parma C      Created: Mon Sep 11, 2012  3:27 PM       LDL 197. DC Zocor and start Lipitor 80 daily. FLP/LFT profile in 12 weeks       ------

## 2012-09-13 NOTE — Telephone Encounter (Signed)
Notes Recorded by Lesle Chris, LPN on 06/12/1094 at 4:14 PM Patient notified and verbalized understanding. Will send new rx to [facility]. Will mail reminder when time to repeat labs.

## 2012-09-21 ENCOUNTER — Telehealth: Payer: Self-pay | Admitting: *Deleted

## 2012-09-21 NOTE — Telephone Encounter (Signed)
Recently d/c Zocor on 09/14/2002 & changed to Lipitor 80mg  every evening.  Began medication on Monday, 4/14.  Last night had really bad acid reflux.  Had a rough night.  Had episode of vomiting this morning.  States afterwards he felt much better, was able to eat lunch, and no problems since.  States he has a hard time tolerating statins.  Patient states that he is willing to go back on the Zocor (maybe at higher dose) as he tolerated this med fine.

## 2012-09-22 NOTE — Telephone Encounter (Signed)
I saw pt in office 03/2012, and increased Zocor to 20 qhs. Pt was not DC'd from [facility] recently, so I don't know who DC'd him and from which hospital. He needs to contact the [facility] team.

## 2012-09-22 NOTE — Telephone Encounter (Signed)
Please review labs dated 4/2.  You changed med based off of those recent labs.  Please advise.

## 2012-09-25 NOTE — Telephone Encounter (Signed)
Left message to return call 

## 2012-09-25 NOTE — Telephone Encounter (Signed)
The problem is that the pt had an LDL of nearly 200, when he was on the Zocor. Therefore, I put him on max dose of Lipitor. He should take this with food, and see if this will reduce his GI upset.

## 2012-10-03 NOTE — Telephone Encounter (Signed)
Left message to return call 

## 2012-10-12 NOTE — Telephone Encounter (Signed)
Discussed below with patient.  Stated he did see his PMD Dimas Aguas) last week & did discuss cholesterol issue with him.  He suggested low dose Crestor 5mg  every evening.  Is due for follow up labs in July.  States he will let PMD manage cholesterol for now.  He will follow back up here as needed if Dimas Aguas feels he needs to.

## 2013-04-03 ENCOUNTER — Encounter: Payer: Self-pay | Admitting: Cardiology

## 2013-04-26 ENCOUNTER — Encounter: Payer: Self-pay | Admitting: Cardiology

## 2013-04-26 ENCOUNTER — Ambulatory Visit (INDEPENDENT_AMBULATORY_CARE_PROVIDER_SITE_OTHER): Payer: BC Managed Care – PPO | Admitting: Cardiology

## 2013-04-26 VITALS — BP 123/75 | HR 53 | Ht 70.0 in | Wt 227.0 lb

## 2013-04-26 DIAGNOSIS — E785 Hyperlipidemia, unspecified: Secondary | ICD-10-CM

## 2013-04-26 DIAGNOSIS — I2510 Atherosclerotic heart disease of native coronary artery without angina pectoris: Secondary | ICD-10-CM

## 2013-04-26 NOTE — Progress Notes (Signed)
Clinical Summary Adrian Gibson is a 62 y.o.male last seen by NP Serpe, he was seen today for the following medical problems.   1. CAD - prior CABG 4 vessel 01/2010 at [facility], normal LVEF at that time. - exercise MPI 02/2012: 10 min, 13 METs, Duke Treadmill score 10 low risk, no perfusion defects.  - mild chest discomfort with high levels of exertion on the left side, led to stress test last year which was normal, this is unchanged Compliant with meds: ASA, crestor (causes joint pains),   2. Hyperlipidemia - has tried multiple different statins. Has worked with Dr Dimas Aguas to find a regimen. Currently taking crestor 5mg  every other day 09/2012 panel: TC 274 TG 196 HDL 38 LDL 197. Reports this was off statin, restarted on statin since then.   Past Medical History  Diagnosis Date  . Fusion of spine     Cervical vertebra fusion C5 C6 C7  . Nasal polyps 2000    with rhinoplasty  . Carpal tunnel syndrome   . CAD (coronary artery disease)     multivessel/ left main CAD.Marland Kitchen Four-vessel CABG 8/11... Normal left ventricular funciont  . Dyslipidemia      No Known Allergies   Current Outpatient Prescriptions  Medication Sig Dispense Refill  . aspirin EC 81 MG tablet Take 1 tablet (81 mg total) by mouth daily.      Marland Kitchen atorvastatin (LIPITOR) 80 MG tablet Take 1 tablet (80 mg total) by mouth every evening.  30 tablet  6  . fluticasone (FLONASE) 50 MCG/ACT nasal spray Place 2 sprays into the nose as needed.       . loratadine-pseudoephedrine (CLARITIN-D 12-HOUR) 5-120 MG per tablet Take 1 tablet by mouth as needed.       . Multiple Vitamin (MULTIVITAMIN) tablet Take 1 tablet by mouth daily.        . nitroGLYCERIN (NITROSTAT) 0.4 MG SL tablet Place 1 tablet (0.4 mg total) under the tongue every 5 (five) minutes as needed.  25 tablet  0  . oxymetazoline (AFRIN) 0.05 % nasal spray Place 2 sprays into the nose as needed.         No current facility-administered medications for this visit.      Past Surgical History  Procedure Laterality Date  . Cervical laminectomy      and diskectomy  . Ulnar nerve release from left    . Carpal tunnel release      left  . Cataract extraction      Bilateral  . Rhinoplasty      Nasal     No Known Allergies    Family History  Problem Relation Age of Onset  . Heart attack Father 52  . Heart attack Brother 57    Heavy smoker     Social History Mr. Jech reports that he has quit smoking. His smoking use included Cigars. His smokeless tobacco use includes Chew. Mr. Bozard reports that he drinks alcohol.   Review of Systems CONSTITUTIONAL: No weight loss, fever, chills, weakness or fatigue.  HEENT: Eyes: No visual loss, blurred vision, double vision or yellow sclerae.No hearing loss, sneezing, congestion, runny nose or sore throat.  SKIN: No rash or itching.  CARDIOVASCULAR: per HPI RESPIRATORY: per HPI  GASTROINTESTINAL: No anorexia, nausea, vomiting or diarrhea. No abdominal pain or blood.  GENITOURINARY: No burning on urination, no polyuria NEUROLOGICAL: No headache, dizziness, syncope, paralysis, ataxia, numbness or tingling in the extremities. No change in bowel or bladder  control.  MUSCULOSKELETAL: occasional muscle and joint pains LYMPHATICS: No enlarged nodes. No history of splenectomy.  PSYCHIATRIC: No history of depression or anxiety.  ENDOCRINOLOGIC: No reports of sweating, cold or heat intolerance. No polyuria or polydipsia.  Marland Kitchen   Physical Examination p 53 bp 123/75 Wt 227 lbs BMI 33 Gen: resting comfortably, no acute distress HEENT: no scleral icterus, pupils equal round and reactive, no palptable cervical adenopathy,  CV: RRR, no m/r/g, no JVD, no carotid bruits Resp: Clear to auscultation bilaterally GI: abdomen is soft, non-tender, non-distended, normal bowel sounds, no hepatosplenomegaly MSK: extremities are warm, no edema.  Skin: warm, no rash Neuro:  no focal deficits Psych: appropriate  affect   Diagnostic Studies Exercise MPI 02/2012: 10 min, 13 METs, Duke Treadmill score 10 low risk, no perfusion defects.      Assessment and Plan  1. CAD - mild symptoms at high levels of exertion, normal stress test with very good functional capacity, low risk Duke score, and no perfusion defects on imaging - continue secondary prevention  2. Hyperlipidemia - followed closely by the patient's PCP Dr. Dimas Aguas, has tried mulitple statin regimens but has been limited due to myalgias currently on crestor. - continue current therapy, defer managemen to Dr Dimas Aguas    Follow up 6 months    Antoine Poche, M.D., F.A.C.C.

## 2013-04-26 NOTE — Patient Instructions (Signed)
Continue all current medications. Your physician wants you to follow up in: 6 months.  You will receive a reminder letter in the mail one-two months in advance.  If you don't receive a letter, please call our office to schedule the follow up appointment   

## 2013-10-02 ENCOUNTER — Telehealth: Payer: Self-pay | Admitting: Cardiology

## 2013-10-02 NOTE — Telephone Encounter (Signed)
10/02/13 - left message to schedule recall appt  and mailed another recall-srs °

## 2013-10-24 ENCOUNTER — Ambulatory Visit (INDEPENDENT_AMBULATORY_CARE_PROVIDER_SITE_OTHER): Payer: 59 | Admitting: Cardiology

## 2013-10-24 VITALS — BP 110/70 | HR 58 | Ht 70.0 in | Wt 217.4 lb

## 2013-10-24 DIAGNOSIS — E785 Hyperlipidemia, unspecified: Secondary | ICD-10-CM

## 2013-10-24 DIAGNOSIS — I2510 Atherosclerotic heart disease of native coronary artery without angina pectoris: Secondary | ICD-10-CM

## 2013-10-24 MED ORDER — AZITHROMYCIN 250 MG PO TABS: ORAL_TABLET | ORAL | Status: DC

## 2013-10-24 NOTE — Progress Notes (Signed)
Clinical Summary Mr. Connors is a 63 y.o.male seen today for follow up of the following medical problems.   1. CAD  - prior CABG 4 vessel 01/2010 at [facility], normal LVEF at that time.  - exercise MPI 02/2012: 10 min, 13 METs, Duke Treadmill score 10 low risk, no perfusion defects.  - mild chest discomfort with high levels of exertion on the left side, led to stress test last year which was normal, this is unchanged he says since CABG in 2011. He states it feels more like muscle pain  2. Hyperlipidemia  - has tried multiple different statins. Has worked with Dr Nadara Mustard to find a regimen. Currently taking crestor 5mg  daily and seems to be tolerating well 09/2012 panel: TC 274 TG 196 HDL 38 LDL 197. Reports this was off statin, restarted on statin since then.  - reports repeat panel pending for July  3. Sinusitis - sinus congestion, pain. Ongoing 4-5 days, no cough. +sinus headache   Past Medical History  Diagnosis Date  . Fusion of spine     Cervical vertebra fusion C5 C6 C7  . Nasal polyps 2000    with rhinoplasty  . Carpal tunnel syndrome   . CAD (coronary artery disease)     multivessel/ left main CAD.Marland Kitchen Four-vessel CABG 8/11... Normal left ventricular funciont  . Dyslipidemia      Allergies  Allergen Reactions  . Atorvastatin Other (See Comments)    myalgia     Current Outpatient Prescriptions  Medication Sig Dispense Refill  . allopurinol (ZYLOPRIM) 100 MG tablet Take 1 tablet by mouth daily.      Marland Kitchen aspirin EC 81 MG tablet Take 1 tablet (81 mg total) by mouth daily.      . CRESTOR 5 MG tablet Take 1 tablet by mouth daily.      . fluticasone (FLONASE) 50 MCG/ACT nasal spray Place 2 sprays into the nose as needed.       . loratadine-pseudoephedrine (CLARITIN-D 12-HOUR) 5-120 MG per tablet Take 1 tablet by mouth as needed.       . Multiple Vitamin (MULTIVITAMIN) tablet Take 1 tablet by mouth daily.        . naproxen (NAPROSYN) 500 MG tablet Take 500 mg by mouth 2  (two) times daily as needed.      . nitroGLYCERIN (NITROSTAT) 0.4 MG SL tablet Place 1 tablet (0.4 mg total) under the tongue every 5 (five) minutes as needed.  25 tablet  0  . oxymetazoline (AFRIN) 0.05 % nasal spray Place 2 sprays into the nose as needed.         No current facility-administered medications for this visit.     Past Surgical History  Procedure Laterality Date  . Cervical laminectomy      and diskectomy  . Ulnar nerve release from left    . Carpal tunnel release      left  . Cataract extraction      Bilateral  . Rhinoplasty      Nasal     Allergies  Allergen Reactions  . Atorvastatin Other (See Comments)    myalgia      Family History  Problem Relation Age of Onset  . Heart attack Father 13  . Heart attack Brother 71    Heavy smoker     Social History Mr. Kumpf reports that he has quit smoking. His smoking use included Cigars. He has quit using smokeless tobacco. His smokeless tobacco use included Chew. Mr.  Maybee reports that he drinks alcohol.   Review of Systems CONSTITUTIONAL: No weight loss, fever, chills, weakness or fatigue.  HEENT: sinus congestion, sinus pressure SKIN: No rash or itching.  CARDIOVASCULAR: per HPI RESPIRATORY: No shortness of breath, cough or sputum.  GASTROINTESTINAL: No anorexia, nausea, vomiting or diarrhea. No abdominal pain or blood.  GENITOURINARY: No burning on urination, no polyuria NEUROLOGICAL: No headache, dizziness, syncope, paralysis, ataxia, numbness or tingling in the extremities. No change in bowel or bladder control.  MUSCULOSKELETAL: No muscle, back pain, joint pain or stiffness.  LYMPHATICS: No enlarged nodes. No history of splenectomy.  PSYCHIATRIC: No history of depression or anxiety.  ENDOCRINOLOGIC: No reports of sweating, cold or heat intolerance. No polyuria or polydipsia.  Marland Kitchen   Physical Examination p 58 bp 110/70 Wt 217 lbs BMI 31 Gen: resting comfortably, no acute distress HEENT: no  scleral icterus, pupils equal round and reactive, no palptable cervical adenopathy,  CV: RRR, no m/r/g,no JVD no carotid bruits Resp: Clear to auscultation bilaterally GI: abdomen is soft, non-tender, non-distended, normal bowel sounds, no hepatosplenomegaly MSK: extremities are warm, no edema.  Skin: warm, no rash Neuro:  no focal deficits Psych: appropriate affect   Diagnostic Studies Exercise MPI 02/2012: 10 min, 13 METs, Duke Treadmill score 10 low risk, no perfusion defects.      Assessment and Plan  1. CAD  - mild somewhat atypical symptoms at high levels of exertion stable since 2011, normal stress test with very good functional capacity, low risk Duke score, and no perfusion defects on imaging  - continue secondary prevention   2. Hyperlipidemia  - followed closely by the patient's PCP Dr. Nadara Mustard, has tried mulitple statin regimens but has been limited due to myalgias currently on crestor.  - continue current therapy, reports repeat panel due in July - goal LDL <70   F/u 1 year    Arnoldo Lenis, M.D., F.A.C.C.

## 2013-10-24 NOTE — Patient Instructions (Signed)
   Zithromax - Z pack  Continue all other medications.   Your physician wants you to follow up in:  1 year.  You will receive a reminder letter in the mail one-two months in advance.  If you don't receive a letter, please call our office to schedule the follow up appointment

## 2014-03-04 ENCOUNTER — Other Ambulatory Visit: Payer: Self-pay | Admitting: Cardiology

## 2015-03-31 ENCOUNTER — Encounter: Payer: Self-pay | Admitting: Cardiology

## 2015-03-31 ENCOUNTER — Ambulatory Visit (INDEPENDENT_AMBULATORY_CARE_PROVIDER_SITE_OTHER): Payer: Medicare Other | Admitting: Cardiology

## 2015-03-31 ENCOUNTER — Encounter: Payer: Self-pay | Admitting: *Deleted

## 2015-03-31 VITALS — BP 109/70 | HR 74 | Ht 70.0 in | Wt 202.4 lb

## 2015-03-31 DIAGNOSIS — I2510 Atherosclerotic heart disease of native coronary artery without angina pectoris: Secondary | ICD-10-CM

## 2015-03-31 DIAGNOSIS — E785 Hyperlipidemia, unspecified: Secondary | ICD-10-CM

## 2015-03-31 NOTE — Progress Notes (Signed)
Patient ID: Adrian Gibson, male   DOB: 07-Aug-1950, 64 y.o.   MRN: [id number]     Clinical Summary Adrian Gibson is a 64 y.o.male seen today for follow up of the following medical problems.   1. CAD  - prior CABG 4 vessel 01/2010 at [facility], normal LVEF at that time.  - exercise MPI 02/2012: 10 min, 13 METs, Duke Treadmill score 10 low risk, no perfusion defects.  - chronic left sided MSK pain ever since CABG that is unchanged.   - swims 3 days a week, does water aerobics as well. Walks regularly with his dog up to 2 miles. - denies any chest pain or SOB  2. Hyperlipidemia  - has tried multiple different statins. Has worked with Dr Nadara Mustard to find a regimen.    Past Medical History  Diagnosis Date  . Fusion of spine     Cervical vertebra fusion C5 C6 C7  . Nasal polyps 2000    with rhinoplasty  . Carpal tunnel syndrome   . CAD (coronary artery disease)     multivessel/ left main CAD.Marland Kitchen Four-vessel CABG 8/11... Normal left ventricular funciont  . Dyslipidemia      Allergies  Allergen Reactions  . Atorvastatin Other (See Comments)    myalgia     Current Outpatient Prescriptions  Medication Sig Dispense Refill  . allopurinol (ZYLOPRIM) 100 MG tablet Take 1 tablet by mouth as needed.     Marland Kitchen aspirin EC 81 MG tablet Take 1 tablet (81 mg total) by mouth daily.    Marland Kitchen azithromycin (ZITHROMAX) 250 MG tablet Take 2 tabs (500mg ) on day 1, then 1 tab (250mg ) on day 2 thru 5 6 each 0  . CRESTOR 5 MG tablet Take 1 tablet by mouth daily.    . fluticasone (FLONASE) 50 MCG/ACT nasal spray Place 2 sprays into the nose as needed.     . loratadine-pseudoephedrine (CLARITIN-D 12-HOUR) 5-120 MG per tablet Take 1 tablet by mouth as needed.     . Multiple Vitamin (MULTIVITAMIN) tablet Take 1 tablet by mouth daily.      . naproxen (NAPROSYN) 500 MG tablet Take 500 mg by mouth 2 (two) times daily as needed.    Marland Kitchen NITROSTAT 0.4 MG SL tablet TAKE 1 TABLET BY MOUTH UNDER TONGUE EVERY 5 MINUTES AS  NEEDED --PT NEEDS OFFICE VISIT PER DR. 25 tablet 3  . oxymetazoline (AFRIN) 0.05 % nasal spray Place 2 sprays into the nose as needed.       No current facility-administered medications for this visit.     Past Surgical History  Procedure Laterality Date  . Cervical laminectomy      and diskectomy  . Ulnar nerve release from left    . Carpal tunnel release      left  . Cataract extraction      Bilateral  . Rhinoplasty      Nasal     Allergies  Allergen Reactions  . Atorvastatin Other (See Comments)    myalgia      Family History  Problem Relation Age of Onset  . Heart attack Father 62  . Heart attack Brother 33    Heavy smoker     Social History Adrian Gibson reports that he has quit smoking. His smoking use included Cigars. He has quit using smokeless tobacco. His smokeless tobacco use included Chew. Adrian Gibson reports that he drinks alcohol.   Review of Systems CONSTITUTIONAL: No weight loss, fever, chills, weakness or fatigue.  HEENT: Eyes: No visual loss, blurred vision, double vision or yellow sclerae.No hearing loss, sneezing, congestion, runny nose or sore throat.  SKIN: No rash or itching.  CARDIOVASCULAR: per HPI RESPIRATORY: No shortness of breath, cough or sputum.  GASTROINTESTINAL: No anorexia, nausea, vomiting or diarrhea. No abdominal pain or blood.  GENITOURINARY: No burning on urination, no polyuria NEUROLOGICAL: No headache, dizziness, syncope, paralysis, ataxia, numbness or tingling in the extremities. No change in bowel or bladder control.  MUSCULOSKELETAL: No muscle, back pain, joint pain or stiffness.  LYMPHATICS: No enlarged nodes. No history of splenectomy.  PSYCHIATRIC: No history of depression or anxiety.  ENDOCRINOLOGIC: No reports of sweating, cold or heat intolerance. No polyuria or polydipsia.  Marland Kitchen   Physical Examination Filed Vitals:   03/31/15 1357  BP: 109/70  Pulse: 74   Filed Vitals:   03/31/15 1357  Height: 5\' 10"   (1.778 m)  Weight: 202 lb 6.4 oz (91.808 kg)    Gen: resting comfortably, no acute distress HEENT: no scleral icterus, pupils equal round and reactive, no palptable cervical adenopathy,  CV: RRR, no m/r/g, no jvd Resp: Clear to auscultation bilaterally GI: abdomen is soft, non-tender, non-distended, normal bowel sounds, no hepatosplenomegaly MSK: extremities are warm, no edema.  Skin: warm, no rash Neuro:  no focal deficits Psych: appropriate affect   Diagnostic Studies Exercise MPI 02/2012: 10 min, 13 METs, Duke Treadmill score 10 low risk, no perfusion defects.     Assessment and Plan   1. CAD  - chronic atypical chest pain that is unchanged with negative stress test 2013 with excellent functional capacity - continue secondary prevention   2. Hyperlipidemia  - followed closely by the patient's PCP Dr. Nadara Mustard, has tried mulitple statin regimens but has been limited due to myalgias currently on crestor.  - continue current therapy    F/u 1 year Arnoldo Lenis, M.D.

## 2015-03-31 NOTE — Patient Instructions (Signed)
Your physician wants you to follow-up in: 1 year with Dr. Branch  You will receive a reminder letter in the mail two months in advance. If you don't receive a letter, please call our office to schedule the follow-up appointment.  Your physician recommends that you continue on your current medications as directed. Please refer to the Current Medication list given to you today.  Thank you for choosing [facility]!!   

## 2016-03-30 ENCOUNTER — Encounter: Payer: Self-pay | Admitting: *Deleted

## 2016-03-31 ENCOUNTER — Encounter: Payer: Self-pay | Admitting: *Deleted

## 2016-03-31 ENCOUNTER — Ambulatory Visit (INDEPENDENT_AMBULATORY_CARE_PROVIDER_SITE_OTHER): Payer: Medicare Other | Admitting: Cardiology

## 2016-03-31 ENCOUNTER — Encounter: Payer: Self-pay | Admitting: Cardiology

## 2016-03-31 VITALS — BP 116/76 | HR 62 | Ht 70.0 in | Wt 225.0 lb

## 2016-03-31 DIAGNOSIS — E782 Mixed hyperlipidemia: Secondary | ICD-10-CM

## 2016-03-31 DIAGNOSIS — I2510 Atherosclerotic heart disease of native coronary artery without angina pectoris: Secondary | ICD-10-CM

## 2016-03-31 DIAGNOSIS — R079 Chest pain, unspecified: Secondary | ICD-10-CM

## 2016-03-31 MED ORDER — NITROGLYCERIN 0.4 MG SL SUBL: SUBLINGUAL_TABLET | SUBLINGUAL | 3 refills | Status: DC

## 2016-03-31 NOTE — Patient Instructions (Signed)
Your physician recommends that you schedule a follow-up appointment TO BE DETERMINED AFTER TESTING   Your physician recommends that you continue on your current medications as directed. Please refer to the Current Medication list given to you today.  Your physician has requested that you have en exercise stress myoview. For further information please visit [email or web address]. Please follow instruction sheet, as given.  Thank you for choosing  [facility]!!   

## 2016-03-31 NOTE — Progress Notes (Signed)
Clinical Summary Adrian Gibson is a 65 y.o.male  seen today for follow up of the following medical problems.   1. CAD  - prior CABG 4 vessel 01/2010 at [facility], normal LVEF at that time.  - exercise MPI 02/2012: 10 min, 13 METs, Duke Treadmill score 10 low risk, no perfusion defects.  - chronic left sided MSK pain ever since CABG that is unchanged.    -recent tightness left chest. No specific trigger.  - last episode while walking, 3-4/10 tightness. No other associated symptoms. Radiated into left shoulder, which was new. Lasted for 10 minutes. Next day heavy exertion without symptoms.  - some SOB with walking up inclines.  - no LE edema, no orthopnea, no PND - compliatn with meds.  2. Hyperlipidemia  - has tried multiple different statins. Has worked with Dr Nadara Mustard to find a regimen.  - reports upcoming labs with pcp   Past Medical History:  Diagnosis Date  . CAD (coronary artery disease)    multivessel/ left main CAD.Marland Kitchen Four-vessel CABG 8/11... Normal left ventricular funciont  . Carpal tunnel syndrome   . Dyslipidemia   . Fusion of spine    Cervical vertebra fusion C5 C6 C7  . Nasal polyps 2000   with rhinoplasty     Allergies  Allergen Reactions  . Atorvastatin Other (See Comments)    myalgia     Current Outpatient Prescriptions  Medication Sig Dispense Refill  . allopurinol (ZYLOPRIM) 100 MG tablet Take 1 tablet by mouth as needed.     Marland Kitchen amoxicillin (AMOXIL) 500 MG capsule Take 1,500 mg by mouth 2 (two) times daily.    Marland Kitchen aspirin EC 81 MG tablet Take 1 tablet (81 mg total) by mouth daily.    . fluticasone (FLONASE) 50 MCG/ACT nasal spray Place 2 sprays into the nose as needed.     Marland Kitchen guaiFENesin (MUCUS RELIEF CHEST CONGESTION) 100 MG/5ML liquid Take 200 mg by mouth as needed for cough.    . loratadine-pseudoephedrine (CLARITIN-D 12-HOUR) 5-120 MG per tablet Take 1 tablet by mouth as needed.     . Multiple Vitamin (MULTIVITAMIN) tablet Take 1 tablet by  mouth daily.      . naproxen (NAPROSYN) 500 MG tablet Take 500 mg by mouth 2 (two) times daily as needed.    Marland Kitchen NITROSTAT 0.4 MG SL tablet TAKE 1 TABLET BY MOUTH UNDER TONGUE EVERY 5 MINUTES AS NEEDED --PT NEEDS OFFICE VISIT PER DR. 25 tablet 3  . oxymetazoline (AFRIN) 0.05 % nasal spray Place 2 sprays into the nose as needed.      . rosuvastatin (CRESTOR) 10 MG tablet Take 3 tablets by mouth daily.     No current facility-administered medications for this visit.      Past Surgical History:  Procedure Laterality Date  . CARPAL TUNNEL RELEASE     left  . CATARACT EXTRACTION     Bilateral  . CERVICAL LAMINECTOMY     and diskectomy  . RHINOPLASTY     Nasal  . Ulnar Nerve Release from Left       Allergies  Allergen Reactions  . Atorvastatin Other (See Comments)    myalgia      Family History  Problem Relation Age of Onset  . Heart attack Father 75  . Heart attack Brother 68    Heavy smoker     Social History Adrian Gibson reports that he has quit smoking. His smoking use included Cigars. He has quit using smokeless  tobacco. His smokeless tobacco use included Chew. Adrian Gibson reports that he drinks alcohol.   Review of Systems CONSTITUTIONAL: No weight loss, fever, chills, weakness or fatigue.  HEENT: Eyes: No visual loss, blurred vision, double vision or yellow sclerae.No hearing loss, sneezing, congestion, runny nose or sore throat.  SKIN: No rash or itching.  CARDIOVASCULAR: per hpi RESPIRATORY: No shortness of breath, cough or sputum.  GASTROINTESTINAL: No anorexia, nausea, vomiting or diarrhea. No abdominal pain or blood.  GENITOURINARY: No burning on urination, no polyuria NEUROLOGICAL: No headache, dizziness, syncope, paralysis, ataxia, numbness or tingling in the extremities. No change in bowel or bladder control.  MUSCULOSKELETAL: No muscle, back pain, joint pain or stiffness.  LYMPHATICS: No enlarged nodes. No history of splenectomy.  PSYCHIATRIC: No history  of depression or anxiety.  ENDOCRINOLOGIC: No reports of sweating, cold or heat intolerance. No polyuria or polydipsia.  Marland Kitchen   Physical Examination Vitals:   03/31/16 0852  BP: 116/76  Pulse: 62   Vitals:   03/31/16 0852  Weight: 225 lb (102.1 kg)  Height: 5\' 10"  (1.778 m)    Gen: resting comfortably, no acute distress HEENT: no scleral icterus, pupils equal round and reactive, no palptable cervical adenopathy,  CV: RRR, no m/r/g, no jvd Resp: Clear to auscultation bilaterally GI: abdomen is soft, non-tender, non-distended, normal bowel sounds, no hepatosplenomegaly MSK: extremities are warm, no edema.  Skin: warm, no rash Neuro:  no focal deficits Psych: appropriate affect   Diagnostic Studies Exercise MPI 02/2012: 10 min, 13 METs, Duke Treadmill score 10 low risk, no perfusion defects.     Assessment and Plan  1. CAD  - recent chest pain symptoms, along with some increased DOE. Symptoms different from the chronic intermittent chest pain he has had previously - EKG in clinic wihtout acute ischemic chanes - we will obtain an exercise nuclear stress test.   2. Hyperlipidemia  - followed closely by the patient's PCP Dr. Nadara Mustard, has tried mulitple statin regimens but has been limited due to myalgias currently on crestor.  - continue current therapy - request labs from pcp      Adrian Gibson, M.D.

## 2016-04-13 ENCOUNTER — Encounter (HOSPITAL_COMMUNITY)
Admission: RE | Admit: 2016-04-13 | Discharge: 2016-04-13 | Disposition: A | Discharge status: Home / Self Care | Payer: Medicare Other | Source: Ambulatory Visit | Attending: Cardiology | Admitting: Cardiology

## 2016-04-13 ENCOUNTER — Encounter (HOSPITAL_COMMUNITY): Payer: Self-pay

## 2016-04-13 ENCOUNTER — Inpatient Hospital Stay (HOSPITAL_COMMUNITY): Admission: RE | Admit: 2016-04-13 | Payer: Medicare Other | Source: Ambulatory Visit

## 2016-04-13 DIAGNOSIS — R079 Chest pain, unspecified: Secondary | ICD-10-CM

## 2016-04-13 LAB — NM MYOCAR MULTI W/SPECT W/WALL MOTION / EF
CHL CUP NUCLEAR SSS: 3
CHL CUP RESTING HR STRESS: 57 {beats}/min
CHL RATE OF PERCEIVED EXERTION: 13
CSEPEDS: 0 s
Estimated workload: 10.4 METS
Exercise duration (min): 9 min
LV dias vol: 74 mL (ref 62–150)
LVSYSVOL: 24 mL
MPHR: 153 {beats}/min
NUC STRESS TID: 0.8
Peak HR: 137 {beats}/min
Percent HR: 89 %
RATE: 0.37
SDS: 2
SRS: 1

## 2016-04-13 MED ORDER — TECHNETIUM TC 99M TETROFOSMIN IV KIT
30.0000 | PACK | Freq: Once | INTRAVENOUS | Status: AC | PRN
  Administered 2016-04-13: 30.2 via INTRAVENOUS

## 2016-04-13 MED ORDER — REGADENOSON 0.4 MG/5ML IV SOLN
INTRAVENOUS | Status: AC
  Filled 2016-04-13: qty 5

## 2016-04-13 MED ORDER — SODIUM CHLORIDE 0.9% FLUSH
INTRAVENOUS | Status: AC
  Administered 2016-04-13: 10 mL via INTRAVENOUS
  Filled 2016-04-13: qty 10

## 2016-04-13 MED ORDER — TECHNETIUM TC 99M TETROFOSMIN IV KIT
10.0000 | PACK | Freq: Once | INTRAVENOUS | Status: AC | PRN
  Administered 2016-04-13: 10.5 via INTRAVENOUS

## 2016-04-15 ENCOUNTER — Telehealth: Payer: Self-pay | Admitting: Cardiology

## 2016-04-15 NOTE — Telephone Encounter (Signed)
Called asking about test results

## 2016-04-15 NOTE — Telephone Encounter (Signed)
Pt aware - routed to pcp - recall placed for 6 months

## 2016-09-28 ENCOUNTER — Ambulatory Visit (INDEPENDENT_AMBULATORY_CARE_PROVIDER_SITE_OTHER): Payer: Medicare Other | Admitting: Cardiology

## 2016-09-28 ENCOUNTER — Encounter: Payer: Self-pay | Admitting: Cardiology

## 2016-09-28 VITALS — BP 120/72 | HR 68 | Ht 69.0 in | Wt 226.7 lb

## 2016-09-28 DIAGNOSIS — E782 Mixed hyperlipidemia: Secondary | ICD-10-CM | POA: Diagnosis not present

## 2016-09-28 DIAGNOSIS — I2510 Atherosclerotic heart disease of native coronary artery without angina pectoris: Secondary | ICD-10-CM

## 2016-09-28 NOTE — Patient Instructions (Signed)

## 2016-09-28 NOTE — Progress Notes (Signed)
Clinical Summary Mr. Sellitto is a 66 y.o.male seen today for follow up of the following medical problems.   1. CAD  - prior CABG 4 vessel 01/2010 at [facility], normal LVEF at that time.  - exercise MPI 02/2012: 10 min, 13 METs, Duke Treadmill score 10 low risk, no perfusion defects.  - 04/2016 nuclear stress: no ischemia - chronic left sided MSK pain ever since CABG    - works out 4-5 days a week. Swims at [facility] and does water aerobics. No symptoms with activities - taken meds daily.   2. Hyperlipidemia  - has tried multiple different statins. Has worked with Dr Nadara Mustard to find a regimen.  - tolerating crestor 20mg  daily.  08/2016 TC 192 TG 132 HDL 42 LDL 124   3. Fall - recent fall, broke nose and spleen contusion    Past Medical History:  Diagnosis Date  . CAD (coronary artery disease)    multivessel/ left main CAD.Marland Kitchen Four-vessel CABG 8/11... Normal left ventricular funciont  . Carpal tunnel syndrome   . Dyslipidemia   . Fusion of spine    Cervical vertebra fusion C5 C6 C7  . Nasal polyps 2000   with rhinoplasty     Allergies  Allergen Reactions  . Atorvastatin Other (See Comments)    myalgia     Current Outpatient Prescriptions  Medication Sig Dispense Refill  . allopurinol (ZYLOPRIM) 100 MG tablet Take 1 tablet by mouth as needed.     Marland Kitchen amoxicillin (AMOXIL) 500 MG capsule Take 1,500 mg by mouth 2 (two) times daily.    Marland Kitchen aspirin EC 81 MG tablet Take 1 tablet (81 mg total) by mouth daily.    . Colchicine 0.6 MG CAPS Take 1 capsule by mouth 2 (two) times daily as needed.    . fluticasone (FLONASE) 50 MCG/ACT nasal spray Place 2 sprays into the nose as needed.     Marland Kitchen guaiFENesin (MUCUS RELIEF CHEST CONGESTION) 100 MG/5ML liquid Take 200 mg by mouth as needed for cough.    . loratadine-pseudoephedrine (CLARITIN-D 12-HOUR) 5-120 MG per tablet Take 1 tablet by mouth as needed.     . Multiple Vitamin (MULTIVITAMIN) tablet Take 1 tablet by mouth daily.      .  naproxen (NAPROSYN) 500 MG tablet Take 500 mg by mouth 2 (two) times daily as needed.    . nitroGLYCERIN (NITROSTAT) 0.4 MG SL tablet TAKE 1 TABLET BY MOUTH UNDER TONGUE EVERY 5 MINUTES AS NEEDED 25 tablet 3  . oxymetazoline (AFRIN) 0.05 % nasal spray Place 2 sprays into the nose as needed.      . rosuvastatin (CRESTOR) 20 MG tablet Take 20-40 mg by mouth daily.     No current facility-administered medications for this visit.      Past Surgical History:  Procedure Laterality Date  . CARPAL TUNNEL RELEASE     left  . CATARACT EXTRACTION     Bilateral  . CERVICAL LAMINECTOMY     and diskectomy  . RHINOPLASTY     Nasal  . Ulnar Nerve Release from Left       Allergies  Allergen Reactions  . Atorvastatin Other (See Comments)    myalgia      Family History  Problem Relation Age of Onset  . Heart attack Father 66  . Heart attack Brother 66    Heavy smoker     Social History Mr. Boyadjian reports that he quit smoking about 31 years ago. His smoking use  included Cigars. He started smoking about 45 years ago. He has a 3.75 pack-year smoking history. He has quit using smokeless tobacco. His smokeless tobacco use included Chew. Mr. Spilde reports that he drinks alcohol.   Review of Systems CONSTITUTIONAL: No weight loss, fever, chills, weakness or fatigue.  HEENT: Eyes: No visual loss, blurred vision, double vision or yellow sclerae.No hearing loss, sneezing, congestion, runny nose or sore throat.  SKIN: No rash or itching.  CARDIOVASCULAR: per hpi RESPIRATORY: No shortness of breath, cough or sputum.  GASTROINTESTINAL: No anorexia, nausea, vomiting or diarrhea. No abdominal pain or blood.  GENITOURINARY: No burning on urination, no polyuria NEUROLOGICAL: No headache, dizziness, syncope, paralysis, ataxia, numbness or tingling in the extremities. No change in bowel or bladder control.  MUSCULOSKELETAL: No muscle, back pain, joint pain or stiffness.  LYMPHATICS: No enlarged  nodes. No history of splenectomy.  PSYCHIATRIC: No history of depression or anxiety.  ENDOCRINOLOGIC: No reports of sweating, cold or heat intolerance. No polyuria or polydipsia.  Marland Kitchen   Physical Examination Vitals:   09/28/16 0855  BP: 120/72  Pulse: 68   Vitals:   09/28/16 0855  Weight: 226 lb 11.2 oz (102.8 kg)  Height: 5\' 9"  (1.753 m)    Gen: resting comfortably, no acute distress HEENT: no scleral icterus, pupils equal round and reactive, no palptable cervical adenopathy,  CV: RRR, no m/r/g, no jvd Resp: Clear to auscultation bilaterally GI: abdomen is soft, non-tender, non-distended, normal bowel sounds, no hepatosplenomegaly MSK: extremities are warm, no edema.  Skin: warm, no rash Neuro:  no focal deficits Psych: appropriate affect   Diagnostic Studies Exercise MPI 02/2012: 10 min, 13 METs, Duke Treadmill score 10 low risk, no perfusion defects  04/2016 Nuclear stress  Blood pressure demonstrated a normal response to exercise.  There was no ST segment deviation noted during stress.  No T wave inversion was noted during stress.  The study is normal.  This is a low risk study.  The left ventricular ejection fraction is hyperdynamic (>65%).   Normal resting and stress perfusion. No ischemia or infarction EF 67%   Assessment and Plan  1. CAD  - no recent symptoms - continue current meds  2. Hyperlipidemia  - followed closely by the patient's PCP Dr. Nadara Mustard, has tried mulitple statin regimens but has been limited due to myalgias currently on crestor.  - continue current meds   F/u 6 months   Arnoldo Lenis, M.D.      Arnoldo Lenis, M.D., F.A.C.C.

## 2017-03-09 ENCOUNTER — Encounter: Payer: Self-pay | Admitting: Cardiology

## 2017-03-09 ENCOUNTER — Telehealth: Payer: Self-pay | Admitting: Cardiology

## 2017-03-09 ENCOUNTER — Ambulatory Visit (INDEPENDENT_AMBULATORY_CARE_PROVIDER_SITE_OTHER): Payer: Medicare Other | Admitting: Cardiology

## 2017-03-09 VITALS — BP 150/88 | HR 72 | Ht 69.0 in | Wt 220.0 lb

## 2017-03-09 DIAGNOSIS — R030 Elevated blood-pressure reading, without diagnosis of hypertension: Secondary | ICD-10-CM

## 2017-03-09 DIAGNOSIS — E782 Mixed hyperlipidemia: Secondary | ICD-10-CM | POA: Diagnosis not present

## 2017-03-09 DIAGNOSIS — Z136 Encounter for screening for cardiovascular disorders: Secondary | ICD-10-CM | POA: Diagnosis not present

## 2017-03-09 DIAGNOSIS — I2510 Atherosclerotic heart disease of native coronary artery without angina pectoris: Secondary | ICD-10-CM

## 2017-03-09 NOTE — Patient Instructions (Signed)
Medication Instructions:  Continue all current medications.  Labwork: none  Testing/Procedures:  Your physician has requested that you have an abdominal aorta duplex. During this test, an ultrasound is used to evaluate the aorta. Allow 30 minutes for this exam. Do not eat after midnight the day before and avoid carbonated beverages  Office will contact with results via phone or letter.    Follow-Up: Your physician wants you to follow up in:  1 year.  You will receive a reminder letter in the mail one-two months in advance.  If you don't receive a letter, please call our office to schedule the follow up appointment   Any Other Special Instructions Will Be Listed Below (If Applicable).  If you need a refill on your cardiac medications before your next appointment, please call your pharmacy.  

## 2017-03-09 NOTE — Telephone Encounter (Signed)
Pre-cert Verification for the following procedure   AAA scheduled for 03-17-17

## 2017-03-09 NOTE — Progress Notes (Signed)
Clinical Summary Mr. Coffield is a 66 y.o.male seen today for follow up of the following medical problems.   1. CAD  - prior CABG 4 vessel 01/2010 at [facility], normal LVEF at that time.  - exercise MPI 02/2012: 10 min, 13 METs, Duke Treadmill score 10 low risk, no perfusion defects.  - 04/2016 nuclear stress: no ischemia - chronic left sided MSK pain ever since CABG    - denies any chest pain. Can have some tightness when first startes exercising, resolves with increased activity.  -compliant with meds  2. Hyperlipidemia  - has tried multiple different statins. Has worked with Dr Nadara Mustard to find a regimen.  - tolerating crestor 20mg  daily.  08/2016 TC 192 TG 132 HDL 42 LDL 124  - at times holds crestor for a few days due to muscle aches, otherwise compliant   3. Elevated bps - no diagnosis of HTN - taking ibuprofen recently, ongoing pain.  - home bp's 120s/70s.   Past Medical History:  Diagnosis Date  . CAD (coronary artery disease)    multivessel/ left main CAD.Marland Kitchen Four-vessel CABG 8/11... Normal left ventricular funciont  . Carpal tunnel syndrome   . Dyslipidemia   . Fusion of spine    Cervical vertebra fusion C5 C6 C7  . Nasal polyps 2000   with rhinoplasty     Allergies  Allergen Reactions  . Atorvastatin Other (See Comments)    myalgia     Current Outpatient Prescriptions  Medication Sig Dispense Refill  . allopurinol (ZYLOPRIM) 100 MG tablet Take 1 tablet by mouth as needed.     Marland Kitchen amoxicillin-clavulanate (AUGMENTIN) 500-125 MG tablet TAKE 1 TABLET BY MOUTH 3 TIMES A DAY FOR 10 DAYS  0  . aspirin EC 81 MG tablet Take 1 tablet (81 mg total) by mouth daily.    . Colchicine 0.6 MG CAPS Take 1 capsule by mouth 2 (two) times daily as needed.    . fluticasone (FLONASE) 50 MCG/ACT nasal spray Place 2 sprays into the nose as needed.     Marland Kitchen guaiFENesin (MUCUS RELIEF CHEST CONGESTION) 100 MG/5ML liquid Take 200 mg by mouth as needed for cough.    .  loratadine-pseudoephedrine (CLARITIN-D 12-HOUR) 5-120 MG per tablet Take 1 tablet by mouth as needed.     . Multiple Vitamin (MULTIVITAMIN) tablet Take 1 tablet by mouth daily.      . naproxen (NAPROSYN) 500 MG tablet Take 500 mg by mouth 2 (two) times daily as needed.    . nitroGLYCERIN (NITROSTAT) 0.4 MG SL tablet TAKE 1 TABLET BY MOUTH UNDER TONGUE EVERY 5 MINUTES AS NEEDED 25 tablet 3  . oxymetazoline (AFRIN) 0.05 % nasal spray Place 2 sprays into the nose as needed.      . predniSONE (STERAPRED UNI-PAK 21 TAB) 5 MG (21) TBPK tablet TAKE AS DIRECTED PER PACKAGE INSTRUCTIONS  0  . rosuvastatin (CRESTOR) 20 MG tablet Take 20-40 mg by mouth daily.     No current facility-administered medications for this visit.      Past Surgical History:  Procedure Laterality Date  . CARPAL TUNNEL RELEASE     left  . CATARACT EXTRACTION     Bilateral  . CERVICAL LAMINECTOMY     and diskectomy  . RHINOPLASTY     Nasal  . Ulnar Nerve Release from Left       Allergies  Allergen Reactions  . Atorvastatin Other (See Comments)    myalgia  Family History  Problem Relation Age of Onset  . Heart attack Father 14  . Heart attack Brother 35       Heavy smoker     Social History Mr. Doverspike reports that he quit smoking about 31 years ago. His smoking use included Cigars. He started smoking about 45 years ago. He has a 3.75 pack-year smoking history. He has quit using smokeless tobacco. His smokeless tobacco use included Chew. Mr. Sparr reports that he drinks alcohol.   Review of Systems CONSTITUTIONAL: No weight loss, fever, chills, weakness or fatigue.  HEENT: Eyes: No visual loss, blurred vision, double vision or yellow sclerae.No hearing loss, sneezing, congestion, runny nose or sore throat.  SKIN: No rash or itching.  CARDIOVASCULAR: per hpi RESPIRATORY: No shortness of breath, cough or sputum.  GASTROINTESTINAL: No anorexia, nausea, vomiting or diarrhea. No abdominal pain or  blood.  GENITOURINARY: No burning on urination, no polyuria NEUROLOGICAL: No headache, dizziness, syncope, paralysis, ataxia, numbness or tingling in the extremities. No change in bowel or bladder control.  MUSCULOSKELETAL: No muscle, back pain, joint pain or stiffness.  LYMPHATICS: No enlarged nodes. No history of splenectomy.  PSYCHIATRIC: No history of depression or anxiety.  ENDOCRINOLOGIC: No reports of sweating, cold or heat intolerance. No polyuria or polydipsia.  Marland Kitchen   Physical Examination Vitals:   03/09/17 0900  BP: (!) 150/88  Pulse: 72  SpO2: 98%   Vitals:   03/09/17 0900  Weight: 220 lb (99.8 kg)  Height: 5\' 9"  (1.753 m)    Gen: resting comfortably, no acute distress HEENT: no scleral icterus, pupils equal round and reactive, no palptable cervical adenopathy,  CV: RRR, no m/r/g, no jvd Resp: Clear to auscultation bilaterally GI: abdomen is soft, non-tender, non-distended, normal bowel sounds, no hepatosplenomegaly MSK: extremities are warm, no edema.  Skin: warm, no rash Neuro:  no focal deficits Psych: appropriate affect   Diagnostic Studies Exercise MPI 02/2012: 10 min, 13 METs, Duke Treadmill score 10 low risk, no perfusion defects  04/2016 Nuclear stress  Blood pressure demonstrated a normal response to exercise.  There was no ST segment deviation noted during stress.  No T wave inversion was noted during stress.  The study is normal.  This is a low risk study.  The left ventricular ejection fraction is hyperdynamic (>65%).  Normal resting and stress perfusion. No ischemia or infarction EF 67%     Assessment and Plan  1.  CAD  - no symptoms, continue current meds - EKG in clinic shows SR, no ischemic changes  2. Hyperlipidemia  - followed closely by the patient's PCP Dr. Nadara Mustard, has tried mulitple statin regimens but has been limited due to myalgias currently on crestor.  - he will continue current statin  3. Elevated blood  pressure - isolated elevated bp, home numbers at goal. Ongoing dental pain and ibuprofen use likely etiology - continue to monitor.   4. AAA screen - male over 66 with tobacco history, order AAA [place]   F/u 1 year     Arnoldo Lenis, M.D.

## 2017-03-17 ENCOUNTER — Ambulatory Visit (INDEPENDENT_AMBULATORY_CARE_PROVIDER_SITE_OTHER): Payer: Medicare Other

## 2017-03-17 ENCOUNTER — Telehealth: Payer: Self-pay | Admitting: *Deleted

## 2017-03-17 DIAGNOSIS — Z136 Encounter for screening for cardiovascular disorders: Secondary | ICD-10-CM | POA: Diagnosis not present

## 2017-03-17 NOTE — Telephone Encounter (Signed)
-----   Message from Herminio Commons, MD sent at 03/17/2017 10:52 AM EDT ----- No aneurysm.

## 2017-03-17 NOTE — Telephone Encounter (Signed)
Pt aware - routed to pcp  

## 2018-03-13 ENCOUNTER — Encounter: Payer: Self-pay | Admitting: Cardiology

## 2018-03-13 ENCOUNTER — Ambulatory Visit: Payer: Medicare Other | Admitting: Cardiology

## 2018-03-13 VITALS — BP 120/80 | HR 60 | Ht 69.0 in | Wt 228.4 lb

## 2018-03-13 DIAGNOSIS — E782 Mixed hyperlipidemia: Secondary | ICD-10-CM

## 2018-03-13 DIAGNOSIS — Z23 Encounter for immunization: Secondary | ICD-10-CM

## 2018-03-13 DIAGNOSIS — I2510 Atherosclerotic heart disease of native coronary artery without angina pectoris: Secondary | ICD-10-CM | POA: Diagnosis not present

## 2018-03-13 NOTE — Patient Instructions (Signed)

## 2018-03-13 NOTE — Progress Notes (Signed)
Clinical Summary Adrian Gibson is a 67 y.o.male seen today for follow up of the following medical problems.   1. CAD  - prior CABG 4 vessel 01/2010 at [facility], normal LVEF at that time.  - exercise MPI 02/2012: 10 min, 13 METs, Duke Treadmill score 10 low risk, no perfusion defects.  - 04/2016 nuclear stress: no ischemia - chronic left sided MSK pain ever since CABG     - just occasional pain at times, nonspecific and chronic. No specific exertional symptoms.  - compliant with meds   2. Hyperlipidemia  - has tried multiple different statins. Has worked with Dr Nadara Mustard to find a regimen.  - tolerating crestor 20mg  daily.  08/2016 TC 192 TG 132 HDL 42 LDL 124  - reports recent labs with pcp, compliant with statin.      - home bp's typically around 110-120/70-80   Past Medical History:  Diagnosis Date  . CAD (coronary artery disease)    multivessel/ left main CAD.Marland Kitchen Four-vessel CABG 8/11... Normal left ventricular funciont  . Carpal tunnel syndrome   . Dyslipidemia   . Fusion of spine    Cervical vertebra fusion C5 C6 C7  . Nasal polyps 2000   with rhinoplasty     Allergies  Allergen Reactions  . Atorvastatin Other (See Comments)    myalgia     Current Outpatient Medications  Medication Sig Dispense Refill  . allopurinol (ZYLOPRIM) 100 MG tablet Take 1 tablet by mouth as needed.     Marland Kitchen amoxicillin-clavulanate (AUGMENTIN) 500-125 MG tablet TAKE 1 TABLET BY MOUTH 3 TIMES A DAY FOR 10 DAYS  0  . aspirin EC 81 MG tablet Take 1 tablet (81 mg total) by mouth daily.    . Colchicine 0.6 MG CAPS Take 1 capsule by mouth 2 (two) times daily as needed.    . fluticasone (FLONASE) 50 MCG/ACT nasal spray Place 2 sprays into the nose as needed.     Marland Kitchen guaiFENesin (MUCUS RELIEF CHEST CONGESTION) 100 MG/5ML liquid Take 200 mg by mouth as needed for cough.    . loratadine-pseudoephedrine (CLARITIN-D 12-HOUR) 5-120 MG per tablet Take 1 tablet by mouth as needed.     .  Multiple Vitamin (MULTIVITAMIN) tablet Take 1 tablet by mouth daily.      . naproxen (NAPROSYN) 500 MG tablet Take 500 mg by mouth 2 (two) times daily as needed.    . nitroGLYCERIN (NITROSTAT) 0.4 MG SL tablet TAKE 1 TABLET BY MOUTH UNDER TONGUE EVERY 5 MINUTES AS NEEDED 25 tablet 3  . oxymetazoline (AFRIN) 0.05 % nasal spray Place 2 sprays into the nose as needed.      . rosuvastatin (CRESTOR) 20 MG tablet Take 20-40 mg by mouth daily.     No current facility-administered medications for this visit.         Allergies  Allergen Reactions  . Atorvastatin Other (See Comments)    myalgia      Family History  Problem Relation Age of Onset  . Heart attack Father 13  . Heart attack Brother 62       Heavy smoker     Social History Mr. Aikey reports that he quit smoking about 32 years ago. His smoking use included cigars. He started smoking about 46 years ago. He has a 3.75 pack-year smoking history. He has quit using smokeless tobacco.  His smokeless tobacco use included chew. Mr. Peel reports that he drinks alcohol.   Review of Systems CONSTITUTIONAL: No  weight loss, fever, chills, weakness or fatigue.  HEENT: Eyes: No visual loss, blurred vision, double vision or yellow sclerae.No hearing loss, sneezing, congestion, runny nose or sore throat.  SKIN: No rash or itching.  CARDIOVASCULAR: per hpi RESPIRATORY: No shortness of breath, cough or sputum.  GASTROINTESTINAL: No anorexia, nausea, vomiting or diarrhea. No abdominal pain or blood.  GENITOURINARY: No burning on urination, no polyuria NEUROLOGICAL: No headache, dizziness, syncope, paralysis, ataxia, numbness or tingling in the extremities. No change in bowel or bladder control.  MUSCULOSKELETAL: No muscle, back pain, joint pain or stiffness.  LYMPHATICS: No enlarged nodes. No history of splenectomy.  PSYCHIATRIC: No history of depression or anxiety.  ENDOCRINOLOGIC: No reports of sweating, cold or heat intolerance. No  polyuria or polydipsia.  Marland Kitchen   Physical Examination Vitals:   03/13/18 1029  BP: 120/80  Pulse: 60  SpO2: 98%   Vitals:   03/13/18 1029  Weight: 228 lb 6.4 oz (103.6 kg)  Height: 5\' 9"  (1.753 m)    Gen: resting comfortably, no acute distress HEENT: no scleral icterus, pupils equal round and reactive, no palptable cervical adenopathy,  CV: RRR, no m/r/g, no jvd Resp: Clear to auscultation bilaterally GI: abdomen is soft, non-tender, non-distended, normal bowel sounds, no hepatosplenomegaly MSK: extremities are warm, no edema.  Skin: warm, no rash Neuro:  no focal deficits Psych: appropriate affect   Diagnostic Studies Exercise MPI 02/2012: 10 min, 13 METs, Duke Treadmill score 10 low risk, no perfusion defects  04/2016 Nuclear stress  Blood pressure demonstrated a normal response to exercise.  There was no ST segment deviation noted during stress.  No T wave inversion was noted during stress.  The study is normal.  This is a low risk study.  The left ventricular ejection fraction is hyperdynamic (>65%).  Normal resting and stress perfusion. No ischemia or infarction EF 67%   03/2017 AAA screen Final Interpretation: Abdominal Aorta: No evidence of an abdominal aortic aneurysm was visualized. The largest aortic measurement is 2.5 cm. IVC is patent.  Assessment and Plan  1.  CAD  - doing well, continue current meds - EKG today SR, no acute ischemic changes.   2. Hyperlipidemia  - followed closely by the patient's PCP Dr. Nadara Mustard, has tried mulitple statin regimens but has been limited due to myalgias currently on crestor.  - request labs from pcp, continue statin  F/u 1 year     Arnoldo Lenis, M.D.

## 2018-06-07 DIAGNOSIS — C801 Malignant (primary) neoplasm, unspecified: Secondary | ICD-10-CM

## 2018-06-07 HISTORY — DX: Malignant (primary) neoplasm, unspecified: C80.1

## 2018-07-06 ENCOUNTER — Encounter: Payer: Self-pay | Admitting: Internal Medicine

## 2018-09-07 ENCOUNTER — Other Ambulatory Visit: Payer: Self-pay

## 2018-09-07 ENCOUNTER — Encounter: Payer: Self-pay | Admitting: Gastroenterology

## 2018-09-07 ENCOUNTER — Ambulatory Visit: Payer: Medicare Other | Admitting: Gastroenterology

## 2018-09-07 VITALS — BP 128/87 | HR 75 | Temp 97.0°F | Ht 69.5 in | Wt 222.4 lb

## 2018-09-07 DIAGNOSIS — K649 Unspecified hemorrhoids: Secondary | ICD-10-CM | POA: Insufficient documentation

## 2018-09-07 DIAGNOSIS — R1310 Dysphagia, unspecified: Secondary | ICD-10-CM | POA: Diagnosis not present

## 2018-09-07 DIAGNOSIS — K642 Third degree hemorrhoids: Secondary | ICD-10-CM

## 2018-09-07 MED ORDER — PANTOPRAZOLE SODIUM 40 MG PO TBEC: 40.0000 mg | DELAYED_RELEASE_TABLET | Freq: Every day | ORAL | 3 refills | Status: DC

## 2018-09-07 NOTE — Patient Instructions (Signed)
We have arranged an upper endoscopy with dilation by Dr. Gala Romney in the near future.  Continue Protonix once each morning, 30 minutes before breakfast.  You may have sensation of pressure today after banding, but you should not have pain. If so, please call. Your banding felt to be in great position, so I do not anticipate any issues. We banded what was one of your larger internal hemorrhoids. You will need subsequent banding of remaining columns in the future, but we can address this in about 6 weeks when we are doing in-person visits. However, if you have any concerning issues, please call, and we will get you in sooner.   Avoid straining.  Benefiber 2 teaspoons twice daily: please see handout provided.   Limit toilet time to 2-3 minutes  We will see you in 6 weeks!  It was a pleasure to see you today. I strive to create trusting relationships with patients to provide genuine, compassionate, and quality care. I value your feedback. If you receive a survey regarding your visit,  I greatly appreciate you taking time to fill this out.   Annitta Needs, PhD, ANP-BC [facility]

## 2018-09-07 NOTE — Assessment & Plan Note (Addendum)
Last colonoscopy by Dr. Ladona Horns 18 months to 2 years ago. Patient is an excellent historian regarding health history. No concerning lower GI signs such as rectal bleeding, change in bowel habits. Discussed postponing banding until we can do subsequent sessions in office; however, he is desirous of banding today knowing that further banding procedures would be on hold until he can return for face to face visits unless further issues. He reports concern for waiting for this visit for a few months, so I have elected to band while he is already here in person. He does have a prominent left lateral internal column and anoscopy was unrevealing for mass or other abnormalities. As he has recent colonoscopy completed (which we are requesting), I banded the left lateral column. He will return in 6 weeks for subsequent banding.   Addendum: Colonoscopy report received dated May 2017 by Dr. Ladona Horns. Findings of three semi-pedunculated polyps ranging between 5-9 mm in size 110 cm from point of entry, hemorrhoids. All tubular adenomas. Recommend surveillance 3-5 years (2020 to 2023). Now recently diagnosed with esophageal cancer. No concerning lower GI signs/symptoms. Will complete banding electively in the future in 6 weeks.

## 2018-09-07 NOTE — Assessment & Plan Note (Signed)
68 year old male with chronic GERD, dysphagia for several months now worsening with daily episodes. Solid food and liquid dysphagia reported. Recently started on PPI several months ago with mild improvement but persistent symptoms. No prior EGD. Likely ring, web, stricture, doubt malignancy. Due to persistent and worsening symptoms, needs EGD in near future.   Proceed with upper endoscopy in the near future with Dr. Gala Romney. The risks, benefits, and alternatives have been discussed in detail with patient. They have stated understanding and desire to proceed.  Continue Protonix daily Pepcid prn Return in 6 weeks  .

## 2018-09-07 NOTE — Progress Notes (Signed)
Primary Care Physician:  Rory Percy, MD Primary Gastroenterologist:  Dr. Gala Romney   Chief Complaint  Patient presents with  . Dysphagia    ?gerd or acid reflux,hemorrhoids    HPI:   Adrian Gibson is a 68 y.o. male presenting today at the request of Dr. Nadara Mustard due to dysphagia. He is also concerned about hemorrhoids. Last colonoscopy about 2 years ago by Dr. Ladona Horns No prior EGD.   Symptom onset about 4-5 months ago would feel a "catch" in his esophagus. Now happening every day. Sometimes water will feel like it "catches". Tries to chew well, eat smaller bites. Prescribed Protonix by Dr. Nadara Mustard recently. Had not been taking before. Dysphagia slightly improved but still occurs. Had been taking Pepcid routinely before this. Was having epigastric pain chronically, somewhat improved. Chronic history of GERD. No N/V. No weight loss or lack of appetite. Takes Aleve a fair amount due to plantar fasciitis but hasn't had in about a month.   Has bouts of constipation but not currently. Has had issues with impactions in the past. Most recently no issues. Has to wipe a lot to get clean. Occasionally itching. No rectal bleeding. Once in awhile hemorrhoid will prolapse. Can manually reduce.  Feels like something is there right now.   Past Medical History:  Diagnosis Date  . CAD (coronary artery disease)    multivessel/ left main CAD.Marland Kitchen Four-vessel CABG 8/11... Normal left ventricular funciont  . Carpal tunnel syndrome   . Dyslipidemia   . Fusion of spine    Cervical vertebra fusion C5 C6 C7  . GERD (gastroesophageal reflux disease)   . Nasal polyps 2000   with rhinoplasty    Past Surgical History:  Procedure Laterality Date  . CARPAL TUNNEL RELEASE     left  . CATARACT EXTRACTION     Bilateral  . CERVICAL LAMINECTOMY     and diskectomy  . CORONARY ARTERY BYPASS GRAFT     X 4  . RHINOPLASTY     Nasal  . Ulnar Nerve Release from Left      Current Outpatient Medications  Medication  Sig Dispense Refill  . allopurinol (ZYLOPRIM) 100 MG tablet Take 1 tablet by mouth as needed.     Marland Kitchen aspirin EC 81 MG tablet Take 1 tablet (81 mg total) by mouth daily.    . Colchicine 0.6 MG CAPS Take 1 capsule by mouth 2 (two) times daily as needed.    . famotidine (PEPCID) 40 MG tablet Take 1 tablet by mouth every evening.    . fluticasone (FLONASE) 50 MCG/ACT nasal spray Place 2 sprays into the nose as needed.     . Multiple Vitamin (MULTIVITAMIN) tablet Take 1 tablet by mouth daily.      . nitroGLYCERIN (NITROSTAT) 0.4 MG SL tablet TAKE 1 TABLET BY MOUTH UNDER TONGUE EVERY 5 MINUTES AS NEEDED 25 tablet 3  . pantoprazole (PROTONIX) 40 MG tablet Take 40 mg by mouth daily.    . rosuvastatin (CRESTOR) 20 MG tablet Take 20-40 mg by mouth daily.     No current facility-administered medications for this visit.     Allergies as of 09/07/2018 - Review Complete 09/07/2018  Allergen Reaction Noted  . Atorvastatin Other (See Comments) 04/26/2013    Family History  Problem Relation Age of Onset  . Heart attack Father 38  . Heart attack Brother 71       Heavy smoker  . Colon cancer Neg Hx   . Colon polyps Neg  Hx     Social History   Socioeconomic History  . Marital status: Married    Spouse name: Not on file  . Number of children: Not on file  . Years of education: Not on file  . Highest education level: Not on file  Occupational History  . Occupation: Retired Physiological scientist    Comment: [employer] [phone]  Social Needs  . Financial resource strain: Not on file  . Food insecurity:    Worry: Not on file    Inability: Not on file  . Transportation needs:    Medical: Not on file    Non-medical: Not on file  Tobacco Use  . Smoking status: Former Smoker    Packs/day: 0.25    Years: 15.00    Pack years: 3.75    Types: Cigars    Start date: 07/10/1971    Last attempt to quit: 06/07/1985    Years since quitting: 33.2  . Smokeless tobacco: Former Systems developer    Types:  Chew  . Tobacco comment: He has a remote history of smoking cigars, but he quit doing this several years ago.  Substance and Sexual Activity  . Alcohol use: Yes    Alcohol/week: 0.0 standard drinks    Comment: Occasional beer or mixed drink  . Drug use: Never  . Sexual activity: Not on file  Lifestyle  . Physical activity:    Days per week: Not on file    Minutes per session: Not on file  . Stress: Not on file  Relationships  . Social connections:    Talks on phone: Not on file    Gets together: Not on file    Attends religious service: Not on file    Active member of club or organization: Not on file    Attends meetings of clubs or organizations: Not on file    Relationship status: Not on file  . Intimate partner violence:    Fear of current or ex partner: Not on file    Emotionally abused: Not on file    Physically abused: Not on file    Forced sexual activity: Not on file  Other Topics Concern  . Not on file  Social History Narrative   The patient lives in [place] with his wife.   Enjoys woodworking and fishing.   Has not been limited physically.    Review of Systems: Gen: Denies any fever, chills, fatigue, weight loss, lack of appetite.  CV: Denies chest pain, heart palpitations, peripheral edema, syncope.  Resp: +DOE GI: see HPI  GU : Denies urinary burning, urinary frequency, urinary hesitancy MS: Denies joint pain, muscle weakness, cramps, or limitation of movement.  Derm: Denies rash, itching, dry skin Psych: Denies depression, anxiety, memory loss, and confusion Heme: Denies bruising, bleeding, and enlarged lymph nodes.  Physical Exam: BP 128/87   Pulse 75   Temp (!) 97 F (36.1 C)   Ht 5' 9.5" (1.765 m)   Wt 222 lb 6.4 oz (100.9 kg)   BMI 32.37 kg/m  General:   Alert and oriented. Pleasant and cooperative. Well-nourished and well-developed.  Head:  Normocephalic and atraumatic. Eyes:  Without icterus, sclera clear and conjunctiva pink.  Ears:  Normal  auditory acuity. Nose:  No deformity, discharge,  or lesions. Mouth:  No deformity or lesions, oral mucosa pink.  Lungs:  Clear to auscultation bilaterally. No wheezes, rales, or rhonchi. No distress.  Heart:  S1, S2 present without murmurs appreciated.  Abdomen:  +BS,  soft, non-tender and non-distended. No HSM noted. No guarding or rebound. No masses appreciated.  Rectal:  Hemorrhoid tags externally, no thrombosis. Anoscopy with prominent internal hemorrhoids, left lateral column most prominent. No mass. No fissure. See procedure note.  Msk:  Symmetrical without gross deformities. Normal posture. Pulses:  Normal pulses noted. Extremities:  Without  edema. Neurologic:  Alert and  oriented x4 Psych:  Alert and cooperative. Normal mood and affect.  [facility] Banding Note: The patient presents with symptomatic grade 3 hemorrhoids, unresponsive to maximal medical therapy, requesting rubber band ligation of his  hemorrhoidal disease. All risks, benefits, and alternative forms of therapy were described and informed consent was obtained.  In the left lateral decubitus position anoscopic examination revealed grade 3 hemorrhoids most prominently in the left lateral position. Right anterior and posterior less prominent but still present.   The decision was made to band the left lateral internal hemorrhoid, and the [place] was used to perform band ligation without complication. Digital anorectal examination was then performed to assure proper positioning of the band, and to adjust the banded tissue as required. The patient was discharged home without pain or other issues. Dietary and behavioral recommendations were given.   No complications were encountered and the patient tolerated the procedure well

## 2018-09-07 NOTE — H&P (View-Only) (Signed)
Primary Care Physician:  Rory Percy, MD Primary Gastroenterologist:  Dr. Gala Romney   Chief Complaint  Patient presents with  . Dysphagia    ?gerd or acid reflux,hemorrhoids    HPI:   Adrian Gibson is a 68 y.o. male presenting today at the request of Dr. Nadara Mustard due to dysphagia. He is also concerned about hemorrhoids. Last colonoscopy about 2 years ago by Dr. Ladona Horns No prior EGD.   Symptom onset about 4-5 months ago would feel a "catch" in his esophagus. Now happening every day. Sometimes water will feel like it "catches". Tries to chew well, eat smaller bites. Prescribed Protonix by Dr. Nadara Mustard recently. Had not been taking before. Dysphagia slightly improved but still occurs. Had been taking Pepcid routinely before this. Was having epigastric pain chronically, somewhat improved. Chronic history of GERD. No N/V. No weight loss or lack of appetite. Takes Aleve a fair amount due to plantar fasciitis but hasn't had in about a month.   Has bouts of constipation but not currently. Has had issues with impactions in the past. Most recently no issues. Has to wipe a lot to get clean. Occasionally itching. No rectal bleeding. Once in awhile hemorrhoid will prolapse. Can manually reduce.  Feels like something is there right now.   Past Medical History:  Diagnosis Date  . CAD (coronary artery disease)    multivessel/ left main CAD.Marland Kitchen Four-vessel CABG 8/11... Normal left ventricular funciont  . Carpal tunnel syndrome   . Dyslipidemia   . Fusion of spine    Cervical vertebra fusion C5 C6 C7  . GERD (gastroesophageal reflux disease)   . Nasal polyps 2000   with rhinoplasty    Past Surgical History:  Procedure Laterality Date  . CARPAL TUNNEL RELEASE     left  . CATARACT EXTRACTION     Bilateral  . CERVICAL LAMINECTOMY     and diskectomy  . CORONARY ARTERY BYPASS GRAFT     X 4  . RHINOPLASTY     Nasal  . Ulnar Nerve Release from Left      Current Outpatient Medications  Medication  Sig Dispense Refill  . allopurinol (ZYLOPRIM) 100 MG tablet Take 1 tablet by mouth as needed.     Marland Kitchen aspirin EC 81 MG tablet Take 1 tablet (81 mg total) by mouth daily.    . Colchicine 0.6 MG CAPS Take 1 capsule by mouth 2 (two) times daily as needed.    . famotidine (PEPCID) 40 MG tablet Take 1 tablet by mouth every evening.    . fluticasone (FLONASE) 50 MCG/ACT nasal spray Place 2 sprays into the nose as needed.     . Multiple Vitamin (MULTIVITAMIN) tablet Take 1 tablet by mouth daily.      . nitroGLYCERIN (NITROSTAT) 0.4 MG SL tablet TAKE 1 TABLET BY MOUTH UNDER TONGUE EVERY 5 MINUTES AS NEEDED 25 tablet 3  . pantoprazole (PROTONIX) 40 MG tablet Take 40 mg by mouth daily.    . rosuvastatin (CRESTOR) 20 MG tablet Take 20-40 mg by mouth daily.     No current facility-administered medications for this visit.     Allergies as of 09/07/2018 - Review Complete 09/07/2018  Allergen Reaction Noted  . Atorvastatin Other (See Comments) 04/26/2013    Family History  Problem Relation Age of Onset  . Heart attack Father 33  . Heart attack Brother 58       Heavy smoker  . Colon cancer Neg Hx   . Colon polyps Neg  Hx     Social History   Socioeconomic History  . Marital status: Married    Spouse name: Not on file  . Number of children: Not on file  . Years of education: Not on file  . Highest education level: Not on file  Occupational History  . Occupation: Retired Physiological scientist    Comment: [employer] [phone]  Social Needs  . Financial resource strain: Not on file  . Food insecurity:    Worry: Not on file    Inability: Not on file  . Transportation needs:    Medical: Not on file    Non-medical: Not on file  Tobacco Use  . Smoking status: Former Smoker    Packs/day: 0.25    Years: 15.00    Pack years: 3.75    Types: Cigars    Start date: 07/10/1971    Last attempt to quit: 06/07/1985    Years since quitting: 33.2  . Smokeless tobacco: Former Systems developer    Types:  Chew  . Tobacco comment: He has a remote history of smoking cigars, but he quit doing this several years ago.  Substance and Sexual Activity  . Alcohol use: Yes    Alcohol/week: 0.0 standard drinks    Comment: Occasional beer or mixed drink  . Drug use: Never  . Sexual activity: Not on file  Lifestyle  . Physical activity:    Days per week: Not on file    Minutes per session: Not on file  . Stress: Not on file  Relationships  . Social connections:    Talks on phone: Not on file    Gets together: Not on file    Attends religious service: Not on file    Active member of club or organization: Not on file    Attends meetings of clubs or organizations: Not on file    Relationship status: Not on file  . Intimate partner violence:    Fear of current or ex partner: Not on file    Emotionally abused: Not on file    Physically abused: Not on file    Forced sexual activity: Not on file  Other Topics Concern  . Not on file  Social History Narrative   The patient lives in [place] with his wife.   Enjoys woodworking and fishing.   Has not been limited physically.    Review of Systems: Gen: Denies any fever, chills, fatigue, weight loss, lack of appetite.  CV: Denies chest pain, heart palpitations, peripheral edema, syncope.  Resp: +DOE GI: see HPI  GU : Denies urinary burning, urinary frequency, urinary hesitancy MS: Denies joint pain, muscle weakness, cramps, or limitation of movement.  Derm: Denies rash, itching, dry skin Psych: Denies depression, anxiety, memory loss, and confusion Heme: Denies bruising, bleeding, and enlarged lymph nodes.  Physical Exam: BP 128/87   Pulse 75   Temp (!) 97 F (36.1 C)   Ht 5' 9.5" (1.765 m)   Wt 222 lb 6.4 oz (100.9 kg)   BMI 32.37 kg/m  General:   Alert and oriented. Pleasant and cooperative. Well-nourished and well-developed.  Head:  Normocephalic and atraumatic. Eyes:  Without icterus, sclera clear and conjunctiva pink.  Ears:  Normal  auditory acuity. Nose:  No deformity, discharge,  or lesions. Mouth:  No deformity or lesions, oral mucosa pink.  Lungs:  Clear to auscultation bilaterally. No wheezes, rales, or rhonchi. No distress.  Heart:  S1, S2 present without murmurs appreciated.  Abdomen:  +BS,  soft, non-tender and non-distended. No HSM noted. No guarding or rebound. No masses appreciated.  Rectal:  Hemorrhoid tags externally, no thrombosis. Anoscopy with prominent internal hemorrhoids, left lateral column most prominent. No mass. No fissure. See procedure note.  Msk:  Symmetrical without gross deformities. Normal posture. Pulses:  Normal pulses noted. Extremities:  Without  edema. Neurologic:  Alert and  oriented x4 Psych:  Alert and cooperative. Normal mood and affect.  [facility] Banding Note: The patient presents with symptomatic grade 3 hemorrhoids, unresponsive to maximal medical therapy, requesting rubber band ligation of his  hemorrhoidal disease. All risks, benefits, and alternative forms of therapy were described and informed consent was obtained.  In the left lateral decubitus position anoscopic examination revealed grade 3 hemorrhoids most prominently in the left lateral position. Right anterior and posterior less prominent but still present.   The decision was made to band the left lateral internal hemorrhoid, and the [place] was used to perform band ligation without complication. Digital anorectal examination was then performed to assure proper positioning of the band, and to adjust the banded tissue as required. The patient was discharged home without pain or other issues. Dietary and behavioral recommendations were given.   No complications were encountered and the patient tolerated the procedure well

## 2018-09-11 NOTE — Progress Notes (Signed)
cc'ed to pcp °

## 2018-09-18 ENCOUNTER — Telehealth: Payer: Self-pay | Admitting: *Deleted

## 2018-09-18 NOTE — Telephone Encounter (Signed)
Called patient and he is aware to arrive at 6:15am on 09/19/2018 for procedure. He voiced understanding.

## 2018-09-19 ENCOUNTER — Encounter (HOSPITAL_COMMUNITY)
Admission: RE | Disposition: A | Discharge status: Home / Self Care | Payer: Self-pay | Source: Home / Self Care | Attending: Internal Medicine

## 2018-09-19 ENCOUNTER — Ambulatory Visit (HOSPITAL_COMMUNITY)
Admission: RE | Admit: 2018-09-19 | Discharge: 2018-09-19 | Disposition: A | Discharge status: Home / Self Care | Payer: Medicare Other | Attending: Internal Medicine | Admitting: Internal Medicine

## 2018-09-19 ENCOUNTER — Other Ambulatory Visit: Payer: Self-pay

## 2018-09-19 ENCOUNTER — Encounter (HOSPITAL_COMMUNITY): Payer: Self-pay | Admitting: *Deleted

## 2018-09-19 ENCOUNTER — Telehealth: Payer: Self-pay

## 2018-09-19 DIAGNOSIS — Z87891 Personal history of nicotine dependence: Secondary | ICD-10-CM | POA: Insufficient documentation

## 2018-09-19 DIAGNOSIS — C159 Malignant neoplasm of esophagus, unspecified: Secondary | ICD-10-CM | POA: Diagnosis not present

## 2018-09-19 DIAGNOSIS — R1310 Dysphagia, unspecified: Secondary | ICD-10-CM | POA: Diagnosis not present

## 2018-09-19 DIAGNOSIS — K219 Gastro-esophageal reflux disease without esophagitis: Secondary | ICD-10-CM | POA: Diagnosis not present

## 2018-09-19 DIAGNOSIS — Z79899 Other long term (current) drug therapy: Secondary | ICD-10-CM | POA: Insufficient documentation

## 2018-09-19 DIAGNOSIS — E785 Hyperlipidemia, unspecified: Secondary | ICD-10-CM | POA: Insufficient documentation

## 2018-09-19 DIAGNOSIS — K449 Diaphragmatic hernia without obstruction or gangrene: Secondary | ICD-10-CM | POA: Diagnosis not present

## 2018-09-19 DIAGNOSIS — Z7951 Long term (current) use of inhaled steroids: Secondary | ICD-10-CM | POA: Diagnosis not present

## 2018-09-19 DIAGNOSIS — C160 Malignant neoplasm of cardia: Secondary | ICD-10-CM | POA: Insufficient documentation

## 2018-09-19 DIAGNOSIS — Z7982 Long term (current) use of aspirin: Secondary | ICD-10-CM | POA: Insufficient documentation

## 2018-09-19 DIAGNOSIS — I2510 Atherosclerotic heart disease of native coronary artery without angina pectoris: Secondary | ICD-10-CM | POA: Insufficient documentation

## 2018-09-19 DIAGNOSIS — Z951 Presence of aortocoronary bypass graft: Secondary | ICD-10-CM | POA: Insufficient documentation

## 2018-09-19 DIAGNOSIS — D490 Neoplasm of unspecified behavior of digestive system: Secondary | ICD-10-CM

## 2018-09-19 HISTORY — PX: ESOPHAGOGASTRODUODENOSCOPY: SHX5428

## 2018-09-19 HISTORY — DX: Sleep apnea, unspecified: G47.30

## 2018-09-19 HISTORY — PX: BIOPSY: SHX5522

## 2018-09-19 LAB — CREATININE, SERUM
Creatinine, Ser: 0.92 mg/dL (ref 0.61–1.24)
GFR calc Af Amer: >60 mL/min (ref >60)
GFR calc non Af Amer: >60 mL/min (ref >60)

## 2018-09-19 SURGERY — EGD (ESOPHAGOGASTRODUODENOSCOPY)
Anesthesia: Moderate Sedation

## 2018-09-19 MED ORDER — MEPERIDINE HCL 50 MG/ML IJ SOLN
INTRAMUSCULAR | Status: AC
  Filled 2018-09-19: qty 1

## 2018-09-19 MED ORDER — LIDOCAINE VISCOUS HCL 2 % MT SOLN
OROMUCOSAL | Status: AC
  Filled 2018-09-19: qty 15

## 2018-09-19 MED ORDER — ONDANSETRON HCL 4 MG/2ML IJ SOLN
INTRAMUSCULAR | Status: AC
  Filled 2018-09-19: qty 2

## 2018-09-19 MED ORDER — MIDAZOLAM HCL 5 MG/5ML IJ SOLN
INTRAMUSCULAR | Status: DC | PRN
  Administered 2018-09-19 (×2): 1 mg via INTRAVENOUS
  Administered 2018-09-19: 2 mg via INTRAVENOUS

## 2018-09-19 MED ORDER — MEPERIDINE HCL 100 MG/ML IJ SOLN
INTRAMUSCULAR | Status: DC | PRN
  Administered 2018-09-19: 25 mg via INTRAVENOUS
  Administered 2018-09-19: 10 mg via INTRAVENOUS

## 2018-09-19 MED ORDER — SODIUM CHLORIDE 0.9 % IV SOLN
INTRAVENOUS | Status: DC
  Administered 2018-09-19: 1000 mL via INTRAVENOUS

## 2018-09-19 MED ORDER — LIDOCAINE VISCOUS HCL 2 % MT SOLN
OROMUCOSAL | Status: DC | PRN
  Administered 2018-09-19: 4 mL via OROMUCOSAL

## 2018-09-19 MED ORDER — ONDANSETRON HCL 4 MG/2ML IJ SOLN
INTRAMUSCULAR | Status: DC | PRN
  Administered 2018-09-19: 4 mg via INTRAVENOUS

## 2018-09-19 MED ORDER — STERILE WATER FOR IRRIGATION IR SOLN
Status: DC | PRN
  Administered 2018-09-19: 08:00:00

## 2018-09-19 MED ORDER — MIDAZOLAM HCL 5 MG/5ML IJ SOLN
INTRAMUSCULAR | Status: AC
  Filled 2018-09-19: qty 10

## 2018-09-19 NOTE — Telephone Encounter (Signed)
Adrian Gibson at [facility] called office, Dr. Gala Romney done EGD this am and ordered CT chest, abd/pelvis to be done tomorrow d/t esophageal tumor. Spoke to [place] at [facility] CT, she advised to order CT chest, abd/pelvis with IV and oral contrast. CT scans scheduled for 09/20/18 at 11:00am, arrive at 10:45am. Pick up contrast. NPO 4 hours prior to procedure. Needs creatinine prior to CT.  Called Adrian Gibson at endo. Appt details given and she will pick up contrast for pt and give appt details. Endo will also draw creatinine.  No PA needed for CT chest w/contrast and CT abd/pelvis w/contrast per [facility] website.

## 2018-09-19 NOTE — Interval H&P Note (Signed)
History and Physical Interval Note:  09/19/2018 7:35 AM  Adrian Gibson  has presented today for surgery, with the diagnosis of dysphagia.  The various methods of treatment have been discussed with the patient and family. After consideration of risks, benefits and other options for treatment, the patient has consented to  Procedure(s) with comments: ESOPHAGOGASTRODUODENOSCOPY (EGD) (N/A) - 8:45am - office spoke with patient MALONEY DILATION (N/A) as a surgical intervention.  The patient's history has been reviewed, patient examined, no change in status, stable for surgery.  I have reviewed the patient's chart and labs.  Questions were answered to the patient's satisfaction.     See [place]  No change.  EGD with ED per plan as feasible/appropriate today.  Protonix 40 mg daily controls reflux very well.  The risks, benefits, limitations, alternatives and imponderables have been reviewed with the patient. Potential for esophageal dilation, biopsy, etc. have also been reviewed.  Questions have been answered. All parties agreeable.  Addendum: Patient reports  hemorrhoid symptoms much better after recent banding session.

## 2018-09-19 NOTE — Op Note (Signed)
[facility] Patient Name: Adrian Gibson Procedure Date: 09/19/2018 7:14 AM MRN: [id number] Date of Birth: Oct 29, 1950 Attending MD: Norvel Richards , MD CSN: [id number] Age: 68 Admit Type: Outpatient Procedure:                Upper GI endoscopy Indications:              Dysphagia Providers:                Norvel Richards, MD, Jeanann Lewandowsky. Gwenlyn Perking RN, RN,                            Aram Candela Referring MD:              Medicines:                Midazolam 4 mg IV, Meperidine 35 mg IV, Ondansetron                            4 mg IV Complications:            No immediate complications. Estimated Blood Loss:     Estimated blood loss was minimal. Procedure:                Pre-Anesthesia Assessment:                           - Prior to the procedure, a History and Physical                            was performed, and patient medications and                            allergies were reviewed. The patient's tolerance of                            previous anesthesia was also reviewed. The risks                            and benefits of the procedure and the sedation                            options and risks were discussed with the patient.                            All questions were answered, and informed consent                            was obtained. Prior Anticoagulants: The patient has                            taken no previous anticoagulant or antiplatelet                            agents. ASA Grade Assessment: II - A patient with  mild systemic disease. After reviewing the risks                            and benefits, the patient was deemed in                            satisfactory condition to undergo the procedure.                           After obtaining informed consent, the endoscope was                            passed under direct vision. Throughout the                            procedure, the patient's blood pressure, pulse,  and                            oxygen saturations were monitored continuously. The                            GIF-H190 ([id number]) scope was introduced through the                            mouth, and advanced to the second part of duodenum.                            The upper GI endoscopy was accomplished without                            difficulty. The patient tolerated the procedure                            well. Scope In: 7:51:49 AM Scope Out: 8:00:29 AM Total Procedure Duration: 0 hours 8 minutes 40 seconds  Findings:      A large, fungating mass was found at the gastroesophageal junction. The       mass was partially obstructing. Exophytic, circumferential. Mass located       from GE junction (38 centimeters from incisors) to 34 cm from incisors.       Approximately 4 cm in length. Lumen compromised; However, I was easily       able to advance the adult scope through the lumen at this level.       Diaphragmatic hiatus 42 cm from incisors.      A 4 cm hiatal hernia was present.      Aside from hiatal hernia, stomach appeared normal.      The duodenal bulb and second portion of the duodenum were normal.       Multiple biopsies of the esophageal mass taken for histologic study       utilizing jumbo biopsy forceps. 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 Status post                            biopsy.                           - 4 cm hiatal hernia.                           - The examination was otherwise normal.                           - Normal duodenal bulb and second portion of the                            duodenum. No dilation performed.                           - Moderate Sedation:      Moderate (conscious) sedation was administered by the endoscopy nurse       and supervised by the endoscopist. The following parameters were       monitored: oxygen saturation, heart rate,  blood pressure, respiratory       rate, EKG, adequacy of pulmonary ventilation, and response to care.       Total physician intraservice time was 18 minutes. Recommendation:           - Patient has a contact number available for                            emergencies. The signs and symptoms of potential                            delayed complications were discussed with the                            patient. Return to normal activities tomorrow.                            Written discharge instructions were provided to the                            patient. Dysphagia 2 diet. Follow-up on pathology.                            Proceed with a chest, abdominal and pelvic CT. At                            patient's request, I called his wife, Rafiq Bucklin, at [phone]?"[phone]. I told both patient and                            spouse we are likely dealing with esophageal cancer. Procedure Code(s):        ---  Professional ---                           [phone], Esophagogastroduodenoscopy, flexible,                            transoral; diagnostic, including collection of                            specimen(s) by brushing or washing, when performed                            (separate procedure)                           G0500, Moderate sedation services provided by the                            same physician or other qualified health care                            professional performing a gastrointestinal                            endoscopic service that sedation supports,                            requiring the presence of an independent trained                            observer to assist in the monitoring of the                            patient's level of consciousness and physiological                            status; initial 15 minutes of intra-service time;                            patient age 44 years or older (additional time may                             be reported with [phone], as appropriate) Diagnosis Code(s):        --- Professional ---                           D49.0, Neoplasm of unspecified behavior of                            digestive system                           K44.9, Diaphragmatic hernia without obstruction or                            gangrene  R13.10, Dysphagia, unspecified CPT copyright 2019 American Medical Association. All rights reserved. The codes documented in this report are preliminary and upon coder review may  be revised to meet current compliance requirements. Cristopher Estimable. Donaciano Range, MD Norvel Richards, MD 09/19/2018 8:34:24 AM This report has been signed electronically. Number of Addenda: 0

## 2018-09-19 NOTE — Discharge Instructions (Signed)
EGD Discharge instructions Please read the instructions outlined below and refer to this sheet in the next few weeks. These discharge instructions provide you with general information on caring for yourself after you leave the hospital. Your doctor may also give you specific instructions. While your treatment has been planned according to the most current medical practices available, unavoidable complications occasionally occur. If you have any problems or questions after discharge, please call your doctor. ACTIVITY  You may resume your regular activity but move at a slower pace for the next 24 hours.   Take frequent rest periods for the next 24 hours.   Walking will help expel (get rid of) the air and reduce the bloated feeling in your abdomen.   No driving for 24 hours (because of the anesthesia (medicine) used during the test).   You may shower.   Do not sign any important legal documents or operate any machinery for 24 hours (because of the anesthesia used during the test).  NUTRITION  Drink plenty of fluids.   You may resume your normal diet.   Begin with a light meal and progress to your normal diet.   Avoid alcoholic beverages for 24 hours or as instructed by your caregiver.  MEDICATIONS  You may resume your normal medications unless your caregiver tells you otherwise.  WHAT YOU CAN EXPECT TODAY  You may experience abdominal discomfort such as a feeling of fullness or gas pains.  FOLLOW-UP  Your doctor will discuss the results of your test with you.   SEEK IMMEDIATE MEDICAL ATTENTION IF ANY OF THE FOLLOWING OCCUR:  Excessive nausea (feeling sick to your stomach) and/or vomiting.   Severe abdominal pain and distention (swelling).   Trouble swallowing.   Temperature over 101 F (37.8 C).   Rectal bleeding or vomiting of blood.    There is a mass in your esophagus causing your swallowing difficulties.  It was biopsied.  Could not be dilated.  Dysphagia 2  diet  contrast CT of the chest, abdomen and pelvis to further evaluate  Further recommendations to follow pending review of the CT and biopsy report.   Dysphagia Eating Plan, Minced and Moist Foods This eating plan is for people with moderate swallowing problems who are transitioning from pureed to solid foods. Moist and minced foods are soft and cut into very small chunks so that they can be swallowed safely. On this eating plan, you may be instructed to drink liquids that are thickened. Work with your health care provider and your diet and nutrition specialist (dietitian) to make sure that you are following the diet safely and getting all the nutrients you need. What are tips for following this plan? General guidelines for foods   You may eat foods that are soft and moist.  Always test food texture before taking a bite. Poke food with a fork or spoon to make sure it is tender.  Take small bites. Each bite should be smaller than your little finger nail (about 4 mm by 4 mm).  If you were on a pureed food eating plan, you may still eat any of the foods included in that diet.  Avoid foods that are dry, hard, sticky, chewy, coarse, or crunchy.  Avoid foods that separate into thin liquids and solids, such as cereal with milk or chunky soups.  Avoid liquids that have seeds or chunks.  If instructed by your health care provider, thicken liquids. Follow your health care provider's instructions about what products to use, how  to do this, and to what thickness. ? You may use a commercial thickener, rice cereal, or potato flakes. ? Thickened liquids are usually a pudding-like consistency, or they may be as thick as honey or thick enough to eat with a spoon. Cooking  You may need to use a blender, whisk, or masher to soften some of your foods.  To moisten foods, you may add liquids while you are blending, mashing, or grinding your foods to the right consistency. These liquids include  gravies, sauces, vegetable or fruit juice, milk, half and half, or water.  Reheat foods slowly to prevent a tough crust from forming. Meal planning  Eat a variety of foods in order to get all the nutrients you need.  Follow your meal plan as told by your health care provider or dietitian. What foods are allowed? Grains Soaked soft breads without nuts or seeds. Pancakes, sweet rolls, pastries, and [place] toast that have been moistened with syrup or sauce. Well-cooked pasta, noodles, rice, and bread dressing in very small pieces and thick sauce. Soft dumplings or spaetzle in very small pieces and butter or gravy. Soft-cooked cereals. Vegetables Very soft, well-cooked vegetables in very small pieces. Soft-cooked, mashed potatoes. Thickened vegetable juice. Fruits Canned or cooked fruits that are soft or moist and do not have skin or seeds. Fresh, soft bananas. Thickened fruit juices. Meat and other protein foods Tender, moist, and finely minced or ground meats or poultry. Moist meatballs or meatloaf. Fish without bones. Scrambled, poached, or soft-cooked eggs. Tofu. Tempeh and meat alternatives in very small pieces. Well-cooked, moistened and mashed beans, baked beans, peas, and other legumes. Dairy Thickened milk. Cream cheese. Yogurt. Cottage cheese. Sour cream. Fats and oils Butter. Margarine. Cream for cereal, depending on liquid consistency allowed. Gravy. Cream sauces. Mayonnaise. Sweets and desserts Pudding. Custard. Ice cream and sherbet. Whipped toppings. Soft, moist cakes. Icing. Jelly. Jams and preserves without seeds. Seasoning and other foods Sauces and salsas that have soft chunks that are smaller than 44mm. Salad dressings. Casseroles with small pieces of tender meat. All seasonings and sweeteners. Beverages Anything prepared at the thickness recommended by your dietitian. What foods are not allowed? Grains Breads that are hard or have nuts or seeds. Dry biscuits, pancakes,  waffles, and bread dressing. Coarse cereals. Cereals that have nuts, seeds, dried fruits, or coconut. Sticky rice. Large pieces of pasta. Vegetables All raw vegetables. Tough, fibrous, chewy, or stringy cooked vegetables, such as celery, peas, broccoli, cabbage, Brussels sprouts, and asparagus. Potato skins. Potato and other vegetable chips. Fried or French-fried potatoes. Cooked corn and peas. Fruits Hard, crunchy, stringy, high-pulp, and juicy raw fruits such as apples, pineapple, papaya, and watermelon. Fruits with skins and seeds, such as grapes. Dried fruit and fruit leather. Meats and other protein foods Large pieces of meat. Dry, tough meats, such as bacon, sausage, and hot dogs. Chicken, [place], or fish with skin and bones. Crunchy peanut butter. Nuts. Seeds. Dairy Yogurt with nuts, seeds, or large chunks. Large chunks of cheese. Frozen desserts and milk consistency not allowed by your dietitian. Sweets and desserts Coarse, hard, chewy, or sticky desserts. Any dessert with nuts, seeds, coconut, pineapple, or dried fruit. Bread pudding. Seasoning and other foods Soups and casseroles with large chunks. Sandwiches. Pizza. Summary  Moist and minced foods can be helpful for people with moderate swallowing problems.  On the dysphagia eating plan, you may eat foods that are soft, moist, and cut into pieces smaller than 54mm by 24mm.  You may  be instructed to thicken liquids. Follow your health care provider's instructions about how to do this and to what consistency. This information is not intended to replace advice given to you by your health care provider. Make sure you discuss any questions you have with your health care provider. Document Released: 05/24/2005 Document Revised: 09/03/2016 Document Reviewed: 09/03/2016 Elsevier Interactive Patient Education  2019 [place] [facility], Adult, Care After This sheet gives you information about how to care for yourself after your  procedure. Your health care provider may also give you more specific instructions. If you have problems or questions, contact your health care provider. What can I expect after the procedure? After the procedure, it is common to have:  A sore throat.  Mild stomach pain or discomfort.  Bloating.  Nausea. Follow these instructions at home:   Follow instructions from your health care provider about what to eat or drink after your procedure.  Return to your normal activities as told by your health care provider. Ask your health care provider what activities are safe for you.  Take over-the-counter and prescription medicines only as told by your health care provider.  Do not drive for 24 hours if you were given a sedative during your procedure.  Keep all follow-up visits as told by your health care provider. This is important. Contact a health care provider if you have:  A sore throat that lasts longer than one day.  Trouble swallowing. Get help right away if:  You vomit blood or your vomit looks like coffee grounds.  You have: ? A fever. ? Bloody, black, or tarry stools. ? A severe sore throat or you cannot swallow. ? Difficulty breathing. ? Severe pain in your chest or abdomen. Summary  After the procedure, it is common to have a sore throat, mild stomach discomfort, bloating, and nausea.  Do not drive for 24 hours if you were given a sedative during the procedure.  Follow instructions from your health care provider about what to eat or drink after your procedure.  Return to your normal activities as told by your health care provider. This information is not intended to replace advice given to you by your health care provider. Make sure you discuss any questions you have with your health care provider. Document Released: 11/23/2011 Document Revised: 10/24/2017 Document Reviewed: 10/24/2017 Elsevier Interactive Patient Education  2019 [employer].

## 2018-09-20 ENCOUNTER — Ambulatory Visit (HOSPITAL_COMMUNITY)
Admission: RE | Admit: 2018-09-20 | Discharge: 2018-09-20 | Disposition: A | Discharge status: Home / Self Care | Payer: Medicare Other | Source: Ambulatory Visit | Attending: Internal Medicine | Admitting: Internal Medicine

## 2018-09-20 ENCOUNTER — Telehealth: Payer: Self-pay | Admitting: Internal Medicine

## 2018-09-20 DIAGNOSIS — D490 Neoplasm of unspecified behavior of digestive system: Secondary | ICD-10-CM | POA: Diagnosis not present

## 2018-09-20 MED ORDER — IOHEXOL 300 MG/ML  SOLN
100.0000 mL | Freq: Once | INTRAMUSCULAR | Status: AC | PRN
  Administered 2018-09-20: 11:00:00 100 mL via INTRAVENOUS

## 2018-09-20 NOTE — Telephone Encounter (Signed)
Dr. 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  I called patient and informed him of diagnosis.  He is getting his CT scans today.  I recommended oncology referral either to our center here in [place] or in [place].  I have called Dr. Rory Percy.  He is out today.  Reaching out to get his guidance as to which center he would prefer.  I will try and make contact with him tomorrow and then let the patient know of his recommendations.  Further recommendations to follow.

## 2018-09-20 NOTE — Telephone Encounter (Signed)
Noted, will await further for referral to oncology.

## 2018-09-20 NOTE — Telephone Encounter (Signed)
Noted  

## 2018-09-21 ENCOUNTER — Encounter (HOSPITAL_COMMUNITY): Payer: Self-pay | Admitting: Internal Medicine

## 2018-09-28 ENCOUNTER — Other Ambulatory Visit (HOSPITAL_COMMUNITY): Payer: Self-pay | Admitting: Oncology

## 2018-09-28 DIAGNOSIS — C155 Malignant neoplasm of lower third of esophagus: Secondary | ICD-10-CM

## 2018-09-28 NOTE — Telephone Encounter (Signed)
See CT result note (Dr. Nadara Mustard taking care of referral to [facility] oncology).

## 2018-10-02 ENCOUNTER — Other Ambulatory Visit: Payer: Self-pay

## 2018-10-02 ENCOUNTER — Encounter (HOSPITAL_COMMUNITY)
Admission: RE | Admit: 2018-10-02 | Discharge: 2018-10-02 | Disposition: A | Discharge status: Home / Self Care | Payer: Medicare Other | Source: Ambulatory Visit | Attending: Oncology | Admitting: Oncology

## 2018-10-02 DIAGNOSIS — C155 Malignant neoplasm of lower third of esophagus: Secondary | ICD-10-CM | POA: Diagnosis present

## 2018-10-02 MED ORDER — FLUDEOXYGLUCOSE F - 18 (FDG) INJECTION
13.3300 | Freq: Once | INTRAVENOUS | Status: AC | PRN
  Administered 2018-10-02: 19:00:00 13.33 via INTRAVENOUS

## 2018-10-09 ENCOUNTER — Other Ambulatory Visit: Payer: Self-pay

## 2018-10-09 ENCOUNTER — Ambulatory Visit: Payer: Medicare Other | Admitting: Gastroenterology

## 2018-10-09 ENCOUNTER — Encounter: Payer: Self-pay | Admitting: Gastroenterology

## 2018-10-09 VITALS — BP 123/83 | HR 66 | Temp 97.4°F | Ht 69.5 in | Wt 224.6 lb

## 2018-10-09 DIAGNOSIS — K642 Third degree hemorrhoids: Secondary | ICD-10-CM | POA: Diagnosis not present

## 2018-10-09 NOTE — Patient Instructions (Signed)
Avoid straining.  Continue Benefiber up to 3 teaspoons every day. Please call if persistent constipation and/or straining. I recommend increasing your water intake as well to help avoid constipation.   Limit toilet time to 2-3 minutes.  Please call with any interim problems, or you can send a mychart message.  We will see you in 6-8 months!  I will be thinking of you as your start the process for treatments. I look forward to a good report when I see you again!  I enjoyed seeing you again today! As you know, I value our relationship and want to provide genuine, compassionate, and quality care. I welcome your feedback. If you receive a survey regarding your visit,  I greatly appreciate you taking time to fill this out. See you next time!  Annitta Needs, PhD, ANP-BC [facility]

## 2018-10-09 NOTE — Progress Notes (Signed)
[facility] Banding Note:   Pleasant 68 year old male presenting today for consideration of additional banding. When last seen in April 2020 for consultation of dysphagia, we elected to band the  left lateral internal hemorrhoid. 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 He has established care with [facility] (Dr. Olen Pel) and will be undergoing likely chemo and radiation, with surgical resection to follow. Colonoscopy report received dated May 2017 by Dr. Ladona Horns. Findings of three semi-pedunculated polyps ranging between 5-9 mm in size 110 cm from point of entry, hemorrhoids. All tubular adenomas. Recommend surveillance 3-5 years (2020 to 2023). We will address this in the future as he is dealing with esophageal cancer treatments.   The patient presents with symptomatic grade 3 hemorrhoids, unresponsive to maximal medical therapy, requesting rubber band ligation of hishemorrhoidal disease. All risks, benefits, and alternative forms of therapy were described and informed consent was obtained. He did notice some improvement with prior banding.   The decision was made to band the right posterior internal hemorrhoid, and the [place] was used to perform band ligation without complication. Digital anorectal examination was then performed to assure proper positioning of the band, and no adjustment was required. As he did well, I then proceeded to band the right anterior internal hemorrhoid. DRE then performed with appropriate positioning. He tolerated this quite well. The patient was discharged home without pain or other issues. Dietary and behavioral recommendations were given, along with follow-up instructions. The patient will return in 6 months for routine office visit.    No complications were encountered and the patient tolerated the procedure well  Annitta Needs, PhD, [facility]

## 2018-11-06 ENCOUNTER — Encounter (HOSPITAL_COMMUNITY): Payer: Self-pay | Admitting: Oncology

## 2018-12-21 ENCOUNTER — Other Ambulatory Visit (HOSPITAL_COMMUNITY): Payer: Self-pay | Admitting: Oncology

## 2018-12-21 DIAGNOSIS — C155 Malignant neoplasm of lower third of esophagus: Secondary | ICD-10-CM

## 2018-12-25 ENCOUNTER — Other Ambulatory Visit: Payer: Self-pay

## 2018-12-25 ENCOUNTER — Ambulatory Visit (HOSPITAL_COMMUNITY)
Admission: RE | Admit: 2018-12-25 | Discharge: 2018-12-25 | Disposition: A | Discharge status: Home / Self Care | Payer: Medicare Other | Source: Ambulatory Visit | Attending: Oncology | Admitting: Oncology

## 2018-12-25 DIAGNOSIS — C155 Malignant neoplasm of lower third of esophagus: Secondary | ICD-10-CM | POA: Diagnosis present

## 2018-12-25 MED ORDER — FLUDEOXYGLUCOSE F - 18 (FDG) INJECTION
11.4700 | Freq: Once | INTRAVENOUS | Status: AC | PRN
  Administered 2018-12-25: 11.47 via INTRAVENOUS

## 2019-03-14 ENCOUNTER — Other Ambulatory Visit (HOSPITAL_COMMUNITY): Payer: Self-pay | Admitting: Oncology

## 2019-03-14 DIAGNOSIS — C155 Malignant neoplasm of lower third of esophagus: Secondary | ICD-10-CM

## 2019-03-19 ENCOUNTER — Other Ambulatory Visit: Payer: Self-pay

## 2019-03-19 ENCOUNTER — Ambulatory Visit (HOSPITAL_COMMUNITY)
Admission: RE | Admit: 2019-03-19 | Discharge: 2019-03-19 | Disposition: A | Discharge status: Home / Self Care | Payer: Medicare Other | Source: Ambulatory Visit | Attending: Oncology | Admitting: Oncology

## 2019-03-19 DIAGNOSIS — C155 Malignant neoplasm of lower third of esophagus: Secondary | ICD-10-CM | POA: Insufficient documentation

## 2019-03-19 MED ORDER — FLUDEOXYGLUCOSE F - 18 (FDG) INJECTION
12.1300 | Freq: Once | INTRAVENOUS | Status: AC | PRN
  Administered 2019-03-19: 21:00:00 12.13 via INTRAVENOUS

## 2019-03-22 ENCOUNTER — Telehealth: Payer: Self-pay | Admitting: Cardiology

## 2019-03-22 NOTE — Telephone Encounter (Signed)

## 2019-03-23 ENCOUNTER — Other Ambulatory Visit: Payer: Self-pay

## 2019-03-23 ENCOUNTER — Encounter: Payer: Self-pay | Admitting: Cardiology

## 2019-03-23 ENCOUNTER — Ambulatory Visit: Payer: Medicare Other | Admitting: Cardiology

## 2019-03-23 VITALS — BP 120/80 | HR 81 | Ht 70.0 in | Wt 221.0 lb

## 2019-03-23 DIAGNOSIS — E782 Mixed hyperlipidemia: Secondary | ICD-10-CM

## 2019-03-23 DIAGNOSIS — I2510 Atherosclerotic heart disease of native coronary artery without angina pectoris: Secondary | ICD-10-CM | POA: Diagnosis not present

## 2019-03-23 NOTE — Progress Notes (Signed)
Clinical Summary Mr. Bailin is a 68 y.o.male seen today for follow up of the following medical problems.   1. CAD  - prior CABG 4 vessel 01/2010 at [facility], normal LVEF at that time.  - exercise MPI 02/2012: 10 min, 13 METs, Duke Treadmill score 10 low risk, no perfusion defects.  - 04/2016 nuclear stress: no ischemia - chronic left sided MSK pain ever since CABG    - 01/2019 echo [facility] LVEF 55-60%, mild LVH, aortic sclerosis.  - no recent chest pain. Some SOB with exertion he attributes to deconditioning and chemo.    2. Hyperlipidemia  - has tried multiple different statins. Has worked with Dr Nadara Mustard to find a regimen.  - tolerating crestor 20mg  daily.  08/2016 TC 192 TG 132 HDL 42 LDL 124  - reports recent labs with pcp, compliant with statin.     3. 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 CAD (coronary artery disease)    multivessel/ left main CAD.Marland Kitchen Four-vessel CABG 8/11... Normal left ventricular funciont  . Carpal tunnel syndrome   . Dyslipidemia   . Fusion of spine    Cervical vertebra fusion C5 C6 C7  . GERD (gastroesophageal reflux disease)   . Nasal polyps 2000   with rhinoplasty  . Sleep apnea      Allergies  Allergen Reactions  . Atorvastatin Other (See Comments)    myalgia     Current Outpatient Medications  Medication Sig Dispense Refill  . allopurinol (ZYLOPRIM) 100 MG tablet Take 100 mg by mouth at bedtime.     Marland Kitchen aspirin EC 81 MG tablet Take 1 tablet (81 mg total) by mouth daily.    . Carboxymethylcellulose Sodium (THERATEARS) 0.25 % SOLN Apply 1 drop to eye 2 (two) times daily as needed (dry/irritated eyes.).    Marland Kitchen Colchicine 0.6 MG CAPS Take 0.6 mg by mouth 2 (two) times daily as needed (gout flare ups).     . famotidine (PEPCID) 20 MG tablet Take 20 mg by mouth daily as needed for heartburn or  indigestion.    . fluticasone (FLONASE) 50 MCG/ACT nasal spray Place 2 sprays into the nose daily as needed (congestion).     . Multiple Vitamin (MULTIVITAMIN WITH MINERALS) TABS tablet Take 1 tablet by mouth daily.    . nitroGLYCERIN (NITROSTAT) 0.4 MG SL tablet TAKE 1 TABLET BY MOUTH UNDER TONGUE EVERY 5 MINUTES AS NEEDED (Patient taking differently: Place 0.4 mg under the tongue every 5 (five) minutes as needed for chest pain. ) 25 tablet 3  . pantoprazole (PROTONIX) 40 MG tablet Take 1 tablet (40 mg total) by mouth daily. 30 minutes before breakfast 90 tablet 3  . rosuvastatin (CRESTOR) 20 MG tablet Take 20 mg by mouth every evening.     . Wheat Dextrin (BENEFIBER) POWD Take by mouth 2 (two) times daily. 1 tbsp     No current facility-administered medications for this visit.      Past Surgical History:  Procedure Laterality Date  . BIOPSY  09/19/2018   Procedure: BIOPSY;  Surgeon: Daneil Dolin, MD;  [facility];  Service: Endoscopy;;  esophageal  . CARPAL TUNNEL RELEASE     left  . CATARACT EXTRACTION     Bilateral  . CERVICAL LAMINECTOMY     and diskectomy  . CORONARY ARTERY BYPASS GRAFT  X 4  . ESOPHAGOGASTRODUODENOSCOPY N/A 09/19/2018   Procedure: ESOPHAGOGASTRODUODENOSCOPY (EGD);  Surgeon: Daneil Dolin, MD;  Location: [facility];  Service: Endoscopy;  Laterality: N/A;  8:45am - office spoke with patient  . RHINOPLASTY     Nasal  . Ulnar Nerve Release from Left       Allergies  Allergen Reactions  . Atorvastatin Other (See Comments)    myalgia      Family History  Problem Relation Age of Onset  . Heart attack Father 64  . Heart attack Brother 24       Heavy smoker  . Colon cancer Neg Hx   . Colon polyps Neg Hx      Social History Mr. Calderon reports that he quit smoking about 33 years ago. His smoking use included cigars. He started smoking about 47 years ago. He has a 3.75 pack-year smoking history. He has quit using smokeless tobacco.   His smokeless tobacco use included chew. Mr. Harney reports current alcohol use.   Review of Systems CONSTITUTIONAL: No weight loss, fever, chills, weakness or fatigue.  HEENT: Eyes: No visual loss, blurred vision, double vision or yellow sclerae.No hearing loss, sneezing, congestion, runny nose or sore throat.  SKIN: No rash or itching.  CARDIOVASCULAR: per hpi RESPIRATORY: No shortness of breath, cough or sputum.  GASTROINTESTINAL: No anorexia, nausea, vomiting or diarrhea. No abdominal pain or blood.  GENITOURINARY: No burning on urination, no polyuria NEUROLOGICAL: No headache, dizziness, syncope, paralysis, ataxia, numbness or tingling in the extremities. No change in bowel or bladder control.  MUSCULOSKELETAL: No muscle, back pain, joint pain or stiffness.  LYMPHATICS: No enlarged nodes. No history of splenectomy.  PSYCHIATRIC: No history of depression or anxiety.  ENDOCRINOLOGIC: No reports of sweating, cold or heat intolerance. No polyuria or polydipsia.  Marland Kitchen   Physical Examination Today's Vitals   03/23/19 1035  BP: 120/80  Pulse: 81  SpO2: 98%  Weight: 221 lb (100.2 kg)  Height: 5\' 10"  (1.778 m)   Body mass index is 31.71 kg/m.  Gen: resting comfortably, no acute distress HEENT: no scleral icterus, pupils equal round and reactive, no palptable cervical adenopathy,  CV: RRR, no m/r/g, no jvd Resp: Clear to auscultation bilaterally GI: abdomen is soft, non-tender, non-distended, normal bowel sounds, no hepatosplenomegaly MSK: extremities are warm, no edema.  Skin: warm, no rash Neuro:  no focal deficits Psych: appropriate affect   Diagnostic Studies  Exercise MPI 02/2012: 10 min, 13 METs, Duke Treadmill score 10 low risk, no perfusion defects  04/2016 Nuclear stress  Blood pressure demonstrated a normal response to exercise.  There was no ST segment deviation noted during stress.  No T wave inversion was noted during stress.  The study is normal.  This  is a low risk study.  The left ventricular ejection fraction is hyperdynamic (>65%).  Normal resting and stress perfusion. No ischemia or infarction EF 67%  03/2017 AAA screen Final Interpretation: Abdominal Aorta: No evidence of an abdominal aortic aneurysm was visualized. The largest aortic measurement is 2.5 cm. IVC is patent.   Assessment and Plan  1.CAD  -no recent symptoms, continue to monitor - EKG today shows SR, no ischemic changes.   2. Hyperlipidemia  - followed closely by the patient's PCP Dr. Nadara Mustard, has tried mulitple statin regimens but has been limited due to myalgias currently on crestor.  -continue  Current statin, request labs from pcp  3. DVT - followed by oncllogy, upper extremity related to his port -  on eliquis, he will hold ASA while on eliquis, restart ASA when off eliquis   F/u 1 year      Arnoldo Lenis, M.D.

## 2019-03-23 NOTE — Patient Instructions (Signed)
Your physician wants you to follow-up in: [place] will receive a reminder letter in the mail two months in advance. If you don't receive a letter, please call our office to schedule the follow-up appointment.  STOP ASPIRIN WHILE TAKING ELIQUIS   RE START ASPIRIN 81 MG DAILY AFTER STOPPING ELIQUIS   Thank you for choosing [facility]!!

## 2019-03-27 ENCOUNTER — Encounter: Payer: Self-pay | Admitting: *Deleted

## 2019-04-25 ENCOUNTER — Ambulatory Visit: Payer: Medicare Other | Admitting: Gastroenterology

## 2019-05-08 ENCOUNTER — Encounter: Payer: Self-pay | Admitting: Gastroenterology

## 2019-05-08 ENCOUNTER — Other Ambulatory Visit: Payer: Self-pay

## 2019-05-08 ENCOUNTER — Ambulatory Visit: Payer: Medicare Other | Admitting: Gastroenterology

## 2019-05-08 VITALS — BP 121/75 | HR 70 | Temp 96.6°F | Ht 69.5 in | Wt 245.0 lb

## 2019-05-08 DIAGNOSIS — K649 Unspecified hemorrhoids: Secondary | ICD-10-CM | POA: Diagnosis not present

## 2019-05-08 NOTE — Patient Instructions (Signed)
I will reach out to your Oncologist regarding when it would be feasible to hold your blood thinner: it may mean a few more months before interrupting therapy.   Continue to avoid straining, avoid constipation, and we will be in touch shortly with a plan!  I enjoyed seeing you again today! As you know, I value our relationship and want to provide genuine, compassionate, and quality care. I welcome your feedback. If you receive a survey regarding your visit,  I greatly appreciate you taking time to fill this out. See you next time!  Annitta Needs, PhD, ANP-BC [facility]

## 2019-05-08 NOTE — Progress Notes (Addendum)
Referring Provider: Rory Percy, MD Primary Care Physician:  Rory Percy, MD  Primary GI: Dr. Gala Romney   Chief Complaint  Patient presents with  . Hemorrhoids    thinks he has another hemorrhoid    HPI:   Adrian Gibson is a 68 y.o. male presenting today with a history of esophageal adenocarcinoma, with pulmonary metastasis,  diagnosed earlier this year. He has been followed by [facility]. In interim from last seen, developed occlusive thrombus in the right IJ and right subclavian veins 12/26/2018 (associated with portacath), and placed on Eliquis. Due to staging, surgical resection is not an option. Now with goal of treatment for disease control.   Eliquis dosing may be decreased to prophylactic dosing after next PET/CT for staging.    Colonoscopy report received dated May 2017 by Dr. Ladona Horns. Findings of three semi-pedunculated polyps ranging between 5-9 mm in size 110 cm from point of entry, hemorrhoids. All tubular adenomas.Recommend surveillance 3-5 years (2020 to 2023).  Banding earlier this year of left lateral, right posterior, right anterior. He did well with this. Feels like some prolapsing with hemorrhoid. Multiple wiping to get clean. No rectal bleeding. Miralax just once per week. Stools are soft. No straining. No pain. Just annoying. He has no dysphagia. Tolerating oral intake without difficulty. In fact, he has gained weight.   Past Medical History:  Diagnosis Date  . CAD (coronary artery disease)    multivessel/ left main CAD.Marland Kitchen Four-vessel CABG 8/11... Normal left ventricular funciont  . Carpal tunnel syndrome   . Dyslipidemia   . Fusion of spine    Cervical vertebra fusion C5 C6 C7  . GERD (gastroesophageal reflux disease)   . Nasal polyps 2000   with rhinoplasty  . Sleep apnea     Past Surgical History:  Procedure Laterality Date  . BIOPSY  09/19/2018   Procedure: BIOPSY;  Surgeon: Daneil Dolin, MD;  [facility];  Service:  Endoscopy;;  esophageal  . CARPAL TUNNEL RELEASE     left  . CATARACT EXTRACTION     Bilateral  . CERVICAL LAMINECTOMY     and diskectomy  . CORONARY ARTERY BYPASS GRAFT     X 4  . ESOPHAGOGASTRODUODENOSCOPY N/A 09/19/2018   Procedure: ESOPHAGOGASTRODUODENOSCOPY (EGD);  Surgeon: Daneil Dolin, MD;  [facility];  Service: Endoscopy;  Laterality: N/A;  8:45am - office spoke with patient  . RHINOPLASTY     Nasal  . Ulnar Nerve Release from Left      Current Outpatient Medications  Medication Sig Dispense Refill  . allopurinol (ZYLOPRIM) 100 MG tablet Take 100 mg by mouth at bedtime.     Marland Kitchen apixaban (ELIQUIS) 5 MG TABS tablet Take 5 mg by mouth 2 (two) times daily.    . Carboxymethylcellulose Sodium (THERATEARS) 0.25 % SOLN Apply 1 drop to eye 2 (two) times daily as needed (dry/irritated eyes.).    Marland Kitchen Colchicine 0.6 MG CAPS Take 0.6 mg by mouth 2 (two) times daily as needed (gout flare ups).     . famotidine (PEPCID) 20 MG tablet Take 20 mg by mouth daily as needed for heartburn or indigestion.    . fluticasone (FLONASE) 50 MCG/ACT nasal spray Place 2 sprays into the nose daily as needed (congestion).     . Multiple Vitamin (MULTIVITAMIN WITH MINERALS) TABS tablet Take 1 tablet by mouth daily.    . nitroGLYCERIN (NITROSTAT) 0.4 MG SL tablet TAKE 1 TABLET BY MOUTH UNDER  TONGUE EVERY 5 MINUTES AS NEEDED (Patient taking differently: Place 0.4 mg under the tongue every 5 (five) minutes as needed for chest pain. ) 25 tablet 3  . pantoprazole (PROTONIX) 40 MG tablet Take 1 tablet (40 mg total) by mouth daily. 30 minutes before breakfast 90 tablet 3  . polyethylene glycol (MIRALAX / GLYCOLAX) 17 g packet Take 17 g by mouth daily as needed.    . pregabalin (LYRICA) 25 MG capsule Take 25 mg by mouth 3 (three) times daily.    . rosuvastatin (CRESTOR) 20 MG tablet Take 20 mg by mouth every evening.     Marland Kitchen aspirin EC 81 MG tablet Take 1 tablet (81 mg total) by mouth daily. (Patient not  taking: Reported on 05/08/2019)    . Wheat Dextrin (BENEFIBER) POWD Take by mouth 2 (two) times daily. 1 tbsp     No current facility-administered medications for this visit.     Allergies as of 05/08/2019 - Review Complete 05/08/2019  Allergen Reaction Noted  . Atorvastatin Other (See Comments) 04/26/2013    Family History  Problem Relation Age of Onset  . Heart attack Father 48  . Heart attack Brother 73       Heavy smoker  . Colon cancer Neg Hx   . Colon polyps Neg Hx     Social History   Socioeconomic History  . Marital status: Married    Spouse name: Not on file  . Number of children: Not on file  . Years of education: Not on file  . Highest education level: Not on file  Occupational History  . Occupation: Retired Physiological scientist    Comment: [employer] [phone]  Social Needs  . Financial resource strain: Not on file  . Food insecurity    Worry: Not on file    Inability: Not on file  . Transportation needs    Medical: Not on file    Non-medical: Not on file  Tobacco Use  . Smoking status: Former Smoker    Packs/day: 0.25    Years: 15.00    Pack years: 3.75    Types: Cigars    Start date: 07/10/1971    Quit date: 06/07/1985    Years since quitting: 33.9  . Smokeless tobacco: Former Systems developer    Types: Chew  . Tobacco comment: He has a remote history of smoking cigars, but he quit doing this several years ago.  Substance and Sexual Activity  . Alcohol use: Not Currently    Alcohol/week: 0.0 standard drinks    Comment: Occasional beer  . Drug use: Never  . Sexual activity: Not on file  Lifestyle  . Physical activity    Days per week: Not on file    Minutes per session: Not on file  . Stress: Not on file  Relationships  . Social Herbalist on phone: Not on file    Gets together: Not on file    Attends religious service: Not on file    Active member of club or organization: Not on file    Attends meetings of clubs or organizations:  Not on file    Relationship status: Not on file  Other Topics Concern  . Not on file  Social History Narrative   The patient lives in [place] with his wife.   Enjoys woodworking and fishing.   Has not been limited physically.    Review of Systems: Gen: Denies fever, chills, anorexia. Denies fatigue, weakness, weight loss.  CV: Denies chest pain, palpitations, syncope, peripheral edema, and claudication. Resp: Denies dyspnea at rest, cough, wheezing, coughing up blood, and pleurisy. GI: Denies vomiting blood, jaundice, and fecal incontinence.   Denies dysphagia or odynophagia. Derm: Denies rash, itching, dry skin Psych: Denies depression, anxiety, memory loss, confusion. No homicidal or suicidal ideation.  Heme: Denies bruising, bleeding, and enlarged lymph nodes.  Physical Exam: BP 121/75   Pulse 70   Temp (!) 96.6 F (35.9 C) (Temporal)   Ht 5' 9.5" (1.765 m)   Wt 245 lb (111.1 kg)   BMI 35.66 kg/m  General:   Alert and oriented. No distress noted. Pleasant and cooperative.  Head:  Normocephalic and atraumatic. Abdomen:  +BS, soft, non-tender and non-distended. No rebound or guarding. No HSM or masses noted. Rectal: external hemorrhoid tags,small external hemorrhoid without thrombosis, Internal exam with anoscope: left lateral hemorrhoid column with prominence  Msk:  Symmetrical without gross deformities. Normal posture. Extremities:  Without edema. Neurologic:  Alert and  oriented x4 Psych:  Alert and cooperative. Normal mood and affect.  ASSESSMENT/PLAN: AMJED STRIKE is a 68 y.o. male presenting today with history of esophageal cancer and pulmonary metastasis diagnosed earlier this year, undergoing chemo and followed at [facility]. At this point, treatment plan is for controlling symptoms and surgical resection not an option at this point. Prior to chemo starting, he had banding of internal hemorrhoids with good results. Returning today and requesting an additional  banding; however, he developed thrombosis of the right IJ and right subclavian veins in July 2020 (associated with porta-cath). Started on Eliquis at that time. There is consideration for Eliquis dosing to be decreased to prophylactic dosing after next PET/CT.    Hemorrhoid symptoms are not severe, and his report is more bothersome in nature. At this point, he is anticoagulated and it is unsafe to come off of this now in light of recent thrombosis (July 2020). Discussed that banding would require holding anticoagulation for a specified window of time, and this is not feasible currently. In the future, if banding were to be pursued, would need to coordinate this with Oncologist if patient is still on anticoagulation after appropriate treatment for thrombosis. Will reach out to Oncologist regarding potential duration of therapy. In the interim, I discussed with patient we would not be able to pursue any hemorrhoid banding today, and there is a possibility this may not be feasible in the future. Continue to avoid constipation, straining, and prolonged toilet time.   Annitta Needs, PhD, ANP-BC [facility]astroenter[facility]     Addendum: reviewed most recent Oncology note 06/06/2019. 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 Initially diagnosed in July 2020. Recommend supportive measures for hemorrhoids and not a banding candidate at this time.

## 2019-07-23 ENCOUNTER — Other Ambulatory Visit (HOSPITAL_COMMUNITY): Payer: Self-pay | Admitting: Oncology

## 2019-07-23 DIAGNOSIS — C155 Malignant neoplasm of lower third of esophagus: Secondary | ICD-10-CM

## 2019-08-06 ENCOUNTER — Encounter (HOSPITAL_COMMUNITY)
Admission: RE | Admit: 2019-08-06 | Discharge: 2019-08-06 | Disposition: A | Discharge status: Home / Self Care | Payer: Medicare PPO | Source: Ambulatory Visit | Attending: Oncology | Admitting: Oncology

## 2019-08-06 ENCOUNTER — Other Ambulatory Visit: Payer: Self-pay

## 2019-08-06 DIAGNOSIS — C155 Malignant neoplasm of lower third of esophagus: Secondary | ICD-10-CM | POA: Insufficient documentation

## 2019-08-06 MED ORDER — FLUDEOXYGLUCOSE F - 18 (FDG) INJECTION
13.2600 | Freq: Once | INTRAVENOUS | Status: AC | PRN
  Administered 2019-08-06: 13.26 via INTRAVENOUS

## 2019-10-09 IMAGING — PT NUCLEAR MEDICINE PET IMAGE RESTAGING (PS) SKULL BASE TO THIGH
3 series · 21 of 25 positions shown · non-contrast
Comparison: Multiple exams, including 10/02/2018

CLINICAL DATA: Subsequent treatment strategy for esophageal cancer.

EXAM:
NUCLEAR MEDICINE PET SKULL BASE TO THIGH
TECHNIQUE: 11.5 mCi F-18 FDG was injected intravenously. Full-ring PET imaging
was performed from the skull base to thigh after the radiotracer. CT
data was obtained and used for attenuation correction and anatomic
localization.
Fasting blood glucose: 90 mg/dl

[axial ct wb fusion · 15 of 194 slices shown]
[im 1/194]
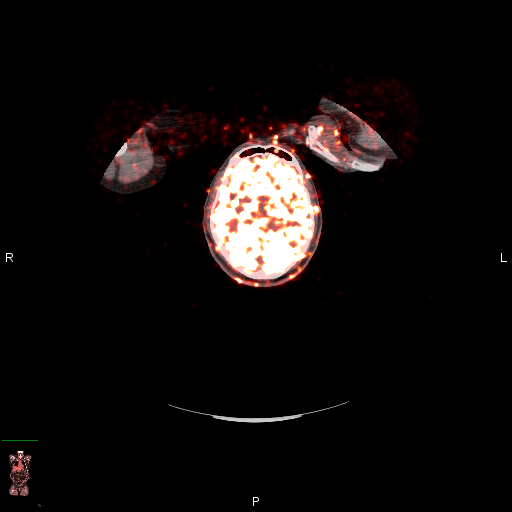
[im 12/194]
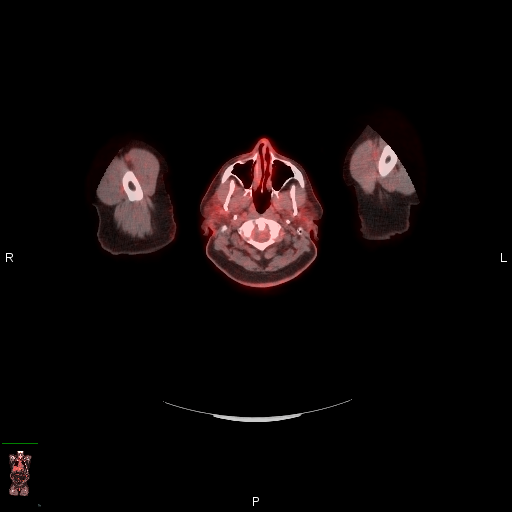
[im 23/194]
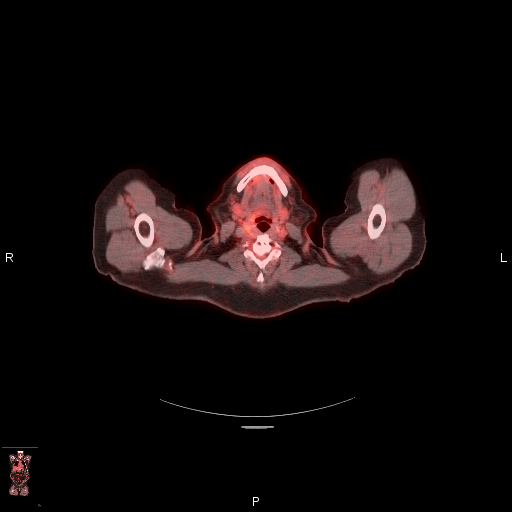
[im 46/194]
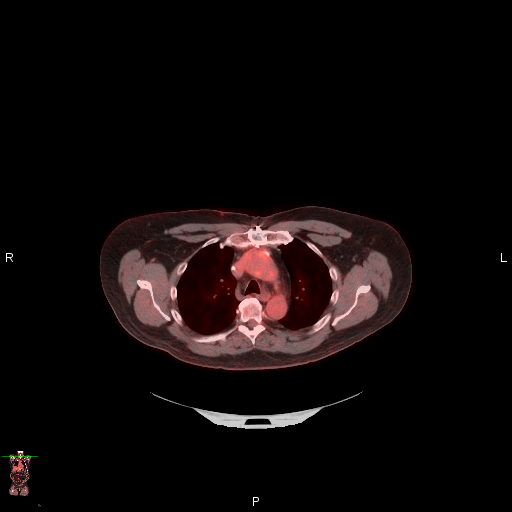
[im 57/194]
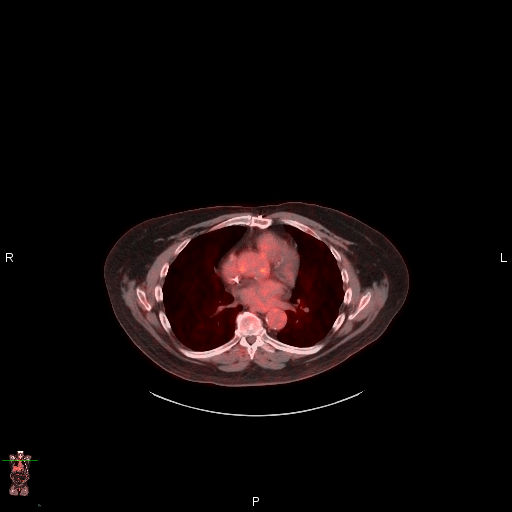
[im 69/194]
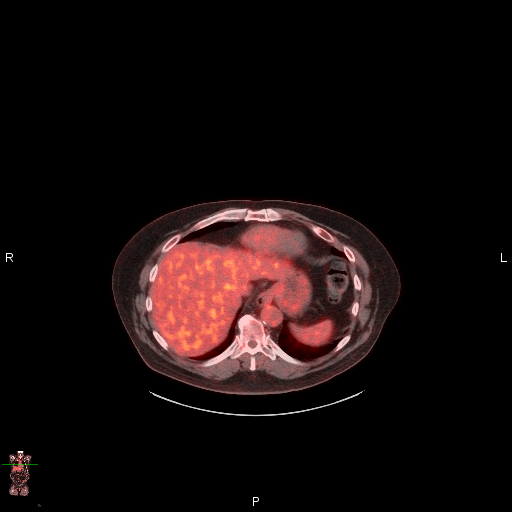
[im 80/194]
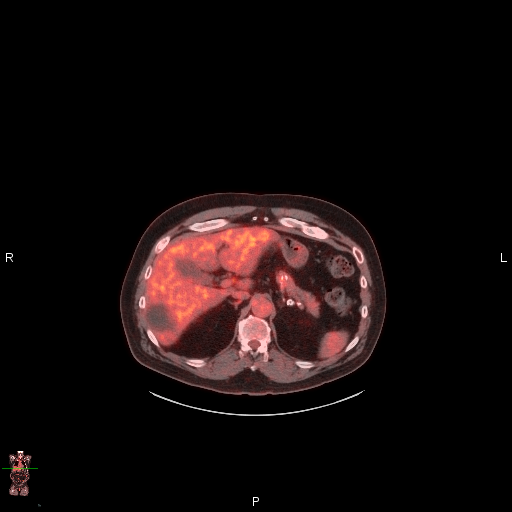
[im 91/194]
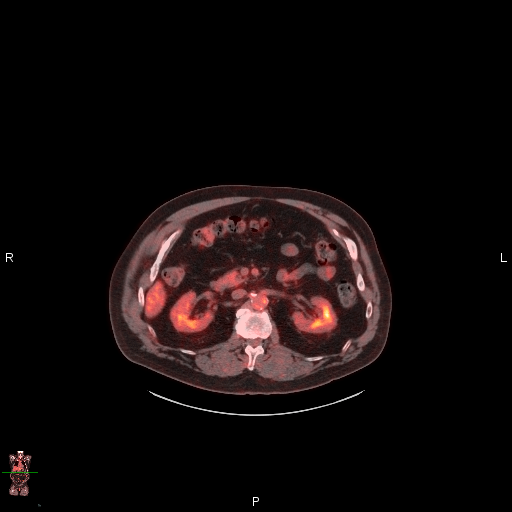
[im 114/194]
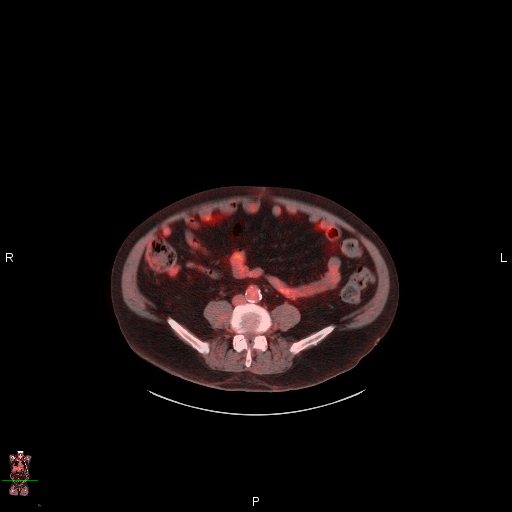
[im 125/194]
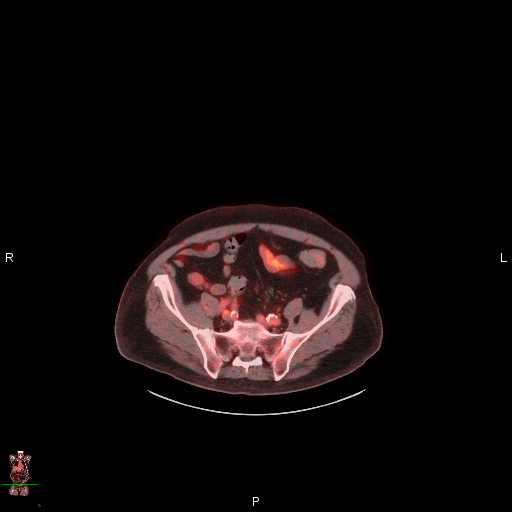
[im 137/194]
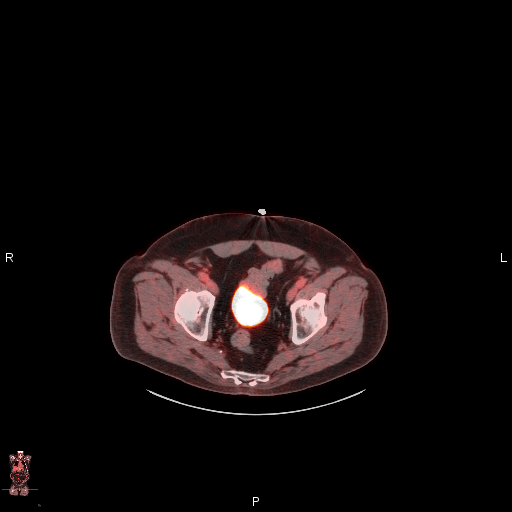
[im 148/194]
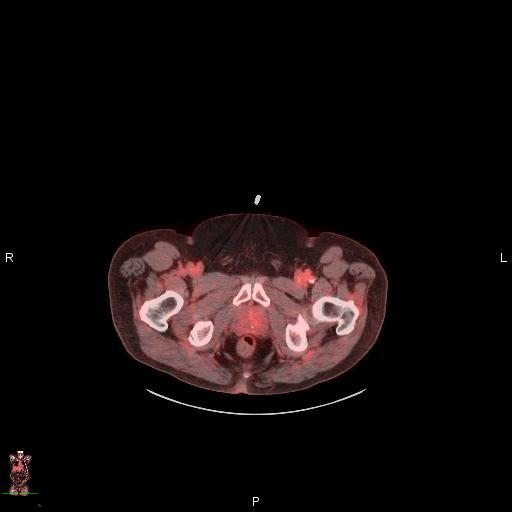
[im 159/194]
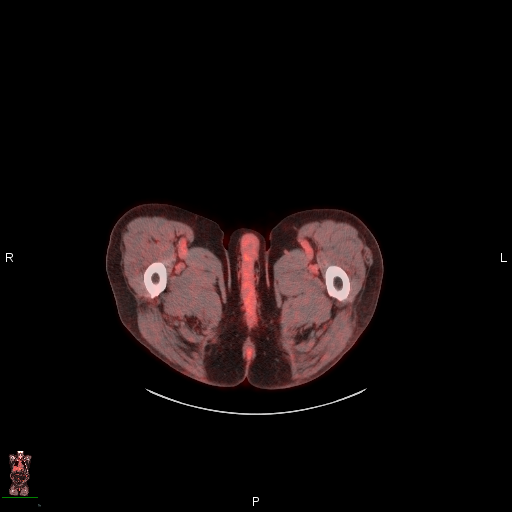
[im 182/194]
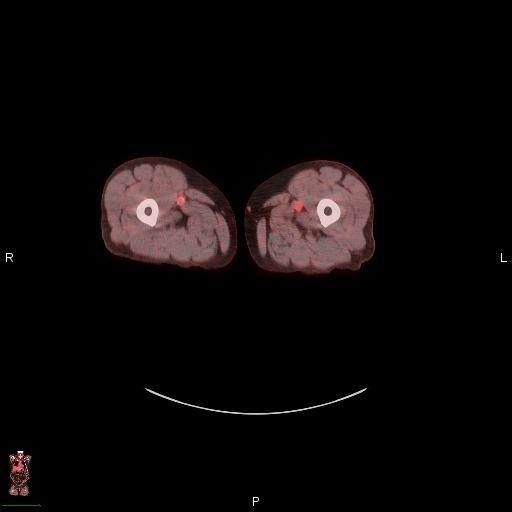
[im 194/194]
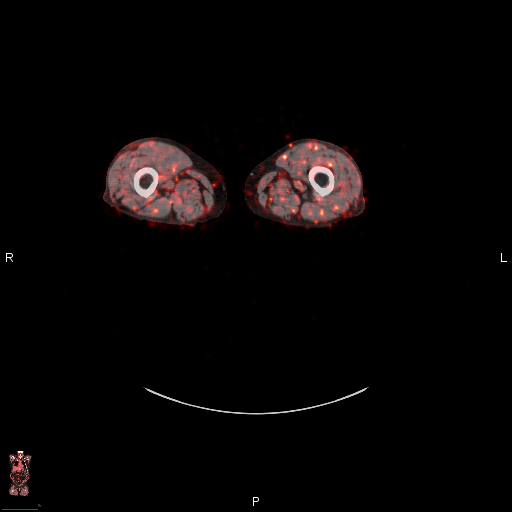

[mip · 3 of 48 slices shown]
[im 1/48]
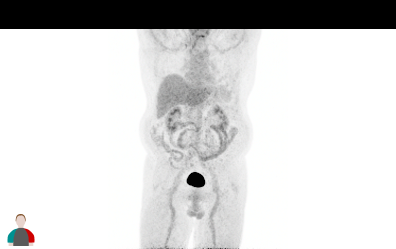
[im 16/48]
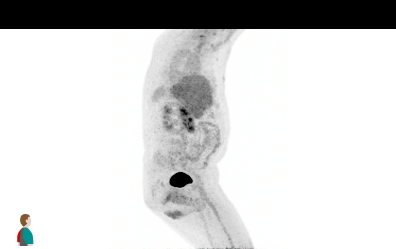
[im 32/48]
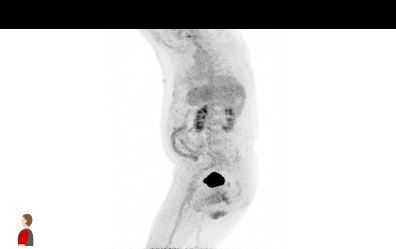

[coronal ct wb fusion · 3 of 33 slices shown]
[im 1/33]
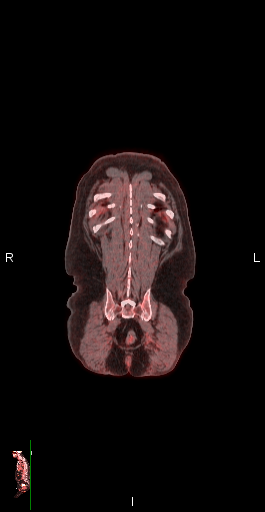
[im 17/33]
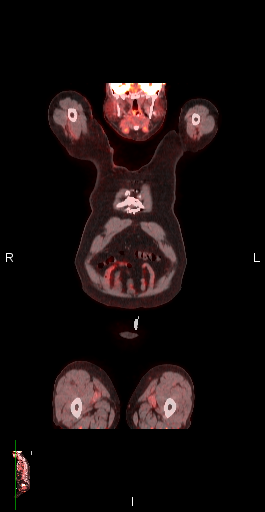
[im 33/33]
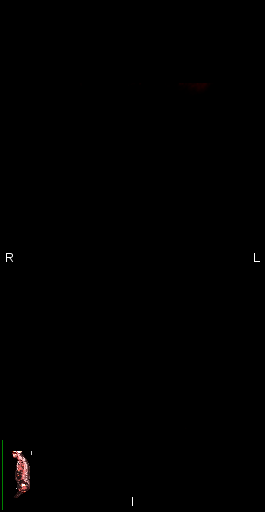

[21 of 25 positions shown; findings below may reference images not displayed]

FINDINGS: Mediastinal blood pool activity: SUV max

Liver activity: SUV max NA

NECK: No significant abnormal hypermetabolic activity in this
region.

Incidental CT findings: None

CHEST: The previous hypermetabolic activity of the distal esophagus
has essentially resolved. Maximum SUV today is 3.1, formerly 28.3.
There still potentially some mild wall thickening in the distal
esophagus.

About half of the previous pulmonary nodules have completely
resolved. The remaining nodules are smaller. For example, a right
lower lobe nodule measuring 0.4 cm in diameter on image 134/3
previously measured 0.7 cm in diameter. None of the remaining
nodules demonstrate accentuated metabolic activity, although these
nodules are technically too small to characterize. Previously the
nodules did appear to have accentuated metabolic activity.

Incidental CT findings: Right Port-A-Cath tip: Lower right atrium.

Coronary, aortic arch, and branch vessel atherosclerotic vascular
disease. Prior CABG.

ABDOMEN/PELVIS: Photopenic right hepatic lobe cyst, unchanged.

Incidental CT findings: Diffuse hepatic steatosis. Aortoiliac
atherosclerotic vascular disease. Mild prostatomegaly.

SKELETON: No significant abnormal hypermetabolic activity in this
region.

Incidental CT findings: Prior fusion at C4-C5-C6-C7. Chronic
ossification of the posterior longitudinal ligament at C2-C3-C4
potentially causing considerable impingement.
IMPRESSION: 1. The previous hypermetabolic activity in the distal esophagus has
essentially resolved, with maximum standard uptake value today of
3.1 (commensurate with physiologic activity elsewhere in the
esophagus), formerly 28.3. There is still some mild wall thickening
in the distal esophagus. Some of this may be treatment related.
2. About half of the prior small pulmonary nodules have resolved.
The remaining nodules are considerably reduced in size and no longer
demonstrate hypermetabolic activity.
3. Chronic ossification of the posterior longitudinal ligament in
the upper cervical spine, potentially causing considerable
impingement.
4. Other imaging findings of potential clinical significance: Aortic
Atherosclerosis ([id number]-[id number]). Coronary atherosclerosis. Prior CABG.
Diffuse hepatic steatosis. Photopenic right hepatic lobe cyst.
Prostatomegaly.

## 2019-12-27 ENCOUNTER — Encounter: Payer: Self-pay | Admitting: Cardiology

## 2020-05-07 DIAGNOSIS — C155 Malignant neoplasm of lower third of esophagus: Secondary | ICD-10-CM | POA: Diagnosis not present

## 2020-05-13 DIAGNOSIS — L570 Actinic keratosis: Secondary | ICD-10-CM | POA: Diagnosis not present

## 2020-05-13 DIAGNOSIS — L280 Lichen simplex chronicus: Secondary | ICD-10-CM | POA: Diagnosis not present

## 2020-05-15 DIAGNOSIS — C160 Malignant neoplasm of cardia: Secondary | ICD-10-CM | POA: Diagnosis not present

## 2020-05-15 DIAGNOSIS — Z510 Encounter for antineoplastic radiation therapy: Secondary | ICD-10-CM | POA: Diagnosis not present

## 2020-05-15 DIAGNOSIS — C155 Malignant neoplasm of lower third of esophagus: Secondary | ICD-10-CM | POA: Diagnosis not present

## 2020-05-21 DIAGNOSIS — K2289 Other specified disease of esophagus: Secondary | ICD-10-CM | POA: Diagnosis not present

## 2020-05-21 DIAGNOSIS — I517 Cardiomegaly: Secondary | ICD-10-CM | POA: Diagnosis not present

## 2020-05-21 DIAGNOSIS — R911 Solitary pulmonary nodule: Secondary | ICD-10-CM | POA: Diagnosis not present

## 2020-05-21 DIAGNOSIS — K760 Fatty (change of) liver, not elsewhere classified: Secondary | ICD-10-CM | POA: Diagnosis not present

## 2020-05-21 DIAGNOSIS — C159 Malignant neoplasm of esophagus, unspecified: Secondary | ICD-10-CM | POA: Diagnosis not present

## 2020-05-21 DIAGNOSIS — I700 Atherosclerosis of aorta: Secondary | ICD-10-CM | POA: Diagnosis not present

## 2020-05-21 DIAGNOSIS — C155 Malignant neoplasm of lower third of esophagus: Secondary | ICD-10-CM | POA: Diagnosis not present

## 2020-05-21 DIAGNOSIS — K7689 Other specified diseases of liver: Secondary | ICD-10-CM | POA: Diagnosis not present

## 2020-05-21 DIAGNOSIS — I2510 Atherosclerotic heart disease of native coronary artery without angina pectoris: Secondary | ICD-10-CM | POA: Diagnosis not present

## 2020-05-26 DIAGNOSIS — G620 Drug-induced polyneuropathy: Secondary | ICD-10-CM | POA: Diagnosis not present

## 2020-05-26 DIAGNOSIS — I82890 Acute embolism and thrombosis of other specified veins: Secondary | ICD-10-CM | POA: Diagnosis not present

## 2020-05-26 DIAGNOSIS — Z09 Encounter for follow-up examination after completed treatment for conditions other than malignant neoplasm: Secondary | ICD-10-CM | POA: Diagnosis not present

## 2020-05-26 DIAGNOSIS — C155 Malignant neoplasm of lower third of esophagus: Secondary | ICD-10-CM | POA: Diagnosis not present

## 2020-05-26 DIAGNOSIS — Z7189 Other specified counseling: Secondary | ICD-10-CM | POA: Diagnosis not present

## 2020-05-26 DIAGNOSIS — R5383 Other fatigue: Secondary | ICD-10-CM | POA: Diagnosis not present

## 2020-05-26 DIAGNOSIS — T451X5A Adverse effect of antineoplastic and immunosuppressive drugs, initial encounter: Secondary | ICD-10-CM | POA: Diagnosis not present

## 2020-05-26 DIAGNOSIS — Z7901 Long term (current) use of anticoagulants: Secondary | ICD-10-CM | POA: Diagnosis not present

## 2020-05-28 DIAGNOSIS — C155 Malignant neoplasm of lower third of esophagus: Secondary | ICD-10-CM | POA: Diagnosis not present

## 2020-05-28 DIAGNOSIS — Z5111 Encounter for antineoplastic chemotherapy: Secondary | ICD-10-CM | POA: Diagnosis not present

## 2020-05-28 DIAGNOSIS — Z95828 Presence of other vascular implants and grafts: Secondary | ICD-10-CM | POA: Diagnosis not present

## 2020-06-11 ENCOUNTER — Ambulatory Visit: Payer: Medicare PPO | Admitting: Cardiology

## 2020-06-11 ENCOUNTER — Encounter: Payer: Self-pay | Admitting: Cardiology

## 2020-06-11 VITALS — BP 106/64 | HR 60 | Resp 16 | Ht 69.0 in | Wt 241.6 lb

## 2020-06-11 DIAGNOSIS — E782 Mixed hyperlipidemia: Secondary | ICD-10-CM

## 2020-06-11 DIAGNOSIS — R072 Precordial pain: Secondary | ICD-10-CM

## 2020-06-11 DIAGNOSIS — I2510 Atherosclerotic heart disease of native coronary artery without angina pectoris: Secondary | ICD-10-CM | POA: Diagnosis not present

## 2020-06-11 NOTE — Progress Notes (Signed)
Clinical Summary Adrian Gibson is a 70 y.o.male seen today for follow up of the following medical problems.   1. CAD  - prior CABG 4 vessel 01/2010 at [facility], normal LVEF at that time.  - exercise MPI 02/2012: 10 min, 13 METs, Duke Treadmill score 10 low risk, no perfusion defects.  - 04/2016 nuclear stress: no ischemia - chronic left sided MSK pain ever since CABG    - 01/2019 echo [facility] LVEF 55-60%, mild LVH, aortic sclerosis.  - 05/2020 LVEF 55-60%  - no recent chest pains, occasional fullness that is chronic since his CABG - some recent DOE, for example walking up hill aggressively. Stable symptoms, more sedentary - compliant with meds   2. Hyperlipidemia  - has tried multiple different statins. Has worked with Dr Nadara Mustard to find a regimen.  - tolerating crestor 20mg  daily.  08/2016 TC 192 TG 132 HDL 42 LDL 124  - remains compliant with statin.    3. 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 History of DVT - compliant with eliquis  SH: had covid vaccine x 3 moderna  Past Medical History:  Diagnosis Date  . CAD (coronary artery disease)    multivessel/ left main CAD.Marland Kitchen Four-vessel CABG 8/11... Normal left ventricular funciont  . Carpal tunnel syndrome   . Dyslipidemia   . Fusion of spine    Cervical vertebra fusion C5 C6 C7  . GERD (gastroesophageal reflux disease)   . Nasal polyps 2000   with rhinoplasty  . Sleep apnea      Allergies  Allergen Reactions  . Atorvastatin Other (See Comments)    myalgia     Current Outpatient Medications  Medication Sig Dispense Refill  . allopurinol (ZYLOPRIM) 100 MG tablet Take 100 mg by mouth at bedtime.     Marland Kitchen apixaban (ELIQUIS) 5 MG TABS tablet Take 5 mg by mouth 2 (two) times daily.    . Carboxymethylcellulose  Sodium (THERATEARS) 0.25 % SOLN Apply 1 drop to eye 2 (two) times daily as needed (dry/irritated eyes.).    Marland Kitchen Colchicine 0.6 MG CAPS Take 0.6 mg by mouth 2 (two) times daily as needed (gout flare ups).     . famotidine (PEPCID) 20 MG tablet Take 20 mg by mouth daily as needed for heartburn or indigestion.    . fluticasone (FLONASE) 50 MCG/ACT nasal spray Place 2 sprays into the nose daily as needed (congestion).     . Multiple Vitamin (MULTIVITAMIN WITH MINERALS) TABS tablet Take 1 tablet by mouth daily.    . nitroGLYCERIN (NITROSTAT) 0.4 MG SL tablet TAKE 1 TABLET BY MOUTH UNDER TONGUE EVERY 5 MINUTES AS NEEDED (Patient taking differently: Place 0.4 mg under the tongue every 5 (five) minutes as needed for chest pain. ) 25 tablet 3  . pantoprazole (PROTONIX) 40 MG tablet Take 1 tablet (40 mg total) by mouth daily. 30 minutes before breakfast 90 tablet 3  . polyethylene glycol (MIRALAX / GLYCOLAX) 17 g packet Take 17 g by mouth daily as needed.    . pregabalin (LYRICA) 25 MG capsule Take 25 mg by mouth 3 (three) times daily.    . rosuvastatin (CRESTOR) 20 MG tablet Take 20 mg by mouth every evening.      No current facility-administered medications for this visit.  Past Surgical History:  Procedure Laterality Date  . BIOPSY  09/19/2018   Procedure: BIOPSY;  Surgeon: Daneil Dolin, MD;  [facility];  Service: Endoscopy;;  esophageal  . CARPAL TUNNEL RELEASE     left  . CATARACT EXTRACTION     Bilateral  . CERVICAL LAMINECTOMY     and diskectomy  . CORONARY ARTERY BYPASS GRAFT     X 4  . ESOPHAGOGASTRODUODENOSCOPY N/A 09/19/2018   Procedure: ESOPHAGOGASTRODUODENOSCOPY (EGD);  Surgeon: Daneil Dolin, MD;  Location: [facility];  Service: Endoscopy;  Laterality: N/A;  8:45am - office spoke with patient  . RHINOPLASTY     Nasal  . Ulnar Nerve Release from Left       Allergies  Allergen Reactions  . Atorvastatin Other (See Comments)    myalgia      Family  History  Problem Relation Age of Onset  . Heart attack Father 65  . Heart attack Brother 76       Heavy smoker  . Colon cancer Neg Hx   . Colon polyps Neg Hx      Social History Adrian Gibson reports that he quit smoking about 35 years ago. His smoking use included cigars. He started smoking about 48 years ago. He has a 3.75 pack-year smoking history. He has quit using smokeless tobacco.  His smokeless tobacco use included chew. Adrian Gibson reports previous alcohol use.   Review of Systems CONSTITUTIONAL: No weight loss, fever, chills, weakness or fatigue.  HEENT: Eyes: No visual loss, blurred vision, double vision or yellow sclerae.No hearing loss, sneezing, congestion, runny nose or sore throat.  SKIN: No rash or itching.  CARDIOVASCULAR: per hpi RESPIRATORY: No shortness of breath, cough or sputum.  GASTROINTESTINAL: No anorexia, nausea, vomiting or diarrhea. No abdominal pain or blood.  GENITOURINARY: No burning on urination, no polyuria NEUROLOGICAL: No headache, dizziness, syncope, paralysis, ataxia, numbness or tingling in the extremities. No change in bowel or bladder control.  MUSCULOSKELETAL: No muscle, back pain, joint pain or stiffness.  LYMPHATICS: No enlarged nodes. No history of splenectomy.  PSYCHIATRIC: No history of depression or anxiety.  ENDOCRINOLOGIC: No reports of sweating, cold or heat intolerance. No polyuria or polydipsia.  Marland Kitchen   Physical Examination Today's Vitals   06/11/20 0843  BP: 106/64  Pulse: 60  Resp: 16  SpO2: 98%  Weight: 241 lb 9.6 oz (109.6 kg)  Height: 5\' 9"  (1.753 m)   Body mass index is 35.68 kg/m.  Gen: resting comfortably, no acute distress HEENT: no scleral icterus, pupils equal round and reactive, no palptable cervical adenopathy,  CV: RRR, no m/r/g, no jvd Resp: Clear to auscultation bilaterally GI: abdomen is soft, non-tender, non-distended, normal bowel sounds, no hepatosplenomegaly MSK: extremities are warm, no edema.   Skin: warm, no rash Neuro:  no focal deficits Psych: appropriate affect   Diagnostic Studies Exercise MPI 02/2012: 10 min, 13 METs, Duke Treadmill score 10 low risk, no perfusion defects  04/2016 Nuclear stress  Blood pressure demonstrated a normal response to exercise.  There was no ST segment deviation noted during stress.  No T wave inversion was noted during stress.  The study is normal.  This is a low risk study.  The left ventricular ejection fraction is hyperdynamic (>65%).  Normal resting and stress perfusion. No ischemia or infarction EF 67%  03/2017 AAA screen Final Interpretation: Abdominal Aorta: No evidence of an abdominal aortic aneurysm was visualized. The largest aortic measurement is 2.5 cm. IVC  is patent.    Assessment and Plan  1.CAD  -no symptoms, continue current meds. Off ASA since on eliquis for prior DVT - EKG today SR, no acute ischemic changes - if considered for GI surgery likely would plan stress test for further risk statification.   2. Hyperlipidemia  - followed closely by the patient's PCP Dr. Dimas Aguas, has tried mulitple statin regimens but has been limited due to myalgias currently on crestor.  - continue crestor  3. DVT - followed by oncllogy, upper extremity related to his port - on eliquis per heme/onc      Antoine Poche, M.D.

## 2020-06-11 NOTE — Patient Instructions (Signed)
Medication Instructions:  Your physician recommends that you continue on your current medications as directed. Please refer to the Current Medication list given to you today.  *If you need a refill on your cardiac medications before your next appointment, please call your pharmacy*   Lab Work: None today If you have labs (blood work) drawn today and your tests are completely normal, you will receive your results only by: Marland Kitchen MyChart Message (if you have MyChart) OR . A paper copy in the mail If you have any lab test that is abnormal or we need to change your treatment, we will call you to review the results.   Testing/Procedures: None today   Follow-Up: At [facility], you and your health needs are our priority.  As part of our continuing mission to provide you with exceptional heart care, we have created designated Provider Care Teams.  These Care Teams include your primary Cardiologist (physician) and Advanced Practice Providers (APPs -  Physician Assistants and Nurse Practitioners) who all work together to provide you with the care you need, when you need it.  We recommend signing up for the patient portal called "MyChart".  Sign up information is provided on this After Visit Summary.  MyChart is used to connect with patients for Virtual Visits (Telemedicine).  Patients are able to view lab/test results, encounter notes, upcoming appointments, etc.  Non-urgent messages can be sent to your provider as well.   To learn more about what you can do with MyChart, go to [email or web address].    Your next appointment:   12 month(s)  The format for your next appointment:   In Person  Provider:   Dr.Branch   Other Instructions None      Thank you for choosing [facility] !

## 2020-06-12 DIAGNOSIS — J029 Acute pharyngitis, unspecified: Secondary | ICD-10-CM | POA: Diagnosis not present

## 2020-06-12 DIAGNOSIS — Z20828 Contact with and (suspected) exposure to other viral communicable diseases: Secondary | ICD-10-CM | POA: Diagnosis not present

## 2020-06-12 DIAGNOSIS — J329 Chronic sinusitis, unspecified: Secondary | ICD-10-CM | POA: Diagnosis not present

## 2020-06-16 DIAGNOSIS — Z09 Encounter for follow-up examination after completed treatment for conditions other than malignant neoplasm: Secondary | ICD-10-CM | POA: Diagnosis not present

## 2020-06-16 DIAGNOSIS — C155 Malignant neoplasm of lower third of esophagus: Secondary | ICD-10-CM | POA: Diagnosis not present

## 2020-06-16 DIAGNOSIS — Z7901 Long term (current) use of anticoagulants: Secondary | ICD-10-CM | POA: Diagnosis not present

## 2020-06-16 DIAGNOSIS — Z7189 Other specified counseling: Secondary | ICD-10-CM | POA: Diagnosis not present

## 2020-06-16 DIAGNOSIS — C159 Malignant neoplasm of esophagus, unspecified: Secondary | ICD-10-CM | POA: Diagnosis not present

## 2020-06-16 DIAGNOSIS — T451X5A Adverse effect of antineoplastic and immunosuppressive drugs, initial encounter: Secondary | ICD-10-CM | POA: Diagnosis not present

## 2020-06-16 DIAGNOSIS — R1319 Other dysphagia: Secondary | ICD-10-CM | POA: Diagnosis not present

## 2020-06-16 DIAGNOSIS — G620 Drug-induced polyneuropathy: Secondary | ICD-10-CM | POA: Diagnosis not present

## 2020-06-16 DIAGNOSIS — K229 Disease of esophagus, unspecified: Secondary | ICD-10-CM | POA: Diagnosis not present

## 2020-06-18 DIAGNOSIS — C155 Malignant neoplasm of lower third of esophagus: Secondary | ICD-10-CM | POA: Diagnosis not present

## 2020-06-18 DIAGNOSIS — Z95828 Presence of other vascular implants and grafts: Secondary | ICD-10-CM | POA: Diagnosis not present

## 2020-06-20 DIAGNOSIS — C155 Malignant neoplasm of lower third of esophagus: Secondary | ICD-10-CM | POA: Diagnosis not present

## 2020-06-20 DIAGNOSIS — R1310 Dysphagia, unspecified: Secondary | ICD-10-CM | POA: Diagnosis not present

## 2020-06-25 DIAGNOSIS — R1319 Other dysphagia: Secondary | ICD-10-CM | POA: Diagnosis not present

## 2020-06-25 DIAGNOSIS — G620 Drug-induced polyneuropathy: Secondary | ICD-10-CM | POA: Diagnosis not present

## 2020-06-25 DIAGNOSIS — I82890 Acute embolism and thrombosis of other specified veins: Secondary | ICD-10-CM | POA: Diagnosis not present

## 2020-06-25 DIAGNOSIS — C155 Malignant neoplasm of lower third of esophagus: Secondary | ICD-10-CM | POA: Diagnosis not present

## 2020-06-25 DIAGNOSIS — Z09 Encounter for follow-up examination after completed treatment for conditions other than malignant neoplasm: Secondary | ICD-10-CM | POA: Diagnosis not present

## 2020-06-25 DIAGNOSIS — Z7189 Other specified counseling: Secondary | ICD-10-CM | POA: Diagnosis not present

## 2020-06-25 DIAGNOSIS — T451X5A Adverse effect of antineoplastic and immunosuppressive drugs, initial encounter: Secondary | ICD-10-CM | POA: Diagnosis not present

## 2020-06-25 DIAGNOSIS — Z7901 Long term (current) use of anticoagulants: Secondary | ICD-10-CM | POA: Diagnosis not present

## 2020-07-08 DIAGNOSIS — G620 Drug-induced polyneuropathy: Secondary | ICD-10-CM | POA: Diagnosis not present

## 2020-07-08 DIAGNOSIS — T451X5A Adverse effect of antineoplastic and immunosuppressive drugs, initial encounter: Secondary | ICD-10-CM | POA: Diagnosis not present

## 2020-07-08 DIAGNOSIS — C155 Malignant neoplasm of lower third of esophagus: Secondary | ICD-10-CM | POA: Diagnosis not present

## 2020-07-08 DIAGNOSIS — Z09 Encounter for follow-up examination after completed treatment for conditions other than malignant neoplasm: Secondary | ICD-10-CM | POA: Diagnosis not present

## 2020-07-08 DIAGNOSIS — Z7901 Long term (current) use of anticoagulants: Secondary | ICD-10-CM | POA: Diagnosis not present

## 2020-07-08 DIAGNOSIS — I82890 Acute embolism and thrombosis of other specified veins: Secondary | ICD-10-CM | POA: Diagnosis not present

## 2020-07-08 DIAGNOSIS — Z7189 Other specified counseling: Secondary | ICD-10-CM | POA: Diagnosis not present

## 2020-07-09 DIAGNOSIS — C155 Malignant neoplasm of lower third of esophagus: Secondary | ICD-10-CM | POA: Diagnosis not present

## 2020-07-09 DIAGNOSIS — Z95828 Presence of other vascular implants and grafts: Secondary | ICD-10-CM | POA: Diagnosis not present

## 2020-07-25 DIAGNOSIS — G4733 Obstructive sleep apnea (adult) (pediatric): Secondary | ICD-10-CM | POA: Diagnosis not present

## 2020-07-29 DIAGNOSIS — T451X5A Adverse effect of antineoplastic and immunosuppressive drugs, initial encounter: Secondary | ICD-10-CM | POA: Diagnosis not present

## 2020-07-29 DIAGNOSIS — C155 Malignant neoplasm of lower third of esophagus: Secondary | ICD-10-CM | POA: Diagnosis not present

## 2020-07-29 DIAGNOSIS — Z09 Encounter for follow-up examination after completed treatment for conditions other than malignant neoplasm: Secondary | ICD-10-CM | POA: Diagnosis not present

## 2020-07-29 DIAGNOSIS — I82890 Acute embolism and thrombosis of other specified veins: Secondary | ICD-10-CM | POA: Diagnosis not present

## 2020-07-29 DIAGNOSIS — Z7901 Long term (current) use of anticoagulants: Secondary | ICD-10-CM | POA: Diagnosis not present

## 2020-07-29 DIAGNOSIS — Z7189 Other specified counseling: Secondary | ICD-10-CM | POA: Diagnosis not present

## 2020-07-29 DIAGNOSIS — G620 Drug-induced polyneuropathy: Secondary | ICD-10-CM | POA: Diagnosis not present

## 2020-07-30 DIAGNOSIS — C155 Malignant neoplasm of lower third of esophagus: Secondary | ICD-10-CM | POA: Diagnosis not present

## 2020-07-30 DIAGNOSIS — Z5112 Encounter for antineoplastic immunotherapy: Secondary | ICD-10-CM | POA: Diagnosis not present

## 2020-08-05 DIAGNOSIS — C155 Malignant neoplasm of lower third of esophagus: Secondary | ICD-10-CM | POA: Diagnosis not present

## 2020-08-19 DIAGNOSIS — I82890 Acute embolism and thrombosis of other specified veins: Secondary | ICD-10-CM | POA: Diagnosis not present

## 2020-08-19 DIAGNOSIS — G620 Drug-induced polyneuropathy: Secondary | ICD-10-CM | POA: Diagnosis not present

## 2020-08-19 DIAGNOSIS — Z09 Encounter for follow-up examination after completed treatment for conditions other than malignant neoplasm: Secondary | ICD-10-CM | POA: Diagnosis not present

## 2020-08-19 DIAGNOSIS — C155 Malignant neoplasm of lower third of esophagus: Secondary | ICD-10-CM | POA: Diagnosis not present

## 2020-08-19 DIAGNOSIS — Z7189 Other specified counseling: Secondary | ICD-10-CM | POA: Diagnosis not present

## 2020-08-19 DIAGNOSIS — Z7901 Long term (current) use of anticoagulants: Secondary | ICD-10-CM | POA: Diagnosis not present

## 2020-08-19 DIAGNOSIS — T451X5A Adverse effect of antineoplastic and immunosuppressive drugs, initial encounter: Secondary | ICD-10-CM | POA: Diagnosis not present

## 2020-08-22 DIAGNOSIS — I82890 Acute embolism and thrombosis of other specified veins: Secondary | ICD-10-CM | POA: Diagnosis not present

## 2020-08-22 DIAGNOSIS — T451X5A Adverse effect of antineoplastic and immunosuppressive drugs, initial encounter: Secondary | ICD-10-CM | POA: Diagnosis not present

## 2020-08-22 DIAGNOSIS — Z01818 Encounter for other preprocedural examination: Secondary | ICD-10-CM | POA: Diagnosis not present

## 2020-08-22 DIAGNOSIS — I2510 Atherosclerotic heart disease of native coronary artery without angina pectoris: Secondary | ICD-10-CM | POA: Diagnosis not present

## 2020-08-22 DIAGNOSIS — Z01812 Encounter for preprocedural laboratory examination: Secondary | ICD-10-CM | POA: Diagnosis not present

## 2020-08-22 DIAGNOSIS — Z419 Encounter for procedure for purposes other than remedying health state, unspecified: Secondary | ICD-10-CM | POA: Diagnosis not present

## 2020-08-22 DIAGNOSIS — K219 Gastro-esophageal reflux disease without esophagitis: Secondary | ICD-10-CM | POA: Diagnosis not present

## 2020-08-22 DIAGNOSIS — G620 Drug-induced polyneuropathy: Secondary | ICD-10-CM | POA: Diagnosis not present

## 2020-08-22 DIAGNOSIS — Z20822 Contact with and (suspected) exposure to covid-19: Secondary | ICD-10-CM | POA: Diagnosis not present

## 2020-08-22 DIAGNOSIS — G4730 Sleep apnea, unspecified: Secondary | ICD-10-CM | POA: Diagnosis not present

## 2020-08-26 DIAGNOSIS — R9389 Abnormal findings on diagnostic imaging of other specified body structures: Secondary | ICD-10-CM | POA: Diagnosis not present

## 2020-08-26 DIAGNOSIS — K219 Gastro-esophageal reflux disease without esophagitis: Secondary | ICD-10-CM | POA: Diagnosis not present

## 2020-08-26 DIAGNOSIS — C158 Malignant neoplasm of overlapping sites of esophagus: Secondary | ICD-10-CM | POA: Diagnosis not present

## 2020-08-26 DIAGNOSIS — C155 Malignant neoplasm of lower third of esophagus: Secondary | ICD-10-CM | POA: Diagnosis not present

## 2020-08-26 DIAGNOSIS — Z9049 Acquired absence of other specified parts of digestive tract: Secondary | ICD-10-CM | POA: Diagnosis not present

## 2020-08-26 DIAGNOSIS — J939 Pneumothorax, unspecified: Secondary | ICD-10-CM | POA: Diagnosis not present

## 2020-08-26 DIAGNOSIS — E7800 Pure hypercholesterolemia, unspecified: Secondary | ICD-10-CM | POA: Diagnosis not present

## 2020-08-26 DIAGNOSIS — R0989 Other specified symptoms and signs involving the circulatory and respiratory systems: Secondary | ICD-10-CM | POA: Diagnosis not present

## 2020-08-26 DIAGNOSIS — Z4682 Encounter for fitting and adjustment of non-vascular catheter: Secondary | ICD-10-CM | POA: Diagnosis not present

## 2020-08-26 DIAGNOSIS — R059 Cough, unspecified: Secondary | ICD-10-CM | POA: Diagnosis not present

## 2020-08-26 DIAGNOSIS — I2510 Atherosclerotic heart disease of native coronary artery without angina pectoris: Secondary | ICD-10-CM | POA: Diagnosis not present

## 2020-08-26 DIAGNOSIS — C771 Secondary and unspecified malignant neoplasm of intrathoracic lymph nodes: Secondary | ICD-10-CM | POA: Diagnosis not present

## 2020-08-26 DIAGNOSIS — C159 Malignant neoplasm of esophagus, unspecified: Secondary | ICD-10-CM | POA: Diagnosis not present

## 2020-08-26 DIAGNOSIS — R918 Other nonspecific abnormal finding of lung field: Secondary | ICD-10-CM | POA: Diagnosis not present

## 2020-08-26 DIAGNOSIS — G4730 Sleep apnea, unspecified: Secondary | ICD-10-CM | POA: Diagnosis not present

## 2020-08-26 DIAGNOSIS — J9811 Atelectasis: Secondary | ICD-10-CM | POA: Diagnosis not present

## 2020-08-26 DIAGNOSIS — G8918 Other acute postprocedural pain: Secondary | ICD-10-CM | POA: Diagnosis not present

## 2020-08-26 DIAGNOSIS — I82C11 Acute embolism and thrombosis of right internal jugular vein: Secondary | ICD-10-CM | POA: Diagnosis not present

## 2020-08-26 DIAGNOSIS — J90 Pleural effusion, not elsewhere classified: Secondary | ICD-10-CM | POA: Diagnosis not present

## 2020-09-09 DIAGNOSIS — I119 Hypertensive heart disease without heart failure: Secondary | ICD-10-CM | POA: Diagnosis not present

## 2020-09-09 DIAGNOSIS — R1310 Dysphagia, unspecified: Secondary | ICD-10-CM | POA: Diagnosis not present

## 2020-09-09 DIAGNOSIS — C155 Malignant neoplasm of lower third of esophagus: Secondary | ICD-10-CM | POA: Diagnosis not present

## 2020-09-09 DIAGNOSIS — G620 Drug-induced polyneuropathy: Secondary | ICD-10-CM | POA: Diagnosis not present

## 2020-09-09 DIAGNOSIS — Z434 Encounter for attention to other artificial openings of digestive tract: Secondary | ICD-10-CM | POA: Diagnosis not present

## 2020-09-09 DIAGNOSIS — T451X5D Adverse effect of antineoplastic and immunosuppressive drugs, subsequent encounter: Secondary | ICD-10-CM | POA: Diagnosis not present

## 2020-09-09 DIAGNOSIS — I2510 Atherosclerotic heart disease of native coronary artery without angina pectoris: Secondary | ICD-10-CM | POA: Diagnosis not present

## 2020-09-09 DIAGNOSIS — Z483 Aftercare following surgery for neoplasm: Secondary | ICD-10-CM | POA: Diagnosis not present

## 2020-09-09 DIAGNOSIS — M109 Gout, unspecified: Secondary | ICD-10-CM | POA: Diagnosis not present

## 2020-09-12 DIAGNOSIS — C155 Malignant neoplasm of lower third of esophagus: Secondary | ICD-10-CM | POA: Diagnosis not present

## 2020-09-12 DIAGNOSIS — Z09 Encounter for follow-up examination after completed treatment for conditions other than malignant neoplasm: Secondary | ICD-10-CM | POA: Diagnosis not present

## 2020-09-16 DIAGNOSIS — I2510 Atherosclerotic heart disease of native coronary artery without angina pectoris: Secondary | ICD-10-CM | POA: Diagnosis not present

## 2020-09-16 DIAGNOSIS — Z6835 Body mass index (BMI) 35.0-35.9, adult: Secondary | ICD-10-CM | POA: Diagnosis not present

## 2020-09-16 DIAGNOSIS — I119 Hypertensive heart disease without heart failure: Secondary | ICD-10-CM | POA: Diagnosis not present

## 2020-09-16 DIAGNOSIS — C155 Malignant neoplasm of lower third of esophagus: Secondary | ICD-10-CM | POA: Diagnosis not present

## 2020-09-16 DIAGNOSIS — M109 Gout, unspecified: Secondary | ICD-10-CM | POA: Diagnosis not present

## 2020-09-16 DIAGNOSIS — L89301 Pressure ulcer of unspecified buttock, stage 1: Secondary | ICD-10-CM | POA: Diagnosis not present

## 2020-09-16 DIAGNOSIS — C159 Malignant neoplasm of esophagus, unspecified: Secondary | ICD-10-CM | POA: Diagnosis not present

## 2020-09-16 DIAGNOSIS — Z434 Encounter for attention to other artificial openings of digestive tract: Secondary | ICD-10-CM | POA: Diagnosis not present

## 2020-09-16 DIAGNOSIS — T451X5D Adverse effect of antineoplastic and immunosuppressive drugs, subsequent encounter: Secondary | ICD-10-CM | POA: Diagnosis not present

## 2020-09-16 DIAGNOSIS — Z483 Aftercare following surgery for neoplasm: Secondary | ICD-10-CM | POA: Diagnosis not present

## 2020-09-16 DIAGNOSIS — R1310 Dysphagia, unspecified: Secondary | ICD-10-CM | POA: Diagnosis not present

## 2020-09-16 DIAGNOSIS — G620 Drug-induced polyneuropathy: Secondary | ICD-10-CM | POA: Diagnosis not present

## 2020-09-18 DIAGNOSIS — G909 Disorder of the autonomic nervous system, unspecified: Secondary | ICD-10-CM | POA: Diagnosis not present

## 2020-09-18 DIAGNOSIS — C155 Malignant neoplasm of lower third of esophagus: Secondary | ICD-10-CM | POA: Diagnosis not present

## 2020-09-18 DIAGNOSIS — Z7901 Long term (current) use of anticoagulants: Secondary | ICD-10-CM | POA: Diagnosis not present

## 2020-09-18 DIAGNOSIS — Z931 Gastrostomy status: Secondary | ICD-10-CM | POA: Diagnosis not present

## 2020-09-18 DIAGNOSIS — Z09 Encounter for follow-up examination after completed treatment for conditions other than malignant neoplasm: Secondary | ICD-10-CM | POA: Diagnosis not present

## 2020-09-18 DIAGNOSIS — R1319 Other dysphagia: Secondary | ICD-10-CM | POA: Diagnosis not present

## 2020-09-18 DIAGNOSIS — E43 Unspecified severe protein-calorie malnutrition: Secondary | ICD-10-CM | POA: Diagnosis not present

## 2020-09-18 DIAGNOSIS — Z08 Encounter for follow-up examination after completed treatment for malignant neoplasm: Secondary | ICD-10-CM | POA: Diagnosis not present

## 2020-09-18 DIAGNOSIS — K5903 Drug induced constipation: Secondary | ICD-10-CM | POA: Diagnosis not present

## 2020-09-19 DIAGNOSIS — Z434 Encounter for attention to other artificial openings of digestive tract: Secondary | ICD-10-CM | POA: Diagnosis not present

## 2020-09-19 DIAGNOSIS — G620 Drug-induced polyneuropathy: Secondary | ICD-10-CM | POA: Diagnosis not present

## 2020-09-19 DIAGNOSIS — Z483 Aftercare following surgery for neoplasm: Secondary | ICD-10-CM | POA: Diagnosis not present

## 2020-09-19 DIAGNOSIS — I2510 Atherosclerotic heart disease of native coronary artery without angina pectoris: Secondary | ICD-10-CM | POA: Diagnosis not present

## 2020-09-19 DIAGNOSIS — T451X5D Adverse effect of antineoplastic and immunosuppressive drugs, subsequent encounter: Secondary | ICD-10-CM | POA: Diagnosis not present

## 2020-09-19 DIAGNOSIS — R1310 Dysphagia, unspecified: Secondary | ICD-10-CM | POA: Diagnosis not present

## 2020-09-19 DIAGNOSIS — C158 Malignant neoplasm of overlapping sites of esophagus: Secondary | ICD-10-CM | POA: Diagnosis not present

## 2020-09-19 DIAGNOSIS — C155 Malignant neoplasm of lower third of esophagus: Secondary | ICD-10-CM | POA: Diagnosis not present

## 2020-09-19 DIAGNOSIS — M109 Gout, unspecified: Secondary | ICD-10-CM | POA: Diagnosis not present

## 2020-09-19 DIAGNOSIS — I119 Hypertensive heart disease without heart failure: Secondary | ICD-10-CM | POA: Diagnosis not present

## 2020-09-21 DIAGNOSIS — J189 Pneumonia, unspecified organism: Secondary | ICD-10-CM | POA: Diagnosis not present

## 2020-09-21 DIAGNOSIS — I2510 Atherosclerotic heart disease of native coronary artery without angina pectoris: Secondary | ICD-10-CM | POA: Diagnosis not present

## 2020-09-21 DIAGNOSIS — J9811 Atelectasis: Secondary | ICD-10-CM | POA: Diagnosis not present

## 2020-09-21 DIAGNOSIS — Z87891 Personal history of nicotine dependence: Secondary | ICD-10-CM | POA: Diagnosis not present

## 2020-09-21 DIAGNOSIS — Z8501 Personal history of malignant neoplasm of esophagus: Secondary | ICD-10-CM | POA: Diagnosis not present

## 2020-09-21 DIAGNOSIS — K219 Gastro-esophageal reflux disease without esophagitis: Secondary | ICD-10-CM | POA: Diagnosis not present

## 2020-09-21 DIAGNOSIS — M109 Gout, unspecified: Secondary | ICD-10-CM | POA: Diagnosis not present

## 2020-09-21 DIAGNOSIS — R059 Cough, unspecified: Secondary | ICD-10-CM | POA: Diagnosis not present

## 2020-09-21 DIAGNOSIS — Z951 Presence of aortocoronary bypass graft: Secondary | ICD-10-CM | POA: Diagnosis not present

## 2020-09-21 DIAGNOSIS — Z931 Gastrostomy status: Secondary | ICD-10-CM | POA: Diagnosis not present

## 2020-09-21 DIAGNOSIS — E7800 Pure hypercholesterolemia, unspecified: Secondary | ICD-10-CM | POA: Diagnosis not present

## 2020-09-23 DIAGNOSIS — R0902 Hypoxemia: Secondary | ICD-10-CM | POA: Diagnosis not present

## 2020-09-23 DIAGNOSIS — J690 Pneumonitis due to inhalation of food and vomit: Secondary | ICD-10-CM | POA: Diagnosis not present

## 2020-09-23 DIAGNOSIS — E871 Hypo-osmolality and hyponatremia: Secondary | ICD-10-CM | POA: Diagnosis not present

## 2020-09-23 DIAGNOSIS — M6250 Muscle wasting and atrophy, not elsewhere classified, unspecified site: Secondary | ICD-10-CM | POA: Diagnosis not present

## 2020-09-23 DIAGNOSIS — C158 Malignant neoplasm of overlapping sites of esophagus: Secondary | ICD-10-CM | POA: Diagnosis not present

## 2020-09-23 DIAGNOSIS — K2289 Other specified disease of esophagus: Secondary | ICD-10-CM | POA: Diagnosis not present

## 2020-09-23 DIAGNOSIS — Z79899 Other long term (current) drug therapy: Secondary | ICD-10-CM | POA: Diagnosis not present

## 2020-09-23 DIAGNOSIS — K229 Disease of esophagus, unspecified: Secondary | ICD-10-CM | POA: Diagnosis not present

## 2020-09-23 DIAGNOSIS — Z980 Intestinal bypass and anastomosis status: Secondary | ICD-10-CM | POA: Diagnosis not present

## 2020-09-23 DIAGNOSIS — Z7901 Long term (current) use of anticoagulants: Secondary | ICD-10-CM | POA: Diagnosis not present

## 2020-09-23 DIAGNOSIS — K219 Gastro-esophageal reflux disease without esophagitis: Secondary | ICD-10-CM | POA: Diagnosis not present

## 2020-09-23 DIAGNOSIS — G4730 Sleep apnea, unspecified: Secondary | ICD-10-CM | POA: Diagnosis not present

## 2020-09-23 DIAGNOSIS — I2699 Other pulmonary embolism without acute cor pulmonale: Secondary | ICD-10-CM | POA: Diagnosis not present

## 2020-09-23 DIAGNOSIS — J90 Pleural effusion, not elsewhere classified: Secondary | ICD-10-CM | POA: Diagnosis not present

## 2020-09-23 DIAGNOSIS — K9413 Enterostomy malfunction: Secondary | ICD-10-CM | POA: Diagnosis not present

## 2020-09-23 DIAGNOSIS — R531 Weakness: Secondary | ICD-10-CM | POA: Diagnosis not present

## 2020-09-23 DIAGNOSIS — R0602 Shortness of breath: Secondary | ICD-10-CM | POA: Diagnosis not present

## 2020-09-23 DIAGNOSIS — A419 Sepsis, unspecified organism: Secondary | ICD-10-CM | POA: Diagnosis not present

## 2020-09-23 DIAGNOSIS — I2510 Atherosclerotic heart disease of native coronary artery without angina pectoris: Secondary | ICD-10-CM | POA: Diagnosis not present

## 2020-09-23 DIAGNOSIS — R059 Cough, unspecified: Secondary | ICD-10-CM | POA: Diagnosis not present

## 2020-09-23 DIAGNOSIS — R918 Other nonspecific abnormal finding of lung field: Secondary | ICD-10-CM | POA: Diagnosis not present

## 2020-09-23 DIAGNOSIS — K3189 Other diseases of stomach and duodenum: Secondary | ICD-10-CM | POA: Diagnosis not present

## 2020-09-23 DIAGNOSIS — C159 Malignant neoplasm of esophagus, unspecified: Secondary | ICD-10-CM | POA: Diagnosis not present

## 2020-09-23 DIAGNOSIS — Z20822 Contact with and (suspected) exposure to covid-19: Secondary | ICD-10-CM | POA: Diagnosis not present

## 2020-09-23 DIAGNOSIS — E441 Mild protein-calorie malnutrition: Secondary | ICD-10-CM | POA: Diagnosis not present

## 2020-09-23 DIAGNOSIS — K9189 Other postprocedural complications and disorders of digestive system: Secondary | ICD-10-CM | POA: Diagnosis not present

## 2020-09-23 DIAGNOSIS — Z01818 Encounter for other preprocedural examination: Secondary | ICD-10-CM | POA: Diagnosis not present

## 2020-09-23 DIAGNOSIS — K223 Perforation of esophagus: Secondary | ICD-10-CM | POA: Diagnosis not present

## 2020-09-23 DIAGNOSIS — R079 Chest pain, unspecified: Secondary | ICD-10-CM | POA: Diagnosis not present

## 2020-09-23 DIAGNOSIS — Z9049 Acquired absence of other specified parts of digestive tract: Secondary | ICD-10-CM | POA: Diagnosis not present

## 2020-09-23 DIAGNOSIS — K9429 Other complications of gastrostomy: Secondary | ICD-10-CM | POA: Diagnosis not present

## 2020-09-23 DIAGNOSIS — Z8501 Personal history of malignant neoplasm of esophagus: Secondary | ICD-10-CM | POA: Diagnosis not present

## 2020-09-23 DIAGNOSIS — R933 Abnormal findings on diagnostic imaging of other parts of digestive tract: Secondary | ICD-10-CM | POA: Diagnosis not present

## 2020-09-23 DIAGNOSIS — C155 Malignant neoplasm of lower third of esophagus: Secondary | ICD-10-CM | POA: Diagnosis not present

## 2020-09-23 DIAGNOSIS — J9811 Atelectasis: Secondary | ICD-10-CM | POA: Diagnosis not present

## 2020-09-23 DIAGNOSIS — G130 Paraneoplastic neuromyopathy and neuropathy: Secondary | ICD-10-CM | POA: Diagnosis not present

## 2020-09-23 DIAGNOSIS — M109 Gout, unspecified: Secondary | ICD-10-CM | POA: Diagnosis not present

## 2020-09-23 DIAGNOSIS — E7800 Pure hypercholesterolemia, unspecified: Secondary | ICD-10-CM | POA: Diagnosis not present

## 2020-10-03 DIAGNOSIS — R9431 Abnormal electrocardiogram [ECG] [EKG]: Secondary | ICD-10-CM | POA: Diagnosis not present

## 2020-10-03 DIAGNOSIS — Z7982 Long term (current) use of aspirin: Secondary | ICD-10-CM | POA: Diagnosis not present

## 2020-10-03 DIAGNOSIS — Z79899 Other long term (current) drug therapy: Secondary | ICD-10-CM | POA: Diagnosis not present

## 2020-10-03 DIAGNOSIS — R918 Other nonspecific abnormal finding of lung field: Secondary | ICD-10-CM | POA: Diagnosis not present

## 2020-10-03 DIAGNOSIS — I959 Hypotension, unspecified: Secondary | ICD-10-CM | POA: Diagnosis not present

## 2020-10-03 DIAGNOSIS — Z931 Gastrostomy status: Secondary | ICD-10-CM | POA: Diagnosis not present

## 2020-10-03 DIAGNOSIS — E860 Dehydration: Secondary | ICD-10-CM | POA: Diagnosis not present

## 2020-10-03 DIAGNOSIS — R531 Weakness: Secondary | ICD-10-CM | POA: Diagnosis not present

## 2020-10-03 DIAGNOSIS — Z87891 Personal history of nicotine dependence: Secondary | ICD-10-CM | POA: Diagnosis not present

## 2020-10-03 DIAGNOSIS — M6281 Muscle weakness (generalized): Secondary | ICD-10-CM | POA: Diagnosis not present

## 2020-10-03 DIAGNOSIS — R55 Syncope and collapse: Secondary | ICD-10-CM | POA: Diagnosis not present

## 2020-10-04 DIAGNOSIS — C158 Malignant neoplasm of overlapping sites of esophagus: Secondary | ICD-10-CM | POA: Diagnosis not present

## 2020-10-04 DIAGNOSIS — C155 Malignant neoplasm of lower third of esophagus: Secondary | ICD-10-CM | POA: Diagnosis not present

## 2020-10-06 DIAGNOSIS — T451X5A Adverse effect of antineoplastic and immunosuppressive drugs, initial encounter: Secondary | ICD-10-CM | POA: Diagnosis not present

## 2020-10-06 DIAGNOSIS — G620 Drug-induced polyneuropathy: Secondary | ICD-10-CM | POA: Diagnosis not present

## 2020-10-06 DIAGNOSIS — C155 Malignant neoplasm of lower third of esophagus: Secondary | ICD-10-CM | POA: Diagnosis not present

## 2020-10-06 DIAGNOSIS — Z7901 Long term (current) use of anticoagulants: Secondary | ICD-10-CM | POA: Diagnosis not present

## 2020-10-06 DIAGNOSIS — G909 Disorder of the autonomic nervous system, unspecified: Secondary | ICD-10-CM | POA: Diagnosis not present

## 2020-10-06 DIAGNOSIS — Z09 Encounter for follow-up examination after completed treatment for conditions other than malignant neoplasm: Secondary | ICD-10-CM | POA: Diagnosis not present

## 2020-10-06 DIAGNOSIS — Z08 Encounter for follow-up examination after completed treatment for malignant neoplasm: Secondary | ICD-10-CM | POA: Diagnosis not present

## 2020-10-06 DIAGNOSIS — Z95828 Presence of other vascular implants and grafts: Secondary | ICD-10-CM | POA: Diagnosis not present

## 2020-10-06 DIAGNOSIS — Z7189 Other specified counseling: Secondary | ICD-10-CM | POA: Diagnosis not present

## 2020-10-06 DIAGNOSIS — Z931 Gastrostomy status: Secondary | ICD-10-CM | POA: Diagnosis not present

## 2020-10-07 DIAGNOSIS — Z95828 Presence of other vascular implants and grafts: Secondary | ICD-10-CM | POA: Diagnosis not present

## 2020-10-07 DIAGNOSIS — C155 Malignant neoplasm of lower third of esophagus: Secondary | ICD-10-CM | POA: Diagnosis not present

## 2020-10-08 DIAGNOSIS — C155 Malignant neoplasm of lower third of esophagus: Secondary | ICD-10-CM | POA: Diagnosis not present

## 2020-10-08 DIAGNOSIS — Z09 Encounter for follow-up examination after completed treatment for conditions other than malignant neoplasm: Secondary | ICD-10-CM | POA: Diagnosis not present

## 2020-10-09 DIAGNOSIS — C155 Malignant neoplasm of lower third of esophagus: Secondary | ICD-10-CM | POA: Diagnosis not present

## 2020-10-09 DIAGNOSIS — Z95828 Presence of other vascular implants and grafts: Secondary | ICD-10-CM | POA: Diagnosis not present

## 2020-10-10 DIAGNOSIS — Z452 Encounter for adjustment and management of vascular access device: Secondary | ICD-10-CM | POA: Diagnosis not present

## 2020-10-10 DIAGNOSIS — C155 Malignant neoplasm of lower third of esophagus: Secondary | ICD-10-CM | POA: Diagnosis not present

## 2020-10-15 DIAGNOSIS — C155 Malignant neoplasm of lower third of esophagus: Secondary | ICD-10-CM | POA: Diagnosis not present

## 2020-10-15 DIAGNOSIS — Z95828 Presence of other vascular implants and grafts: Secondary | ICD-10-CM | POA: Diagnosis not present

## 2020-10-16 DIAGNOSIS — C155 Malignant neoplasm of lower third of esophagus: Secondary | ICD-10-CM | POA: Diagnosis not present

## 2020-10-16 DIAGNOSIS — Z95828 Presence of other vascular implants and grafts: Secondary | ICD-10-CM | POA: Diagnosis not present

## 2020-10-17 DIAGNOSIS — C155 Malignant neoplasm of lower third of esophagus: Secondary | ICD-10-CM | POA: Diagnosis not present

## 2020-10-20 DIAGNOSIS — M109 Gout, unspecified: Secondary | ICD-10-CM | POA: Diagnosis not present

## 2020-10-20 DIAGNOSIS — G620 Drug-induced polyneuropathy: Secondary | ICD-10-CM | POA: Diagnosis not present

## 2020-10-20 DIAGNOSIS — T451X5A Adverse effect of antineoplastic and immunosuppressive drugs, initial encounter: Secondary | ICD-10-CM | POA: Diagnosis not present

## 2020-10-20 DIAGNOSIS — K219 Gastro-esophageal reflux disease without esophagitis: Secondary | ICD-10-CM | POA: Diagnosis not present

## 2020-10-20 DIAGNOSIS — G909 Disorder of the autonomic nervous system, unspecified: Secondary | ICD-10-CM | POA: Diagnosis not present

## 2020-10-20 DIAGNOSIS — Z09 Encounter for follow-up examination after completed treatment for conditions other than malignant neoplasm: Secondary | ICD-10-CM | POA: Diagnosis not present

## 2020-10-20 DIAGNOSIS — E43 Unspecified severe protein-calorie malnutrition: Secondary | ICD-10-CM | POA: Diagnosis not present

## 2020-10-20 DIAGNOSIS — R609 Edema, unspecified: Secondary | ICD-10-CM | POA: Diagnosis not present

## 2020-10-20 DIAGNOSIS — Z951 Presence of aortocoronary bypass graft: Secondary | ICD-10-CM | POA: Diagnosis not present

## 2020-10-20 DIAGNOSIS — I82C21 Chronic embolism and thrombosis of right internal jugular vein: Secondary | ICD-10-CM | POA: Diagnosis not present

## 2020-10-20 DIAGNOSIS — Z08 Encounter for follow-up examination after completed treatment for malignant neoplasm: Secondary | ICD-10-CM | POA: Diagnosis not present

## 2020-10-20 DIAGNOSIS — C155 Malignant neoplasm of lower third of esophagus: Secondary | ICD-10-CM | POA: Diagnosis not present

## 2020-10-20 DIAGNOSIS — Z931 Gastrostomy status: Secondary | ICD-10-CM | POA: Diagnosis not present

## 2020-10-20 DIAGNOSIS — I82890 Acute embolism and thrombosis of other specified veins: Secondary | ICD-10-CM | POA: Diagnosis not present

## 2020-10-20 DIAGNOSIS — G629 Polyneuropathy, unspecified: Secondary | ICD-10-CM | POA: Diagnosis not present

## 2020-10-20 DIAGNOSIS — C153 Malignant neoplasm of upper third of esophagus: Secondary | ICD-10-CM | POA: Diagnosis not present

## 2020-10-20 DIAGNOSIS — E785 Hyperlipidemia, unspecified: Secondary | ICD-10-CM | POA: Diagnosis not present

## 2020-10-20 DIAGNOSIS — I2510 Atherosclerotic heart disease of native coronary artery without angina pectoris: Secondary | ICD-10-CM | POA: Diagnosis not present

## 2020-10-21 DIAGNOSIS — R2689 Other abnormalities of gait and mobility: Secondary | ICD-10-CM | POA: Diagnosis not present

## 2020-10-21 DIAGNOSIS — Z9049 Acquired absence of other specified parts of digestive tract: Secondary | ICD-10-CM | POA: Diagnosis not present

## 2020-10-21 DIAGNOSIS — R29898 Other symptoms and signs involving the musculoskeletal system: Secondary | ICD-10-CM | POA: Diagnosis not present

## 2020-10-21 DIAGNOSIS — C155 Malignant neoplasm of lower third of esophagus: Secondary | ICD-10-CM | POA: Diagnosis not present

## 2020-10-21 DIAGNOSIS — R5383 Other fatigue: Secondary | ICD-10-CM | POA: Diagnosis not present

## 2020-10-22 DIAGNOSIS — Z09 Encounter for follow-up examination after completed treatment for conditions other than malignant neoplasm: Secondary | ICD-10-CM | POA: Diagnosis not present

## 2020-10-22 DIAGNOSIS — C155 Malignant neoplasm of lower third of esophagus: Secondary | ICD-10-CM | POA: Diagnosis not present

## 2020-10-24 DIAGNOSIS — C155 Malignant neoplasm of lower third of esophagus: Secondary | ICD-10-CM | POA: Diagnosis not present

## 2020-10-24 DIAGNOSIS — Z95828 Presence of other vascular implants and grafts: Secondary | ICD-10-CM | POA: Diagnosis not present

## 2020-11-04 DIAGNOSIS — Z09 Encounter for follow-up examination after completed treatment for conditions other than malignant neoplasm: Secondary | ICD-10-CM | POA: Diagnosis not present

## 2020-11-04 DIAGNOSIS — C158 Malignant neoplasm of overlapping sites of esophagus: Secondary | ICD-10-CM | POA: Diagnosis not present

## 2020-11-04 DIAGNOSIS — C155 Malignant neoplasm of lower third of esophagus: Secondary | ICD-10-CM | POA: Diagnosis not present

## 2020-11-05 DIAGNOSIS — C155 Malignant neoplasm of lower third of esophagus: Secondary | ICD-10-CM | POA: Diagnosis not present

## 2020-11-05 DIAGNOSIS — Z95828 Presence of other vascular implants and grafts: Secondary | ICD-10-CM | POA: Diagnosis not present

## 2020-11-12 DIAGNOSIS — R29898 Other symptoms and signs involving the musculoskeletal system: Secondary | ICD-10-CM | POA: Diagnosis not present

## 2020-11-12 DIAGNOSIS — R5383 Other fatigue: Secondary | ICD-10-CM | POA: Diagnosis not present

## 2020-11-12 DIAGNOSIS — C155 Malignant neoplasm of lower third of esophagus: Secondary | ICD-10-CM | POA: Diagnosis not present

## 2020-11-12 DIAGNOSIS — C158 Malignant neoplasm of overlapping sites of esophagus: Secondary | ICD-10-CM | POA: Diagnosis not present

## 2020-11-12 DIAGNOSIS — R2689 Other abnormalities of gait and mobility: Secondary | ICD-10-CM | POA: Diagnosis not present

## 2020-11-12 DIAGNOSIS — Z9049 Acquired absence of other specified parts of digestive tract: Secondary | ICD-10-CM | POA: Diagnosis not present

## 2020-11-18 DIAGNOSIS — K219 Gastro-esophageal reflux disease without esophagitis: Secondary | ICD-10-CM | POA: Diagnosis not present

## 2020-11-18 DIAGNOSIS — C155 Malignant neoplasm of lower third of esophagus: Secondary | ICD-10-CM | POA: Diagnosis not present

## 2020-11-18 DIAGNOSIS — Z79899 Other long term (current) drug therapy: Secondary | ICD-10-CM | POA: Diagnosis not present

## 2020-11-18 DIAGNOSIS — G629 Polyneuropathy, unspecified: Secondary | ICD-10-CM | POA: Diagnosis not present

## 2020-11-18 DIAGNOSIS — Z09 Encounter for follow-up examination after completed treatment for conditions other than malignant neoplasm: Secondary | ICD-10-CM | POA: Diagnosis not present

## 2020-11-18 DIAGNOSIS — E7800 Pure hypercholesterolemia, unspecified: Secondary | ICD-10-CM | POA: Diagnosis not present

## 2020-11-18 DIAGNOSIS — E860 Dehydration: Secondary | ICD-10-CM | POA: Diagnosis not present

## 2020-11-18 DIAGNOSIS — I2510 Atherosclerotic heart disease of native coronary artery without angina pectoris: Secondary | ICD-10-CM | POA: Diagnosis not present

## 2020-11-18 DIAGNOSIS — M109 Gout, unspecified: Secondary | ICD-10-CM | POA: Diagnosis not present

## 2020-11-18 DIAGNOSIS — G4730 Sleep apnea, unspecified: Secondary | ICD-10-CM | POA: Diagnosis not present

## 2020-11-19 DIAGNOSIS — C155 Malignant neoplasm of lower third of esophagus: Secondary | ICD-10-CM | POA: Diagnosis not present

## 2020-11-19 DIAGNOSIS — Z09 Encounter for follow-up examination after completed treatment for conditions other than malignant neoplasm: Secondary | ICD-10-CM | POA: Diagnosis not present

## 2020-11-21 DIAGNOSIS — C155 Malignant neoplasm of lower third of esophagus: Secondary | ICD-10-CM | POA: Diagnosis not present

## 2020-11-21 DIAGNOSIS — R2689 Other abnormalities of gait and mobility: Secondary | ICD-10-CM | POA: Diagnosis not present

## 2020-11-21 DIAGNOSIS — R531 Weakness: Secondary | ICD-10-CM | POA: Diagnosis not present

## 2020-11-21 DIAGNOSIS — R5383 Other fatigue: Secondary | ICD-10-CM | POA: Diagnosis not present

## 2020-11-21 DIAGNOSIS — R29898 Other symptoms and signs involving the musculoskeletal system: Secondary | ICD-10-CM | POA: Diagnosis not present

## 2020-11-21 DIAGNOSIS — Z7409 Other reduced mobility: Secondary | ICD-10-CM | POA: Diagnosis not present

## 2020-11-26 DIAGNOSIS — R531 Weakness: Secondary | ICD-10-CM | POA: Diagnosis not present

## 2020-11-26 DIAGNOSIS — R2689 Other abnormalities of gait and mobility: Secondary | ICD-10-CM | POA: Diagnosis not present

## 2020-11-26 DIAGNOSIS — C155 Malignant neoplasm of lower third of esophagus: Secondary | ICD-10-CM | POA: Diagnosis not present

## 2020-11-26 DIAGNOSIS — Z7409 Other reduced mobility: Secondary | ICD-10-CM | POA: Diagnosis not present

## 2020-11-26 DIAGNOSIS — R29898 Other symptoms and signs involving the musculoskeletal system: Secondary | ICD-10-CM | POA: Diagnosis not present

## 2020-11-26 DIAGNOSIS — R5383 Other fatigue: Secondary | ICD-10-CM | POA: Diagnosis not present

## 2020-12-01 DIAGNOSIS — Z961 Presence of intraocular lens: Secondary | ICD-10-CM | POA: Diagnosis not present

## 2020-12-01 DIAGNOSIS — H35371 Puckering of macula, right eye: Secondary | ICD-10-CM | POA: Diagnosis not present

## 2020-12-01 DIAGNOSIS — H40013 Open angle with borderline findings, low risk, bilateral: Secondary | ICD-10-CM | POA: Diagnosis not present

## 2020-12-02 DIAGNOSIS — C155 Malignant neoplasm of lower third of esophagus: Secondary | ICD-10-CM | POA: Diagnosis not present

## 2020-12-02 DIAGNOSIS — E860 Dehydration: Secondary | ICD-10-CM | POA: Diagnosis not present

## 2020-12-02 DIAGNOSIS — Z79899 Other long term (current) drug therapy: Secondary | ICD-10-CM | POA: Diagnosis not present

## 2020-12-02 DIAGNOSIS — I2510 Atherosclerotic heart disease of native coronary artery without angina pectoris: Secondary | ICD-10-CM | POA: Diagnosis not present

## 2020-12-02 DIAGNOSIS — M109 Gout, unspecified: Secondary | ICD-10-CM | POA: Diagnosis not present

## 2020-12-02 DIAGNOSIS — Z888 Allergy status to other drugs, medicaments and biological substances status: Secondary | ICD-10-CM | POA: Diagnosis not present

## 2020-12-02 DIAGNOSIS — Z09 Encounter for follow-up examination after completed treatment for conditions other than malignant neoplasm: Secondary | ICD-10-CM | POA: Diagnosis not present

## 2020-12-02 DIAGNOSIS — G629 Polyneuropathy, unspecified: Secondary | ICD-10-CM | POA: Diagnosis not present

## 2020-12-02 DIAGNOSIS — E7800 Pure hypercholesterolemia, unspecified: Secondary | ICD-10-CM | POA: Diagnosis not present

## 2020-12-02 DIAGNOSIS — C159 Malignant neoplasm of esophagus, unspecified: Secondary | ICD-10-CM | POA: Diagnosis not present

## 2020-12-02 DIAGNOSIS — Z951 Presence of aortocoronary bypass graft: Secondary | ICD-10-CM | POA: Diagnosis not present

## 2020-12-04 DIAGNOSIS — C158 Malignant neoplasm of overlapping sites of esophagus: Secondary | ICD-10-CM | POA: Diagnosis not present

## 2020-12-04 DIAGNOSIS — C155 Malignant neoplasm of lower third of esophagus: Secondary | ICD-10-CM | POA: Diagnosis not present

## 2020-12-12 DIAGNOSIS — R6881 Early satiety: Secondary | ICD-10-CM | POA: Diagnosis not present

## 2020-12-12 DIAGNOSIS — Z903 Acquired absence of stomach [part of]: Secondary | ICD-10-CM | POA: Diagnosis not present

## 2020-12-12 DIAGNOSIS — Z923 Personal history of irradiation: Secondary | ICD-10-CM | POA: Diagnosis not present

## 2020-12-12 DIAGNOSIS — Z09 Encounter for follow-up examination after completed treatment for conditions other than malignant neoplasm: Secondary | ICD-10-CM | POA: Diagnosis not present

## 2020-12-12 DIAGNOSIS — C155 Malignant neoplasm of lower third of esophagus: Secondary | ICD-10-CM | POA: Diagnosis not present

## 2020-12-12 DIAGNOSIS — R5383 Other fatigue: Secondary | ICD-10-CM | POA: Diagnosis not present

## 2020-12-12 DIAGNOSIS — C160 Malignant neoplasm of cardia: Secondary | ICD-10-CM | POA: Diagnosis not present

## 2020-12-12 DIAGNOSIS — Z9221 Personal history of antineoplastic chemotherapy: Secondary | ICD-10-CM | POA: Diagnosis not present

## 2020-12-12 DIAGNOSIS — Z934 Other artificial openings of gastrointestinal tract status: Secondary | ICD-10-CM | POA: Diagnosis not present

## 2020-12-12 DIAGNOSIS — Z79899 Other long term (current) drug therapy: Secondary | ICD-10-CM | POA: Diagnosis not present

## 2020-12-17 DIAGNOSIS — C155 Malignant neoplasm of lower third of esophagus: Secondary | ICD-10-CM | POA: Diagnosis not present

## 2020-12-18 DIAGNOSIS — C155 Malignant neoplasm of lower third of esophagus: Secondary | ICD-10-CM | POA: Diagnosis not present

## 2020-12-22 DIAGNOSIS — C155 Malignant neoplasm of lower third of esophagus: Secondary | ICD-10-CM | POA: Diagnosis not present

## 2020-12-24 DIAGNOSIS — L299 Pruritus, unspecified: Secondary | ICD-10-CM | POA: Diagnosis not present

## 2020-12-24 DIAGNOSIS — L308 Other specified dermatitis: Secondary | ICD-10-CM | POA: Diagnosis not present

## 2020-12-24 DIAGNOSIS — D485 Neoplasm of uncertain behavior of skin: Secondary | ICD-10-CM | POA: Diagnosis not present

## 2020-12-24 DIAGNOSIS — L309 Dermatitis, unspecified: Secondary | ICD-10-CM | POA: Diagnosis not present

## 2020-12-24 DIAGNOSIS — L280 Lichen simplex chronicus: Secondary | ICD-10-CM | POA: Diagnosis not present

## 2020-12-26 DIAGNOSIS — I5189 Other ill-defined heart diseases: Secondary | ICD-10-CM | POA: Diagnosis not present

## 2020-12-26 DIAGNOSIS — I517 Cardiomegaly: Secondary | ICD-10-CM | POA: Diagnosis not present

## 2020-12-26 DIAGNOSIS — Z79899 Other long term (current) drug therapy: Secondary | ICD-10-CM | POA: Diagnosis not present

## 2020-12-26 DIAGNOSIS — Z5181 Encounter for therapeutic drug level monitoring: Secondary | ICD-10-CM | POA: Diagnosis not present

## 2020-12-29 DIAGNOSIS — C155 Malignant neoplasm of lower third of esophagus: Secondary | ICD-10-CM | POA: Diagnosis not present

## 2020-12-30 DIAGNOSIS — C155 Malignant neoplasm of lower third of esophagus: Secondary | ICD-10-CM | POA: Diagnosis not present

## 2021-01-01 DIAGNOSIS — I2584 Coronary atherosclerosis due to calcified coronary lesion: Secondary | ICD-10-CM | POA: Diagnosis not present

## 2021-01-01 DIAGNOSIS — G4733 Obstructive sleep apnea (adult) (pediatric): Secondary | ICD-10-CM | POA: Diagnosis not present

## 2021-01-01 DIAGNOSIS — Z0001 Encounter for general adult medical examination with abnormal findings: Secondary | ICD-10-CM | POA: Diagnosis not present

## 2021-01-01 DIAGNOSIS — E7801 Familial hypercholesterolemia: Secondary | ICD-10-CM | POA: Diagnosis not present

## 2021-01-01 DIAGNOSIS — Z6832 Body mass index (BMI) 32.0-32.9, adult: Secondary | ICD-10-CM | POA: Diagnosis not present

## 2021-01-06 DIAGNOSIS — C155 Malignant neoplasm of lower third of esophagus: Secondary | ICD-10-CM | POA: Diagnosis not present

## 2021-01-08 DIAGNOSIS — L2082 Flexural eczema: Secondary | ICD-10-CM | POA: Diagnosis not present

## 2021-01-14 DIAGNOSIS — E669 Obesity, unspecified: Secondary | ICD-10-CM | POA: Diagnosis not present

## 2021-01-14 DIAGNOSIS — I10 Essential (primary) hypertension: Secondary | ICD-10-CM | POA: Diagnosis not present

## 2021-01-14 DIAGNOSIS — E785 Hyperlipidemia, unspecified: Secondary | ICD-10-CM | POA: Diagnosis not present

## 2021-01-14 DIAGNOSIS — C779 Secondary and unspecified malignant neoplasm of lymph node, unspecified: Secondary | ICD-10-CM | POA: Diagnosis not present

## 2021-01-14 DIAGNOSIS — G909 Disorder of the autonomic nervous system, unspecified: Secondary | ICD-10-CM | POA: Diagnosis not present

## 2021-01-14 DIAGNOSIS — G8929 Other chronic pain: Secondary | ICD-10-CM | POA: Diagnosis not present

## 2021-01-14 DIAGNOSIS — I2510 Atherosclerotic heart disease of native coronary artery without angina pectoris: Secondary | ICD-10-CM | POA: Diagnosis not present

## 2021-01-14 DIAGNOSIS — C159 Malignant neoplasm of esophagus, unspecified: Secondary | ICD-10-CM | POA: Diagnosis not present

## 2021-01-14 DIAGNOSIS — Z9981 Dependence on supplemental oxygen: Secondary | ICD-10-CM | POA: Diagnosis not present

## 2021-01-14 DIAGNOSIS — G4733 Obstructive sleep apnea (adult) (pediatric): Secondary | ICD-10-CM | POA: Diagnosis not present

## 2021-01-29 DIAGNOSIS — G620 Drug-induced polyneuropathy: Secondary | ICD-10-CM | POA: Diagnosis not present

## 2021-01-29 DIAGNOSIS — T451X5A Adverse effect of antineoplastic and immunosuppressive drugs, initial encounter: Secondary | ICD-10-CM | POA: Diagnosis not present

## 2021-01-29 DIAGNOSIS — C155 Malignant neoplasm of lower third of esophagus: Secondary | ICD-10-CM | POA: Diagnosis not present

## 2021-01-29 DIAGNOSIS — I82890 Acute embolism and thrombosis of other specified veins: Secondary | ICD-10-CM | POA: Diagnosis not present

## 2021-01-29 DIAGNOSIS — J0141 Acute recurrent pansinusitis: Secondary | ICD-10-CM | POA: Diagnosis not present

## 2021-01-29 DIAGNOSIS — Z08 Encounter for follow-up examination after completed treatment for malignant neoplasm: Secondary | ICD-10-CM | POA: Diagnosis not present

## 2021-02-11 DIAGNOSIS — C159 Malignant neoplasm of esophagus, unspecified: Secondary | ICD-10-CM | POA: Diagnosis not present

## 2021-02-11 DIAGNOSIS — Z5112 Encounter for antineoplastic immunotherapy: Secondary | ICD-10-CM | POA: Diagnosis not present

## 2021-02-11 DIAGNOSIS — Z09 Encounter for follow-up examination after completed treatment for conditions other than malignant neoplasm: Secondary | ICD-10-CM | POA: Diagnosis not present

## 2021-02-11 DIAGNOSIS — C155 Malignant neoplasm of lower third of esophagus: Secondary | ICD-10-CM | POA: Diagnosis not present

## 2021-02-11 DIAGNOSIS — Z95828 Presence of other vascular implants and grafts: Secondary | ICD-10-CM | POA: Diagnosis not present

## 2021-02-16 DIAGNOSIS — L309 Dermatitis, unspecified: Secondary | ICD-10-CM | POA: Diagnosis not present

## 2021-02-20 DIAGNOSIS — Z6832 Body mass index (BMI) 32.0-32.9, adult: Secondary | ICD-10-CM | POA: Diagnosis not present

## 2021-02-20 DIAGNOSIS — G4733 Obstructive sleep apnea (adult) (pediatric): Secondary | ICD-10-CM | POA: Diagnosis not present

## 2021-02-20 DIAGNOSIS — K219 Gastro-esophageal reflux disease without esophagitis: Secondary | ICD-10-CM | POA: Diagnosis not present

## 2021-02-20 DIAGNOSIS — Z0001 Encounter for general adult medical examination with abnormal findings: Secondary | ICD-10-CM | POA: Diagnosis not present

## 2021-02-20 DIAGNOSIS — R739 Hyperglycemia, unspecified: Secondary | ICD-10-CM | POA: Diagnosis not present

## 2021-02-20 DIAGNOSIS — C159 Malignant neoplasm of esophagus, unspecified: Secondary | ICD-10-CM | POA: Diagnosis not present

## 2021-02-20 DIAGNOSIS — I2584 Coronary atherosclerosis due to calcified coronary lesion: Secondary | ICD-10-CM | POA: Diagnosis not present

## 2021-02-20 DIAGNOSIS — M1000 Idiopathic gout, unspecified site: Secondary | ICD-10-CM | POA: Diagnosis not present

## 2021-03-02 DIAGNOSIS — K911 Postgastric surgery syndromes: Secondary | ICD-10-CM | POA: Diagnosis not present

## 2021-03-02 DIAGNOSIS — E785 Hyperlipidemia, unspecified: Secondary | ICD-10-CM | POA: Diagnosis not present

## 2021-03-02 DIAGNOSIS — I2510 Atherosclerotic heart disease of native coronary artery without angina pectoris: Secondary | ICD-10-CM | POA: Diagnosis not present

## 2021-03-02 DIAGNOSIS — E669 Obesity, unspecified: Secondary | ICD-10-CM | POA: Diagnosis not present

## 2021-03-02 DIAGNOSIS — G620 Drug-induced polyneuropathy: Secondary | ICD-10-CM | POA: Diagnosis not present

## 2021-03-02 DIAGNOSIS — K219 Gastro-esophageal reflux disease without esophagitis: Secondary | ICD-10-CM | POA: Diagnosis not present

## 2021-03-02 DIAGNOSIS — G629 Polyneuropathy, unspecified: Secondary | ICD-10-CM | POA: Diagnosis not present

## 2021-03-02 DIAGNOSIS — M109 Gout, unspecified: Secondary | ICD-10-CM | POA: Diagnosis not present

## 2021-03-02 DIAGNOSIS — Z23 Encounter for immunization: Secondary | ICD-10-CM | POA: Diagnosis not present

## 2021-03-02 DIAGNOSIS — Z08 Encounter for follow-up examination after completed treatment for malignant neoplasm: Secondary | ICD-10-CM | POA: Diagnosis not present

## 2021-03-02 DIAGNOSIS — T451X5A Adverse effect of antineoplastic and immunosuppressive drugs, initial encounter: Secondary | ICD-10-CM | POA: Diagnosis not present

## 2021-03-02 DIAGNOSIS — Z7901 Long term (current) use of anticoagulants: Secondary | ICD-10-CM | POA: Diagnosis not present

## 2021-03-02 DIAGNOSIS — Z09 Encounter for follow-up examination after completed treatment for conditions other than malignant neoplasm: Secondary | ICD-10-CM | POA: Diagnosis not present

## 2021-03-02 DIAGNOSIS — C159 Malignant neoplasm of esophagus, unspecified: Secondary | ICD-10-CM | POA: Diagnosis not present

## 2021-03-02 DIAGNOSIS — C155 Malignant neoplasm of lower third of esophagus: Secondary | ICD-10-CM | POA: Diagnosis not present

## 2021-03-02 DIAGNOSIS — C7800 Secondary malignant neoplasm of unspecified lung: Secondary | ICD-10-CM | POA: Diagnosis not present

## 2021-03-04 DIAGNOSIS — Z5112 Encounter for antineoplastic immunotherapy: Secondary | ICD-10-CM | POA: Diagnosis not present

## 2021-03-04 DIAGNOSIS — C155 Malignant neoplasm of lower third of esophagus: Secondary | ICD-10-CM | POA: Diagnosis not present

## 2021-03-04 DIAGNOSIS — Z95828 Presence of other vascular implants and grafts: Secondary | ICD-10-CM | POA: Diagnosis not present

## 2021-03-10 DIAGNOSIS — G4733 Obstructive sleep apnea (adult) (pediatric): Secondary | ICD-10-CM | POA: Diagnosis not present

## 2021-03-20 DIAGNOSIS — J479 Bronchiectasis, uncomplicated: Secondary | ICD-10-CM | POA: Diagnosis not present

## 2021-03-20 DIAGNOSIS — R590 Localized enlarged lymph nodes: Secondary | ICD-10-CM | POA: Diagnosis not present

## 2021-03-20 DIAGNOSIS — K668 Other specified disorders of peritoneum: Secondary | ICD-10-CM | POA: Diagnosis not present

## 2021-03-20 DIAGNOSIS — Z903 Acquired absence of stomach [part of]: Secondary | ICD-10-CM | POA: Diagnosis not present

## 2021-03-20 DIAGNOSIS — C155 Malignant neoplasm of lower third of esophagus: Secondary | ICD-10-CM | POA: Diagnosis not present

## 2021-03-20 DIAGNOSIS — R9389 Abnormal findings on diagnostic imaging of other specified body structures: Secondary | ICD-10-CM | POA: Diagnosis not present

## 2021-03-23 DIAGNOSIS — C155 Malignant neoplasm of lower third of esophagus: Secondary | ICD-10-CM | POA: Diagnosis not present

## 2021-03-23 DIAGNOSIS — Z09 Encounter for follow-up examination after completed treatment for conditions other than malignant neoplasm: Secondary | ICD-10-CM | POA: Diagnosis not present

## 2021-03-23 DIAGNOSIS — C159 Malignant neoplasm of esophagus, unspecified: Secondary | ICD-10-CM | POA: Diagnosis not present

## 2021-03-24 DIAGNOSIS — Z5112 Encounter for antineoplastic immunotherapy: Secondary | ICD-10-CM | POA: Diagnosis not present

## 2021-03-24 DIAGNOSIS — C155 Malignant neoplasm of lower third of esophagus: Secondary | ICD-10-CM | POA: Diagnosis not present

## 2021-04-02 DIAGNOSIS — I517 Cardiomegaly: Secondary | ICD-10-CM | POA: Diagnosis not present

## 2021-04-02 DIAGNOSIS — I2510 Atherosclerotic heart disease of native coronary artery without angina pectoris: Secondary | ICD-10-CM | POA: Diagnosis not present

## 2021-04-02 DIAGNOSIS — I340 Nonrheumatic mitral (valve) insufficiency: Secondary | ICD-10-CM | POA: Diagnosis not present

## 2021-04-13 DIAGNOSIS — Z09 Encounter for follow-up examination after completed treatment for conditions other than malignant neoplasm: Secondary | ICD-10-CM | POA: Diagnosis not present

## 2021-04-13 DIAGNOSIS — C155 Malignant neoplasm of lower third of esophagus: Secondary | ICD-10-CM | POA: Diagnosis not present

## 2021-04-14 DIAGNOSIS — Z95828 Presence of other vascular implants and grafts: Secondary | ICD-10-CM | POA: Diagnosis not present

## 2021-04-14 DIAGNOSIS — C155 Malignant neoplasm of lower third of esophagus: Secondary | ICD-10-CM | POA: Diagnosis not present

## 2021-04-25 DIAGNOSIS — J329 Chronic sinusitis, unspecified: Secondary | ICD-10-CM | POA: Diagnosis not present

## 2021-04-25 DIAGNOSIS — J40 Bronchitis, not specified as acute or chronic: Secondary | ICD-10-CM | POA: Diagnosis not present

## 2021-05-04 DIAGNOSIS — C155 Malignant neoplasm of lower third of esophagus: Secondary | ICD-10-CM | POA: Diagnosis not present

## 2021-05-04 DIAGNOSIS — C153 Malignant neoplasm of upper third of esophagus: Secondary | ICD-10-CM | POA: Diagnosis not present

## 2021-05-04 DIAGNOSIS — Z95828 Presence of other vascular implants and grafts: Secondary | ICD-10-CM | POA: Diagnosis not present

## 2021-05-04 DIAGNOSIS — Z09 Encounter for follow-up examination after completed treatment for conditions other than malignant neoplasm: Secondary | ICD-10-CM | POA: Diagnosis not present

## 2021-05-05 DIAGNOSIS — Z5112 Encounter for antineoplastic immunotherapy: Secondary | ICD-10-CM | POA: Diagnosis not present

## 2021-05-05 DIAGNOSIS — Z95828 Presence of other vascular implants and grafts: Secondary | ICD-10-CM | POA: Diagnosis not present

## 2021-05-05 DIAGNOSIS — C155 Malignant neoplasm of lower third of esophagus: Secondary | ICD-10-CM | POA: Diagnosis not present

## 2021-05-13 DIAGNOSIS — L309 Dermatitis, unspecified: Secondary | ICD-10-CM | POA: Diagnosis not present

## 2021-05-13 DIAGNOSIS — Z1283 Encounter for screening for malignant neoplasm of skin: Secondary | ICD-10-CM | POA: Diagnosis not present

## 2021-05-13 DIAGNOSIS — L111 Transient acantholytic dermatosis [Grover]: Secondary | ICD-10-CM | POA: Diagnosis not present

## 2021-05-13 DIAGNOSIS — L570 Actinic keratosis: Secondary | ICD-10-CM | POA: Diagnosis not present

## 2021-05-26 DIAGNOSIS — C155 Malignant neoplasm of lower third of esophagus: Secondary | ICD-10-CM | POA: Diagnosis not present

## 2021-05-26 DIAGNOSIS — Z09 Encounter for follow-up examination after completed treatment for conditions other than malignant neoplasm: Secondary | ICD-10-CM | POA: Diagnosis not present

## 2021-05-26 DIAGNOSIS — C159 Malignant neoplasm of esophagus, unspecified: Secondary | ICD-10-CM | POA: Diagnosis not present

## 2021-05-27 DIAGNOSIS — Z5112 Encounter for antineoplastic immunotherapy: Secondary | ICD-10-CM | POA: Diagnosis not present

## 2021-05-27 DIAGNOSIS — C155 Malignant neoplasm of lower third of esophagus: Secondary | ICD-10-CM | POA: Diagnosis not present

## 2021-06-12 DIAGNOSIS — C155 Malignant neoplasm of lower third of esophagus: Secondary | ICD-10-CM | POA: Diagnosis not present

## 2021-06-12 DIAGNOSIS — Z09 Encounter for follow-up examination after completed treatment for conditions other than malignant neoplasm: Secondary | ICD-10-CM | POA: Diagnosis not present

## 2021-06-16 DIAGNOSIS — I2510 Atherosclerotic heart disease of native coronary artery without angina pectoris: Secondary | ICD-10-CM | POA: Diagnosis not present

## 2021-06-16 DIAGNOSIS — Z7901 Long term (current) use of anticoagulants: Secondary | ICD-10-CM | POA: Diagnosis not present

## 2021-06-16 DIAGNOSIS — C155 Malignant neoplasm of lower third of esophagus: Secondary | ICD-10-CM | POA: Diagnosis not present

## 2021-06-16 DIAGNOSIS — E7800 Pure hypercholesterolemia, unspecified: Secondary | ICD-10-CM | POA: Diagnosis not present

## 2021-06-16 DIAGNOSIS — R079 Chest pain, unspecified: Secondary | ICD-10-CM | POA: Diagnosis not present

## 2021-06-16 DIAGNOSIS — Z7982 Long term (current) use of aspirin: Secondary | ICD-10-CM | POA: Diagnosis not present

## 2021-06-16 DIAGNOSIS — M109 Gout, unspecified: Secondary | ICD-10-CM | POA: Diagnosis not present

## 2021-06-16 DIAGNOSIS — Z5112 Encounter for antineoplastic immunotherapy: Secondary | ICD-10-CM | POA: Diagnosis not present

## 2021-06-16 DIAGNOSIS — Z923 Personal history of irradiation: Secondary | ICD-10-CM | POA: Diagnosis not present

## 2021-06-17 ENCOUNTER — Ambulatory Visit: Payer: Medicare PPO | Admitting: Cardiology

## 2021-06-17 ENCOUNTER — Other Ambulatory Visit: Payer: Self-pay

## 2021-06-17 ENCOUNTER — Encounter: Payer: Self-pay | Admitting: Cardiology

## 2021-06-17 VITALS — BP 90/58 | HR 77 | Ht 69.0 in | Wt 213.8 lb

## 2021-06-17 DIAGNOSIS — E782 Mixed hyperlipidemia: Secondary | ICD-10-CM | POA: Diagnosis not present

## 2021-06-17 DIAGNOSIS — I25118 Atherosclerotic heart disease of native coronary artery with other forms of angina pectoris: Secondary | ICD-10-CM | POA: Diagnosis not present

## 2021-06-17 NOTE — Patient Instructions (Signed)
Medication Instructions:  Continue all current medications.   Labwork: none  Testing/Procedures: none  Follow-Up: 6 months   Any Other Special Instructions Will Be Listed Below (If Applicable).   If you need a refill on your cardiac medications before your next appointment, please call your pharmacy.  

## 2021-06-17 NOTE — Progress Notes (Signed)
Clinical Summary [name] is a 71 y.o.male seen today for follow up of the following medical problems.    1. CAD   - prior CABG 4 vessel 01/2010 at [facility], normal LVEF at that time.   - exercise MPI 02/2012: 10 min, 13 METs, Duke Treadmill score 10 low risk, no perfusion defects.   - 04/2016 nuclear stress: no ischemia - chronic left sided MSK pain ever since CABG      - 01/2019 echo [facility] LVEF 55-60%, mild LVH, aortic sclerosis.  - 05/2020 LVEF 55-60% - 03/2021 Echo [facility] LVEF 55-60%, mild MR     - walking up hill gets some some DOE, mild tightness in chest, left jaw and shoudler. Intermittent, occurs about 3 times a month. - this is highest level of activity since his surgery       2. Hyperlipidemia   - has tried multiple different statins. Has worked with Dr Adrian Gibson to find a regimen.  - tolerating crestor 20mg  daily.  08/2016 TC 192 TG 132 HDL 42 LDL 124 - he is compliant with statin     3. 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 History of DVT - compliant with eliquis -= developed around port a cath. - followed by heme/onc   Past Medical History:  Diagnosis Date   CAD (coronary artery disease)    multivessel/ left main CAD.Marland Kitchen Four-vessel CABG 8/11... Normal left ventricular funciont   Carpal tunnel syndrome    Dyslipidemia    Fusion of spine    Cervical vertebra fusion C5 C6 C7   GERD (gastroesophageal reflux disease)    Nasal polyps 2000   with rhinoplasty   Sleep apnea      Allergies  Allergen Reactions   Prochlorperazine Other (See Comments)    Neck muscles drew up/lip muscles drew up   Prochlorperazine Edisylate    Atorvastatin Other (See Comments)    myalgia     Current Outpatient Medications  Medication Sig  Dispense Refill   allopurinol (ZYLOPRIM) 100 MG tablet Take 100 mg by mouth at bedtime.      apixaban (ELIQUIS) 5 MG TABS tablet Take 5 mg by mouth 2 (two) times daily.     Carboxymethylcellulose Sodium 0.25 % SOLN Apply 1 drop to eye 2 (two) times daily as needed (dry/irritated eyes.).     Colchicine 0.6 MG CAPS Take 0.6 mg by mouth 2 (two) times daily as needed (gout flare ups).      famotidine (PEPCID) 20 MG tablet Take 20 mg by mouth daily as needed for heartburn or indigestion.     fluticasone (FLONASE) 50 MCG/ACT nasal spray Place 2 sprays into the nose daily as needed (congestion).     Multiple Vitamin (MULTIVITAMIN WITH MINERALS) TABS tablet Take 1 tablet by mouth daily.     nitroGLYCERIN (NITROSTAT) 0.4 MG SL tablet TAKE 1 TABLET BY MOUTH UNDER TONGUE EVERY 5 MINUTES AS NEEDED (Patient taking differently: Place 0.4 mg under the tongue every 5 (five) minutes as needed for chest pain.) 25 tablet 3   pantoprazole (PROTONIX) 40 MG tablet Take 1 tablet (40 mg total) by mouth daily. 30 minutes before breakfast 90 tablet 3   polyethylene glycol (MIRALAX / GLYCOLAX) 17 g packet Take 17 g by  mouth daily as needed.     pregabalin (LYRICA) 25 MG capsule Take 25 mg by mouth 3 (three) times daily.     rosuvastatin (CRESTOR) 20 MG tablet Take 20 mg by mouth every evening.      No current facility-administered medications for this visit.     Past Surgical History:  Procedure Laterality Date   BIOPSY  09/19/2018   Procedure: BIOPSY;  Surgeon: Daneil Dolin, MD;  [facility];  Service: Endoscopy;;  esophageal   CARPAL TUNNEL RELEASE     left   CATARACT EXTRACTION     Bilateral   CERVICAL LAMINECTOMY     and diskectomy   CORONARY ARTERY BYPASS GRAFT     X 4   ESOPHAGOGASTRODUODENOSCOPY N/A 09/19/2018   Procedure: ESOPHAGOGASTRODUODENOSCOPY (EGD);  Surgeon: Daneil Dolin, MD;  [facility];  Service: Endoscopy;  Laterality: N/A;  8:45am - office spoke with patient    RHINOPLASTY     Nasal   Ulnar Nerve Release from Left       Allergies  Allergen Reactions   Prochlorperazine Other (See Comments)    Neck muscles drew up/lip muscles drew up   Prochlorperazine Edisylate    Atorvastatin Other (See Comments)    myalgia      Family History  Problem Relation Age of Onset   Heart attack Father 68   Heart attack Brother 45       Heavy smoker   Colon cancer Neg Hx    Colon polyps Neg Hx      Social History [name] reports that he quit smoking about 36 years ago. His smoking use included cigars. He started smoking about 49 years ago. He has a 3.75 pack-year smoking history. He has quit using smokeless tobacco.  His smokeless tobacco use included chew. [name] reports that he does not currently use alcohol.   Review of Systems CONSTITUTIONAL: No weight loss, fever, chills, weakness or fatigue.  HEENT: Eyes: No visual loss, blurred vision, double vision or yellow sclerae.No hearing loss, sneezing, congestion, runny nose or sore throat.  SKIN: No rash or itching.  CARDIOVASCULAR: per hpi RESPIRATORY: No shortness of breath, cough or sputum.  GASTROINTESTINAL: No anorexia, nausea, vomiting or diarrhea. No abdominal pain or blood.  GENITOURINARY: No burning on urination, no polyuria NEUROLOGICAL: No headache, dizziness, syncope, paralysis, ataxia, numbness or tingling in the extremities. No change in bowel or bladder control.  MUSCULOSKELETAL: No muscle, back pain, joint pain or stiffness.  LYMPHATICS: No enlarged nodes. No history of splenectomy.  PSYCHIATRIC: No history of depression or anxiety.  ENDOCRINOLOGIC: No reports of sweating, cold or heat intolerance. No polyuria or polydipsia.  Marland Kitchen   Physical Examination Today's Vitals   06/17/21 0840  BP: (!) 90/58  Pulse: 77  SpO2: 99%  Weight: 213 lb 12.8 oz (97 kg)  Height: 5\' 9"  (1.753 m)   Body mass index is 31.57 kg/m.  Gen: resting comfortably, no acute distress HEENT: no  scleral icterus, pupils equal round and reactive, no palptable cervical adenopathy,  CV: RRR, no m/r/g no jvd Resp: Clear to auscultation bilaterally GI: abdomen is soft, non-tender, non-distended, normal bowel sounds, no hepatosplenomegaly MSK: extremities are warm, no edema.  Skin: warm, no rash Neuro:  no focal deficits Psych: appropriate affect   Diagnostic Studies  Exercise MPI 02/2012: 10 min, 13 METs, Duke Treadmill score 10 low risk, no perfusion defects   04/2016 Nuclear stress Blood pressure demonstrated a normal response to  exercise. There was no ST segment deviation noted during stress. No T wave inversion was noted during stress. The study is normal. This is a low risk study. The left ventricular ejection fraction is hyperdynamic (>65%).   Normal resting and stress perfusion. No ischemia or infarction EF 67%    03/2017 AAA screen Final Interpretation: Abdominal Aorta: No evidence of an abdominal aortic aneurysm was visualized. The largest aortic measurement is 2.5 cm. IVC is patent.  03/2021 echo Exam Date:     04/02/2021 9:10 AM Site Location:     [place] Exam Location:     [place] Admit Date:     04/02/2021  Exam Type:     ECHOCARDIOGRAM FOLLOW UP/LIMITED ECHO  Study Info Indications     - Chest Pain  Complete two-dimensional, color flow and Doppler transthoracic echocardiogram is performed.  Staff Referring Physician:     Silvestre Mesi ; Sonographer:     Cardell Peach Ordering Physician:     Silvestre Mesi  Account #:     [id number]   Summary  1. The left ventricle is normal in size with normal wall thickness.  2. The left ventricular systolic function is normal, LVEF is visually estimated at 55-60%.  3. There is mild mitral valve regurgitation.  4. The left atrium is mildly dilated in size.  5. The right ventricle is normal in size, with normal systolic function.  6. The right atrium is mildly dilated  in  size.      Assessment and Plan  1.  CAD with chronic stable angina -Off ASA since on eliquis for prior DVT - no acute ischemic changes on EKG - mild chest discomfort with high levels of exertion only that is stable. Soft bp's, we discussed starting ranexa but not interested at this time. If progression of symptoms would consider ischemic testing.    2. Hyperlipidemia   - followed closely by the patient's PCP Dr. Nadara Gibson, has tried mulitple statin regimens but has been limited due to myalgias currently on crestor.   -he will continue crestor.    3. DVT - followed by oncllogy, upper extremity related to his port - on eliquis per heme/onc, defer duration to them          Arnoldo Lenis, M.D.

## 2021-06-18 ENCOUNTER — Encounter: Payer: Self-pay | Admitting: *Deleted

## 2021-06-18 DIAGNOSIS — K219 Gastro-esophageal reflux disease without esophagitis: Secondary | ICD-10-CM | POA: Diagnosis not present

## 2021-06-18 DIAGNOSIS — C159 Malignant neoplasm of esophagus, unspecified: Secondary | ICD-10-CM | POA: Diagnosis not present

## 2021-06-18 DIAGNOSIS — L309 Dermatitis, unspecified: Secondary | ICD-10-CM | POA: Diagnosis not present

## 2021-06-18 DIAGNOSIS — G909 Disorder of the autonomic nervous system, unspecified: Secondary | ICD-10-CM | POA: Diagnosis not present

## 2021-06-18 DIAGNOSIS — I2510 Atherosclerotic heart disease of native coronary artery without angina pectoris: Secondary | ICD-10-CM | POA: Diagnosis not present

## 2021-06-18 DIAGNOSIS — G4733 Obstructive sleep apnea (adult) (pediatric): Secondary | ICD-10-CM | POA: Diagnosis not present

## 2021-06-18 DIAGNOSIS — G620 Drug-induced polyneuropathy: Secondary | ICD-10-CM | POA: Diagnosis not present

## 2021-06-18 DIAGNOSIS — E669 Obesity, unspecified: Secondary | ICD-10-CM | POA: Diagnosis not present

## 2021-06-18 DIAGNOSIS — E785 Hyperlipidemia, unspecified: Secondary | ICD-10-CM | POA: Diagnosis not present

## 2021-06-23 DIAGNOSIS — I340 Nonrheumatic mitral (valve) insufficiency: Secondary | ICD-10-CM | POA: Diagnosis not present

## 2021-06-23 DIAGNOSIS — I071 Rheumatic tricuspid insufficiency: Secondary | ICD-10-CM | POA: Diagnosis not present

## 2021-06-23 DIAGNOSIS — I2510 Atherosclerotic heart disease of native coronary artery without angina pectoris: Secondary | ICD-10-CM | POA: Diagnosis not present

## 2021-07-06 DIAGNOSIS — C159 Malignant neoplasm of esophagus, unspecified: Secondary | ICD-10-CM | POA: Diagnosis not present

## 2021-07-06 DIAGNOSIS — Z09 Encounter for follow-up examination after completed treatment for conditions other than malignant neoplasm: Secondary | ICD-10-CM | POA: Diagnosis not present

## 2021-07-06 DIAGNOSIS — C155 Malignant neoplasm of lower third of esophagus: Secondary | ICD-10-CM | POA: Diagnosis not present

## 2021-07-07 DIAGNOSIS — C155 Malignant neoplasm of lower third of esophagus: Secondary | ICD-10-CM | POA: Diagnosis not present

## 2021-07-07 DIAGNOSIS — Z5112 Encounter for antineoplastic immunotherapy: Secondary | ICD-10-CM | POA: Diagnosis not present

## 2021-07-17 DIAGNOSIS — Z6832 Body mass index (BMI) 32.0-32.9, adult: Secondary | ICD-10-CM | POA: Diagnosis not present

## 2021-07-17 DIAGNOSIS — J069 Acute upper respiratory infection, unspecified: Secondary | ICD-10-CM | POA: Diagnosis not present

## 2021-07-24 ENCOUNTER — Telehealth: Payer: Self-pay | Admitting: Cardiology

## 2021-07-24 DIAGNOSIS — C155 Malignant neoplasm of lower third of esophagus: Secondary | ICD-10-CM | POA: Diagnosis not present

## 2021-07-24 DIAGNOSIS — I2510 Atherosclerotic heart disease of native coronary artery without angina pectoris: Secondary | ICD-10-CM | POA: Diagnosis not present

## 2021-07-24 DIAGNOSIS — G909 Disorder of the autonomic nervous system, unspecified: Secondary | ICD-10-CM | POA: Diagnosis not present

## 2021-07-24 DIAGNOSIS — C159 Malignant neoplasm of esophagus, unspecified: Secondary | ICD-10-CM | POA: Diagnosis not present

## 2021-07-24 DIAGNOSIS — Z09 Encounter for follow-up examination after completed treatment for conditions other than malignant neoplasm: Secondary | ICD-10-CM | POA: Diagnosis not present

## 2021-07-24 NOTE — Telephone Encounter (Signed)
°  Patient Consent for Virtual Visit        Adrian Gibson has provided verbal consent on 07/24/2021 for a virtual visit (video or telephone).   CONSENT FOR VIRTUAL VISIT FOR:  Adrian Gibson  By participating in this virtual visit I agree to the following:  I hereby voluntarily request, consent and authorize [facility] and its employed or contracted physicians, physician assistants, nurse practitioners or other licensed health care professionals (the Practitioner), to provide me with telemedicine health care services (the Services") as deemed necessary by the treating Practitioner. I acknowledge and consent to receive the Services by the Practitioner via telemedicine. I understand that the telemedicine visit will involve communicating with the Practitioner through live audiovisual communication technology and the disclosure of certain medical information by electronic transmission. I acknowledge that I have been given the opportunity to request an in-person assessment or other available alternative prior to the telemedicine visit and am voluntarily participating in the telemedicine visit.  I understand that I have the right to withhold or withdraw my consent to the use of telemedicine in the course of my care at any time, without affecting my right to future care or treatment, and that the Practitioner or I may terminate the telemedicine visit at any time. I understand that I have the right to inspect all information obtained and/or recorded in the course of the telemedicine visit and may receive copies of available information for a reasonable fee.  I understand that some of the potential risks of receiving the Services via telemedicine include:  Delay or interruption in medical evaluation due to technological equipment failure or disruption; Information transmitted may not be sufficient (e.g. poor resolution of images) to allow for appropriate medical decision making by the Practitioner;  and/or  In rare instances, security protocols could fail, causing a breach of personal health information.  Furthermore, I acknowledge that it is my responsibility to provide information about my medical history, conditions and care that is complete and accurate to the best of my ability. I acknowledge that Practitioner's advice, recommendations, and/or decision may be based on factors not within their control, such as incomplete or inaccurate data provided by me or distortions of diagnostic images or specimens that may result from electronic transmissions. I understand that the practice of medicine is not an exact science and that Practitioner makes no warranties or guarantees regarding treatment outcomes. I acknowledge that a copy of this consent can be made available to me via my patient portal ([facility]), or I can request a printed copy by calling the office of [facility].    I understand that my insurance will be billed for this visit.   I have read or had this consent read to me. I understand the contents of this consent, which adequately explains the benefits and risks of the Services being provided via telemedicine.  I have been provided ample opportunity to ask questions regarding this consent and the Services and have had my questions answered to my satisfaction. I give my informed consent for the services to be provided through the use of telemedicine in my medical care

## 2021-07-24 NOTE — Telephone Encounter (Signed)
Spoke with patient/s oncologist. In clinic noted to have some worsening exertional chest pain, low bp's. Oncology ordering MUGA scan to assess EF given her chemo can have some cardiotoxicity, we will add patient on next week for clinic visit to discuss symptoms, may warrant ischemic testing.   Zandra Abts MD

## 2021-07-25 DIAGNOSIS — Z20828 Contact with and (suspected) exposure to other viral communicable diseases: Secondary | ICD-10-CM | POA: Diagnosis not present

## 2021-07-25 DIAGNOSIS — Z6832 Body mass index (BMI) 32.0-32.9, adult: Secondary | ICD-10-CM | POA: Diagnosis not present

## 2021-07-25 DIAGNOSIS — J209 Acute bronchitis, unspecified: Secondary | ICD-10-CM | POA: Diagnosis not present

## 2021-07-27 DIAGNOSIS — Z6833 Body mass index (BMI) 33.0-33.9, adult: Secondary | ICD-10-CM | POA: Diagnosis not present

## 2021-07-27 DIAGNOSIS — I2584 Coronary atherosclerosis due to calcified coronary lesion: Secondary | ICD-10-CM | POA: Diagnosis not present

## 2021-07-27 DIAGNOSIS — C159 Malignant neoplasm of esophagus, unspecified: Secondary | ICD-10-CM | POA: Diagnosis not present

## 2021-07-27 DIAGNOSIS — Z20828 Contact with and (suspected) exposure to other viral communicable diseases: Secondary | ICD-10-CM | POA: Diagnosis not present

## 2021-07-29 ENCOUNTER — Encounter: Payer: Self-pay | Admitting: *Deleted

## 2021-07-29 ENCOUNTER — Encounter: Payer: Self-pay | Admitting: Cardiology

## 2021-07-29 ENCOUNTER — Ambulatory Visit (INDEPENDENT_AMBULATORY_CARE_PROVIDER_SITE_OTHER): Payer: Medicare PPO | Admitting: Cardiology

## 2021-07-29 ENCOUNTER — Telehealth: Payer: Self-pay

## 2021-07-29 VITALS — BP 112/68 | Wt 205.0 lb

## 2021-07-29 DIAGNOSIS — R079 Chest pain, unspecified: Secondary | ICD-10-CM | POA: Diagnosis not present

## 2021-07-29 DIAGNOSIS — I25118 Atherosclerotic heart disease of native coronary artery with other forms of angina pectoris: Secondary | ICD-10-CM | POA: Diagnosis not present

## 2021-07-29 DIAGNOSIS — I959 Hypotension, unspecified: Secondary | ICD-10-CM | POA: Diagnosis not present

## 2021-07-29 NOTE — Progress Notes (Signed)
Virtual Visit via Telephone Note   This visit type was conducted due to national recommendations for restrictions regarding the COVID-19 Pandemic (e.g. social distancing) in an effort to limit this patient's exposure and mitigate transmission in our community.  Due to his co-morbid illnesses, this patient is at least at moderate risk for complications without adequate follow up.  This format is felt to be most appropriate for this patient at this time.  The patient did not have access to video technology/had technical difficulties with video requiring transitioning to audio format only (telephone).  All issues noted in this document were discussed and addressed.  No physical exam could be performed with this format.  Please refer to the patient's chart for his  consent to telehealth for [facility]    Date:  07/29/2021   ID:  Adrian Gibson, DOB [birth year], MRN [id number] The patient was identified using 2 identifiers.  Patient Location: Home Provider Location: Office/Clinic   PCP:  [place] Nation, MD   [facility] Providers Cardiologist:  Carlyle Dolly, MD     Evaluation Performed:  Follow-Up Visit  Chief Complaint:  Follow up visit  History of Present Illness:    Adrian Gibson is a 71 y.o. male seen today for follow up of the following medical problems. This is a focused visit on recent issues with chest pain and low bp.    1. CAD   - prior CABG 4 vessel 01/2010 at [facility], normal LVEF at that time.   - exercise MPI 02/2012: 10 min, 13 METs, Duke Treadmill score 10 low risk, no perfusion defects.   - 04/2016 nuclear stress: no ischemia - chronic left sided MSK pain ever since CABG      - 01/2019 echo [facility] LVEF 55-60%, mild LVH, aortic sclerosis.  - 05/2020 LVEF 55-60% - 03/2021 Echo [facility] LVEF 55-60%, mild MR Jan 2023 echo [facility]: LVEF 55-60%, mild MR     - walking up hill gets some some DOE, mild tightness in chest, left jaw and shoudler. Intermittent,  occurs about 3 times a month. - this is highest level of activity since his surgery    Jan 2023 echo [facility]: LVEF 55-60%, mild MR  -since last visit has had some ognoing exertional chest pressure with swimming -  dull tightness mid chest, 1/10 in severity. Mild SOB. Resolves with rest. Occurs with water aerobics.       2. Low blood pressure - working to increase oral hydration - limits his water because of reflux related to his prior esophageal surgery  - bp today 112/68     3. COVID + - currently at home on isolation, managed by pcp         The patient does have symptoms concerning for COVID-19 infection (fever, chills, cough, or new shortness of breath).    Past Medical History:  Diagnosis Date   CAD (coronary artery disease)    multivessel/ left main CAD.[facility] Four-vessel CABG 8/11... Normal left ventricular funciont   Carpal tunnel syndrome    Dyslipidemia    Fusion of spine    Cervical vertebra fusion C5 C6 C7   GERD (gastroesophageal reflux disease)    Nasal polyps 2000   with rhinoplasty   Sleep apnea    Past Surgical History:  Procedure Laterality Date   BIOPSY  09/19/2018   Procedure: BIOPSY;  Surgeon: Daneil Dolin, MD;  Location: [facility];  Service: Endoscopy;;  esophageal   CARPAL TUNNEL RELEASE  left   CATARACT EXTRACTION     Bilateral   CERVICAL LAMINECTOMY     and diskectomy   CORONARY ARTERY BYPASS GRAFT     X 4   ESOPHAGOGASTRODUODENOSCOPY N/A 09/19/2018   Procedure: ESOPHAGOGASTRODUODENOSCOPY (EGD);  Surgeon: Daneil Dolin, MD;  Location: [facility];  Service: Endoscopy;  Laterality: N/A;  8:45am - office spoke with patient   RHINOPLASTY     Nasal   Ulnar Nerve Release from Left       No outpatient medications have been marked as taking for the 07/29/21 encounter (Appointment) with Arnoldo Lenis, MD.     Allergies:   Prochlorperazine, Prochlorperazine edisylate, and Atorvastatin   Social History   Tobacco Use   Smoking  status: Former    Packs/day: 0.25    Years: 15.00    Pack years: 3.75    Types: Cigars, Cigarettes    Start date: 07/10/1971    Quit date: 06/07/1985    Years since quitting: 36.1   Smokeless tobacco: Former    Types: Chew   Tobacco comments:    He has a remote history of smoking cigars, but he quit doing this several years ago.  Vaping Use   Vaping Use: Never used  Substance Use Topics   Alcohol use: Not Currently    Alcohol/week: 0.0 standard drinks    Comment: Occasional beer   Drug use: Never     Family Hx: The patient's family history includes Heart attack (age of onset: 76) in his brother; Heart attack (age of onset: 26) in his father. There is no history of Colon cancer or Colon polyps.  ROS:   Please see the history of present illness.     All other systems reviewed and are negative.   Prior CV studies:   The following studies were reviewed today:  Exercise MPI 02/2012: 10 min, 13 METs, Duke Treadmill score 10 low risk, no perfusion defects   04/2016 Nuclear stress Blood pressure demonstrated a normal response to exercise. There was no ST segment deviation noted during stress. No T wave inversion was noted during stress. The study is normal. This is a low risk study. The left ventricular ejection fraction is hyperdynamic (>65%).   Normal resting and stress perfusion. No ischemia or infarction EF 67%    03/2017 AAA screen Final Interpretation: Abdominal Aorta: No evidence of an abdominal aortic aneurysm was visualized. The largest aortic measurement is 2.5 cm. IVC is patent.   03/2021 echo Exam Date:     04/02/2021 9:10 AM Site Location:     [place] Exam Location:     [place] Admit Date:     04/02/2021  Exam Type:     ECHOCARDIOGRAM FOLLOW UP/LIMITED ECHO  Study Info Indications     - Chest Pain  Complete two-dimensional, color flow and Doppler transthoracic echocardiogram is performed.  Staff Referring Physician:     Silvestre Mesi  ; Sonographer:     Cardell Peach Ordering Physician:     Silvestre Mesi  Account #:     [id number]   Summary  1. The left ventricle is normal in size with normal wall thickness.  2. The left ventricular systolic function is normal, LVEF is visually estimated at 55-60%.  3. There is mild mitral valve regurgitation.  4. The left atrium is mildly dilated in size.  5. The right ventricle is normal in size, with normal systolic function.  6. The right atrium is mildly dilated  in size.  Jan 2023 echo [facility] Summary    1. The left ventricle is normal in size with normal wall thickness.    2. The left ventricular systolic function is normal, LVEF is visually  estimated at 55-60%.    3. There is mild mitral valve regurgitation.    4. The left atrium is mildly dilated in size.    5. The right ventricle is normal in size, with normal systolic function.    6. The right atrium is mildly dilated  in size.   Labs/Other Tests and Data Reviewed:    EKG:  No ECG reviewed.  Recent Labs: No results found for requested labs within last 8760 hours.   Recent Lipid Panel Lab Results  Component Value Date/Time   CHOL (H) 01/07/2010 04:36 AM    278        ATP III CLASSIFICATION:  <200     mg/dL   Desirable  200-239  mg/dL   Borderline High  >=240    mg/dL   High          TRIG 241 (H) 01/07/2010 04:36 AM   HDL 34 (L) 01/07/2010 04:36 AM   CHOLHDL 8.2 01/07/2010 04:36 AM   LDLCALC (H) 01/07/2010 04:36 AM    196        Total Cholesterol/HDL:CHD Risk Coronary Heart Disease Risk Table                     Men   Women  1/2 Average Risk   3.4   3.3  Average Risk       5.0   4.4  2 X Average Risk   9.6   7.1  3 X Average Risk  23.4   11.0        Use the calculated Patient Ratio above and the CHD Risk Table to determine the patient's CHD Risk.        ATP III CLASSIFICATION (LDL):  <100     mg/dL   Optimal  100-129  mg/dL   Near or Above                    Optimal  130-159  mg/dL    Borderline  160-189  mg/dL   High  >190     mg/dL   Very High    Wt Readings from Last 3 Encounters:  06/17/21 213 lb 12.8 oz (97 kg)  06/11/20 241 lb 9.6 oz (109.6 kg)  05/08/19 245 lb (111.1 kg)     Risk Assessment/Calculations:          Objective:    Vital Signs:   Today's Vitals   07/29/21 0951  BP: 112/68  Weight: 205 lb (93 kg)   Body mass index is 30.27 kg/m. Normal affect. Normal speech pattern and tone. Comfortable, no apparent distress. No audible signs of sob or wheezing.   ASSESSMENT & PLAN:   1.  CAD with chronic stable angina -Off ASA since on eliquis for prior DVT - ongoing chest pains with moderate to high levels of exertion - will plan for exercise nuclear stress test to further evaluate - would need to consider ranexa if antianginal therapy is indicated due to low bp's. We dsicussed last visit but he was not in favor at that time.    2. Hypotension - likely due to difficulty with regular hydration due to his drinking issues after her esophageal surgery - bp much better today than last oncolgoy visit - monitor with  ongoign attempts with hydration. If persistent could consider checking AM cortisol level.           COVID-19 Education: The signs and symptoms of COVID-19 were discussed with the patient and how to seek care for testing (follow up with PCP or arrange E-visit).  The importance of social distancing was discussed today.  Time:   Today, I have spent 22 minutes with the patient with telehealth technology discussing the above problems.     Medication Adjustments/Labs and Tests Ordered: Current medicines are reviewed at length with the patient today.  Concerns regarding medicines are outlined above.   Tests Ordered: No orders of the defined types were placed in this encounter.   Medication Changes: No orders of the defined types were placed in this encounter.   Follow Up:  In Person 6 months  Signed, Carlyle Dolly, MD   07/29/2021 8:46 AM    [place]

## 2021-07-29 NOTE — Patient Instructions (Signed)
Medication Instructions:  Your physician recommends that you continue on your current medications as directed. Please refer to the Current Medication list given to you today.  Labwork: none  Testing/Procedures: Your physician has requested that you have en exercise stress myoview. For further information please visit [email or web address]. Please follow instruction sheet, as given.  Follow-Up: Your physician recommends that you schedule a follow-up appointment in: July 2023  Any Other Special Instructions Will Be Listed Below (If Applicable).  If you need a refill on your cardiac medications before your next appointment, please call your pharmacy.

## 2021-07-29 NOTE — Telephone Encounter (Signed)
°  Patient Consent for Virtual Visit        Adrian Gibson has provided verbal consent on 07/29/2021 for a virtual visit (video or telephone).   CONSENT FOR VIRTUAL VISIT FOR:  Adrian Gibson  By participating in this virtual visit I agree to the following:  I hereby voluntarily request, consent and authorize [place] and its employed or contracted physicians, physician assistants, nurse practitioners or other licensed health care professionals (the Practitioner), to provide me with telemedicine health care services (the Services") as deemed necessary by the treating Practitioner. I acknowledge and consent to receive the Services by the Practitioner via telemedicine. I understand that the telemedicine visit will involve communicating with the Practitioner through live audiovisual communication technology and the disclosure of certain medical information by electronic transmission. I acknowledge that I have been given the opportunity to request an in-person assessment or other available alternative prior to the telemedicine visit and am voluntarily participating in the telemedicine visit.  I understand that I have the right to withhold or withdraw my consent to the use of telemedicine in the course of my care at any time, without affecting my right to future care or treatment, and that the Practitioner or I may terminate the telemedicine visit at any time. I understand that I have the right to inspect all information obtained and/or recorded in the course of the telemedicine visit and may receive copies of available information for a reasonable fee.  I understand that some of the potential risks of receiving the Services via telemedicine include:  Delay or interruption in medical evaluation due to technological equipment failure or disruption; Information transmitted may not be sufficient (e.g. poor resolution of images) to allow for appropriate medical decision making by the Practitioner;  and/or  In rare instances, security protocols could fail, causing a breach of personal health information.  Furthermore, I acknowledge that it is my responsibility to provide information about my medical history, conditions and care that is complete and accurate to the best of my ability. I acknowledge that Practitioner's advice, recommendations, and/or decision may be based on factors not within their control, such as incomplete or inaccurate data provided by me or distortions of diagnostic images or specimens that may result from electronic transmissions. I understand that the practice of medicine is not an exact science and that Practitioner makes no warranties or guarantees regarding treatment outcomes. I acknowledge that a copy of this consent can be made available to me via my patient portal ([place]), or I can request a printed copy by calling the office of [place].    I understand that my insurance will be billed for this visit.   I have read or had this consent read to me. I understand the contents of this consent, which adequately explains the benefits and risks of the Services being provided via telemedicine.  I have been provided ample opportunity to ask questions regarding this consent and the Services and have had my questions answered to my satisfaction. I give my informed consent for the services to be provided through the use of telemedicine in my medical care

## 2021-08-03 DIAGNOSIS — I2510 Atherosclerotic heart disease of native coronary artery without angina pectoris: Secondary | ICD-10-CM | POA: Diagnosis not present

## 2021-08-03 DIAGNOSIS — C159 Malignant neoplasm of esophagus, unspecified: Secondary | ICD-10-CM | POA: Diagnosis not present

## 2021-08-06 DIAGNOSIS — I7143 Infrarenal abdominal aortic aneurysm, without rupture: Secondary | ICD-10-CM | POA: Diagnosis not present

## 2021-08-06 DIAGNOSIS — Z951 Presence of aortocoronary bypass graft: Secondary | ICD-10-CM | POA: Diagnosis not present

## 2021-08-06 DIAGNOSIS — K7689 Other specified diseases of liver: Secondary | ICD-10-CM | POA: Diagnosis not present

## 2021-08-06 DIAGNOSIS — I700 Atherosclerosis of aorta: Secondary | ICD-10-CM | POA: Diagnosis not present

## 2021-08-06 DIAGNOSIS — C159 Malignant neoplasm of esophagus, unspecified: Secondary | ICD-10-CM | POA: Diagnosis not present

## 2021-08-06 DIAGNOSIS — I2510 Atherosclerotic heart disease of native coronary artery without angina pectoris: Secondary | ICD-10-CM | POA: Diagnosis not present

## 2021-08-06 DIAGNOSIS — Z9889 Other specified postprocedural states: Secondary | ICD-10-CM | POA: Diagnosis not present

## 2021-08-06 DIAGNOSIS — I517 Cardiomegaly: Secondary | ICD-10-CM | POA: Diagnosis not present

## 2021-08-06 DIAGNOSIS — R918 Other nonspecific abnormal finding of lung field: Secondary | ICD-10-CM | POA: Diagnosis not present

## 2021-08-19 DIAGNOSIS — C159 Malignant neoplasm of esophagus, unspecified: Secondary | ICD-10-CM | POA: Diagnosis not present

## 2021-08-21 ENCOUNTER — Other Ambulatory Visit: Payer: Self-pay

## 2021-08-21 ENCOUNTER — Ambulatory Visit (HOSPITAL_COMMUNITY)
Admission: RE | Admit: 2021-08-21 | Discharge: 2021-08-21 | Disposition: A | Discharge status: Home / Self Care | Payer: Medicare PPO | Source: Ambulatory Visit | Attending: Cardiology | Admitting: Cardiology

## 2021-08-21 ENCOUNTER — Encounter (HOSPITAL_COMMUNITY): Payer: Self-pay

## 2021-08-21 ENCOUNTER — Encounter (HOSPITAL_COMMUNITY)
Admission: RE | Admit: 2021-08-21 | Discharge: 2021-08-21 | Disposition: A | Discharge status: Home / Self Care | Payer: Medicare PPO | Source: Ambulatory Visit | Attending: Cardiology | Admitting: Cardiology

## 2021-08-21 DIAGNOSIS — R079 Chest pain, unspecified: Secondary | ICD-10-CM

## 2021-08-21 LAB — NM MYOCAR MULTI W/SPECT W/WALL MOTION / EF
Angina Index: 0
Duke Treadmill Score: 4
Estimated workload: 7
Exercise duration (min): 4 min
Exercise duration (sec): 15 s
LV dias vol: 93 mL (ref 62–150)
LV sys vol: 29 mL
MPHR: 147 {beats}/min
Nuc Stress EF: 69 %
Peak HR: 116 {beats}/min
Percent HR: 78 %
RATE: 0.2
RPE: 11
Rest HR: 57 {beats}/min
Rest Nuclear Isotope Dose: 11 mCi
SDS: 1
SRS: 3
SSS: 4
ST Depression (mm): 0 mm
Stress Nuclear Isotope Dose: 33 mCi
TID: 0.75

## 2021-08-21 MED ORDER — TECHNETIUM TC 99M TETROFOSMIN IV KIT
10.0000 | PACK | Freq: Once | INTRAVENOUS | Status: AC | PRN
Start: 2021-08-21 — End: 2021-08-21
  Administered 2021-08-21: 11 via INTRAVENOUS

## 2021-08-21 MED ORDER — TECHNETIUM TC 99M TETROFOSMIN IV KIT
30.0000 | PACK | Freq: Once | INTRAVENOUS | Status: AC | PRN
  Administered 2021-08-21: 33 via INTRAVENOUS

## 2021-08-21 MED ORDER — REGADENOSON 0.4 MG/5ML IV SOLN
INTRAVENOUS | Status: AC
  Administered 2021-08-21: 0.4 mg via INTRAVENOUS
  Filled 2021-08-21: qty 5

## 2021-08-21 MED ORDER — SODIUM CHLORIDE FLUSH 0.9 % IV SOLN
INTRAVENOUS | Status: AC
  Administered 2021-08-21: 10 mL via INTRAVENOUS
  Filled 2021-08-21: qty 10

## 2021-08-27 ENCOUNTER — Telehealth: Payer: Self-pay

## 2021-08-27 NOTE — Telephone Encounter (Signed)
Patient notified and verbalized understanding. Pt asked for copy of results to be sent to [facility]- Dr. Laurance Flatten.  ?

## 2021-08-27 NOTE — Telephone Encounter (Signed)
-----   Message from Arnoldo Lenis, MD sent at 08/26/2021  4:26 PM EDT ----- ?NOrmal stress test, no significant blockages are noted ? ?Zandra Abts MD ?

## 2021-09-21 DIAGNOSIS — C159 Malignant neoplasm of esophagus, unspecified: Secondary | ICD-10-CM | POA: Diagnosis not present

## 2021-09-21 DIAGNOSIS — I2510 Atherosclerotic heart disease of native coronary artery without angina pectoris: Secondary | ICD-10-CM | POA: Diagnosis not present

## 2021-09-22 DIAGNOSIS — E43 Unspecified severe protein-calorie malnutrition: Secondary | ICD-10-CM | POA: Diagnosis not present

## 2021-09-22 DIAGNOSIS — C159 Malignant neoplasm of esophagus, unspecified: Secondary | ICD-10-CM | POA: Diagnosis not present

## 2021-09-22 DIAGNOSIS — E785 Hyperlipidemia, unspecified: Secondary | ICD-10-CM | POA: Diagnosis not present

## 2021-09-22 DIAGNOSIS — L271 Localized skin eruption due to drugs and medicaments taken internally: Secondary | ICD-10-CM | POA: Diagnosis not present

## 2021-09-22 DIAGNOSIS — Z09 Encounter for follow-up examination after completed treatment for conditions other than malignant neoplasm: Secondary | ICD-10-CM | POA: Diagnosis not present

## 2021-09-22 DIAGNOSIS — T451X5A Adverse effect of antineoplastic and immunosuppressive drugs, initial encounter: Secondary | ICD-10-CM | POA: Diagnosis not present

## 2021-09-22 DIAGNOSIS — R112 Nausea with vomiting, unspecified: Secondary | ICD-10-CM | POA: Diagnosis not present

## 2021-09-22 DIAGNOSIS — G620 Drug-induced polyneuropathy: Secondary | ICD-10-CM | POA: Diagnosis not present

## 2021-09-22 DIAGNOSIS — C155 Malignant neoplasm of lower third of esophagus: Secondary | ICD-10-CM | POA: Diagnosis not present

## 2021-09-24 DIAGNOSIS — C155 Malignant neoplasm of lower third of esophagus: Secondary | ICD-10-CM | POA: Diagnosis not present

## 2021-10-08 DIAGNOSIS — L309 Dermatitis, unspecified: Secondary | ICD-10-CM | POA: Diagnosis not present

## 2021-10-13 DIAGNOSIS — E785 Hyperlipidemia, unspecified: Secondary | ICD-10-CM | POA: Diagnosis not present

## 2021-10-13 DIAGNOSIS — C155 Malignant neoplasm of lower third of esophagus: Secondary | ICD-10-CM | POA: Diagnosis not present

## 2021-10-13 DIAGNOSIS — Z09 Encounter for follow-up examination after completed treatment for conditions other than malignant neoplasm: Secondary | ICD-10-CM | POA: Diagnosis not present

## 2021-10-15 DIAGNOSIS — C155 Malignant neoplasm of lower third of esophagus: Secondary | ICD-10-CM | POA: Diagnosis not present

## 2021-10-20 DIAGNOSIS — I10 Essential (primary) hypertension: Secondary | ICD-10-CM | POA: Diagnosis not present

## 2021-10-20 DIAGNOSIS — R059 Cough, unspecified: Secondary | ICD-10-CM | POA: Diagnosis not present

## 2021-10-20 DIAGNOSIS — J209 Acute bronchitis, unspecified: Secondary | ICD-10-CM | POA: Diagnosis not present

## 2021-10-20 DIAGNOSIS — Z6832 Body mass index (BMI) 32.0-32.9, adult: Secondary | ICD-10-CM | POA: Diagnosis not present

## 2021-10-20 DIAGNOSIS — K219 Gastro-esophageal reflux disease without esophagitis: Secondary | ICD-10-CM | POA: Diagnosis not present

## 2021-11-03 DIAGNOSIS — C155 Malignant neoplasm of lower third of esophagus: Secondary | ICD-10-CM | POA: Diagnosis not present

## 2021-11-05 DIAGNOSIS — Z95828 Presence of other vascular implants and grafts: Secondary | ICD-10-CM | POA: Diagnosis not present

## 2021-11-05 DIAGNOSIS — C155 Malignant neoplasm of lower third of esophagus: Secondary | ICD-10-CM | POA: Diagnosis not present

## 2021-11-05 DIAGNOSIS — G620 Drug-induced polyneuropathy: Secondary | ICD-10-CM | POA: Diagnosis not present

## 2021-11-05 DIAGNOSIS — K2960 Other gastritis without bleeding: Secondary | ICD-10-CM | POA: Diagnosis not present

## 2021-11-05 DIAGNOSIS — Z5112 Encounter for antineoplastic immunotherapy: Secondary | ICD-10-CM | POA: Diagnosis not present

## 2021-11-05 DIAGNOSIS — T451X5A Adverse effect of antineoplastic and immunosuppressive drugs, initial encounter: Secondary | ICD-10-CM | POA: Diagnosis not present

## 2021-11-05 DIAGNOSIS — Z09 Encounter for follow-up examination after completed treatment for conditions other than malignant neoplasm: Secondary | ICD-10-CM | POA: Diagnosis not present

## 2021-11-05 DIAGNOSIS — Z923 Personal history of irradiation: Secondary | ICD-10-CM | POA: Diagnosis not present

## 2021-11-12 DIAGNOSIS — C155 Malignant neoplasm of lower third of esophagus: Secondary | ICD-10-CM | POA: Diagnosis not present

## 2021-11-12 DIAGNOSIS — J209 Acute bronchitis, unspecified: Secondary | ICD-10-CM | POA: Diagnosis not present

## 2021-11-12 DIAGNOSIS — Z6831 Body mass index (BMI) 31.0-31.9, adult: Secondary | ICD-10-CM | POA: Diagnosis not present

## 2021-11-12 DIAGNOSIS — I10 Essential (primary) hypertension: Secondary | ICD-10-CM | POA: Diagnosis not present

## 2021-11-12 DIAGNOSIS — Z95828 Presence of other vascular implants and grafts: Secondary | ICD-10-CM | POA: Diagnosis not present

## 2021-11-12 DIAGNOSIS — K219 Gastro-esophageal reflux disease without esophagitis: Secondary | ICD-10-CM | POA: Diagnosis not present

## 2021-11-19 DIAGNOSIS — K222 Esophageal obstruction: Secondary | ICD-10-CM | POA: Diagnosis not present

## 2021-11-19 DIAGNOSIS — Z8501 Personal history of malignant neoplasm of esophagus: Secondary | ICD-10-CM | POA: Diagnosis not present

## 2021-11-19 DIAGNOSIS — I77811 Abdominal aortic ectasia: Secondary | ICD-10-CM | POA: Diagnosis not present

## 2021-11-19 DIAGNOSIS — J9811 Atelectasis: Secondary | ICD-10-CM | POA: Diagnosis not present

## 2021-11-19 DIAGNOSIS — J984 Other disorders of lung: Secondary | ICD-10-CM | POA: Diagnosis not present

## 2021-11-19 DIAGNOSIS — C155 Malignant neoplasm of lower third of esophagus: Secondary | ICD-10-CM | POA: Diagnosis not present

## 2021-11-19 DIAGNOSIS — I700 Atherosclerosis of aorta: Secondary | ICD-10-CM | POA: Diagnosis not present

## 2021-11-19 DIAGNOSIS — K7689 Other specified diseases of liver: Secondary | ICD-10-CM | POA: Diagnosis not present

## 2021-11-24 DIAGNOSIS — C155 Malignant neoplasm of lower third of esophagus: Secondary | ICD-10-CM | POA: Diagnosis not present

## 2021-11-25 DIAGNOSIS — K2960 Other gastritis without bleeding: Secondary | ICD-10-CM | POA: Diagnosis not present

## 2021-11-25 DIAGNOSIS — Z09 Encounter for follow-up examination after completed treatment for conditions other than malignant neoplasm: Secondary | ICD-10-CM | POA: Diagnosis not present

## 2021-11-25 DIAGNOSIS — T451X5A Adverse effect of antineoplastic and immunosuppressive drugs, initial encounter: Secondary | ICD-10-CM | POA: Diagnosis not present

## 2021-11-25 DIAGNOSIS — G620 Drug-induced polyneuropathy: Secondary | ICD-10-CM | POA: Diagnosis not present

## 2021-11-25 DIAGNOSIS — C155 Malignant neoplasm of lower third of esophagus: Secondary | ICD-10-CM | POA: Diagnosis not present

## 2021-11-25 DIAGNOSIS — Z95828 Presence of other vascular implants and grafts: Secondary | ICD-10-CM | POA: Diagnosis not present

## 2021-11-26 DIAGNOSIS — C155 Malignant neoplasm of lower third of esophagus: Secondary | ICD-10-CM | POA: Diagnosis not present

## 2021-11-26 DIAGNOSIS — Z5112 Encounter for antineoplastic immunotherapy: Secondary | ICD-10-CM | POA: Diagnosis not present

## 2021-12-02 DIAGNOSIS — H40013 Open angle with borderline findings, low risk, bilateral: Secondary | ICD-10-CM | POA: Diagnosis not present

## 2021-12-02 DIAGNOSIS — Z961 Presence of intraocular lens: Secondary | ICD-10-CM | POA: Diagnosis not present

## 2021-12-02 DIAGNOSIS — H35371 Puckering of macula, right eye: Secondary | ICD-10-CM | POA: Diagnosis not present

## 2021-12-04 DIAGNOSIS — C155 Malignant neoplasm of lower third of esophagus: Secondary | ICD-10-CM | POA: Diagnosis not present

## 2021-12-14 ENCOUNTER — Ambulatory Visit: Payer: Medicare PPO | Admitting: Cardiology

## 2021-12-19 DIAGNOSIS — R030 Elevated blood-pressure reading, without diagnosis of hypertension: Secondary | ICD-10-CM | POA: Diagnosis not present

## 2021-12-19 DIAGNOSIS — J209 Acute bronchitis, unspecified: Secondary | ICD-10-CM | POA: Diagnosis not present

## 2021-12-19 DIAGNOSIS — Z6832 Body mass index (BMI) 32.0-32.9, adult: Secondary | ICD-10-CM | POA: Diagnosis not present

## 2021-12-19 DIAGNOSIS — K219 Gastro-esophageal reflux disease without esophagitis: Secondary | ICD-10-CM | POA: Diagnosis not present

## 2021-12-22 DIAGNOSIS — T451X5A Adverse effect of antineoplastic and immunosuppressive drugs, initial encounter: Secondary | ICD-10-CM | POA: Diagnosis not present

## 2021-12-22 DIAGNOSIS — C155 Malignant neoplasm of lower third of esophagus: Secondary | ICD-10-CM | POA: Diagnosis not present

## 2021-12-22 DIAGNOSIS — I2510 Atherosclerotic heart disease of native coronary artery without angina pectoris: Secondary | ICD-10-CM | POA: Diagnosis not present

## 2021-12-22 DIAGNOSIS — J690 Pneumonitis due to inhalation of food and vomit: Secondary | ICD-10-CM | POA: Diagnosis not present

## 2021-12-22 DIAGNOSIS — R112 Nausea with vomiting, unspecified: Secondary | ICD-10-CM | POA: Diagnosis not present

## 2021-12-22 DIAGNOSIS — K2960 Other gastritis without bleeding: Secondary | ICD-10-CM | POA: Diagnosis not present

## 2021-12-22 DIAGNOSIS — Z09 Encounter for follow-up examination after completed treatment for conditions other than malignant neoplasm: Secondary | ICD-10-CM | POA: Diagnosis not present

## 2021-12-22 DIAGNOSIS — C159 Malignant neoplasm of esophagus, unspecified: Secondary | ICD-10-CM | POA: Diagnosis not present

## 2021-12-22 DIAGNOSIS — G620 Drug-induced polyneuropathy: Secondary | ICD-10-CM | POA: Diagnosis not present

## 2021-12-22 DIAGNOSIS — Z95828 Presence of other vascular implants and grafts: Secondary | ICD-10-CM | POA: Diagnosis not present

## 2022-01-07 DIAGNOSIS — C155 Malignant neoplasm of lower third of esophagus: Secondary | ICD-10-CM | POA: Diagnosis not present

## 2022-01-07 DIAGNOSIS — C50929 Malignant neoplasm of unspecified site of unspecified male breast: Secondary | ICD-10-CM | POA: Diagnosis not present

## 2022-01-07 DIAGNOSIS — Z09 Encounter for follow-up examination after completed treatment for conditions other than malignant neoplasm: Secondary | ICD-10-CM | POA: Diagnosis not present

## 2022-01-15 DIAGNOSIS — C155 Malignant neoplasm of lower third of esophagus: Secondary | ICD-10-CM | POA: Diagnosis not present

## 2022-01-15 DIAGNOSIS — Z95828 Presence of other vascular implants and grafts: Secondary | ICD-10-CM | POA: Diagnosis not present

## 2022-01-29 DIAGNOSIS — Z7901 Long term (current) use of anticoagulants: Secondary | ICD-10-CM | POA: Diagnosis not present

## 2022-01-29 DIAGNOSIS — J00 Acute nasopharyngitis [common cold]: Secondary | ICD-10-CM | POA: Diagnosis not present

## 2022-01-29 DIAGNOSIS — R509 Fever, unspecified: Secondary | ICD-10-CM | POA: Diagnosis not present

## 2022-01-29 DIAGNOSIS — K219 Gastro-esophageal reflux disease without esophagitis: Secondary | ICD-10-CM | POA: Diagnosis not present

## 2022-01-29 DIAGNOSIS — Z760 Encounter for issue of repeat prescription: Secondary | ICD-10-CM | POA: Diagnosis not present

## 2022-01-29 DIAGNOSIS — R059 Cough, unspecified: Secondary | ICD-10-CM | POA: Diagnosis not present

## 2022-01-29 DIAGNOSIS — I2510 Atherosclerotic heart disease of native coronary artery without angina pectoris: Secondary | ICD-10-CM | POA: Diagnosis not present

## 2022-01-29 DIAGNOSIS — R519 Headache, unspecified: Secondary | ICD-10-CM | POA: Diagnosis not present

## 2022-02-10 DIAGNOSIS — R112 Nausea with vomiting, unspecified: Secondary | ICD-10-CM | POA: Diagnosis not present

## 2022-02-10 DIAGNOSIS — Z923 Personal history of irradiation: Secondary | ICD-10-CM | POA: Diagnosis not present

## 2022-02-10 DIAGNOSIS — R059 Cough, unspecified: Secondary | ICD-10-CM | POA: Diagnosis not present

## 2022-02-10 DIAGNOSIS — T451X5A Adverse effect of antineoplastic and immunosuppressive drugs, initial encounter: Secondary | ICD-10-CM | POA: Diagnosis not present

## 2022-02-10 DIAGNOSIS — M109 Gout, unspecified: Secondary | ICD-10-CM | POA: Diagnosis not present

## 2022-02-10 DIAGNOSIS — J189 Pneumonia, unspecified organism: Secondary | ICD-10-CM | POA: Diagnosis not present

## 2022-02-10 DIAGNOSIS — Z7901 Long term (current) use of anticoagulants: Secondary | ICD-10-CM | POA: Diagnosis not present

## 2022-02-10 DIAGNOSIS — Z09 Encounter for follow-up examination after completed treatment for conditions other than malignant neoplasm: Secondary | ICD-10-CM | POA: Diagnosis not present

## 2022-02-10 DIAGNOSIS — C155 Malignant neoplasm of lower third of esophagus: Secondary | ICD-10-CM | POA: Diagnosis not present

## 2022-02-10 DIAGNOSIS — E7800 Pure hypercholesterolemia, unspecified: Secondary | ICD-10-CM | POA: Diagnosis not present

## 2022-02-10 DIAGNOSIS — Z79899 Other long term (current) drug therapy: Secondary | ICD-10-CM | POA: Diagnosis not present

## 2022-02-10 DIAGNOSIS — G620 Drug-induced polyneuropathy: Secondary | ICD-10-CM | POA: Diagnosis not present

## 2022-02-24 DIAGNOSIS — K5730 Diverticulosis of large intestine without perforation or abscess without bleeding: Secondary | ICD-10-CM | POA: Diagnosis not present

## 2022-02-24 DIAGNOSIS — I700 Atherosclerosis of aorta: Secondary | ICD-10-CM | POA: Diagnosis not present

## 2022-02-24 DIAGNOSIS — C155 Malignant neoplasm of lower third of esophagus: Secondary | ICD-10-CM | POA: Diagnosis not present

## 2022-02-24 DIAGNOSIS — I714 Abdominal aortic aneurysm, without rupture, unspecified: Secondary | ICD-10-CM | POA: Diagnosis not present

## 2022-02-24 DIAGNOSIS — K7689 Other specified diseases of liver: Secondary | ICD-10-CM | POA: Diagnosis not present

## 2022-02-24 DIAGNOSIS — R918 Other nonspecific abnormal finding of lung field: Secondary | ICD-10-CM | POA: Diagnosis not present

## 2022-03-03 DIAGNOSIS — C155 Malignant neoplasm of lower third of esophagus: Secondary | ICD-10-CM | POA: Diagnosis not present

## 2022-03-30 DIAGNOSIS — M792 Neuralgia and neuritis, unspecified: Secondary | ICD-10-CM | POA: Diagnosis not present

## 2022-03-30 DIAGNOSIS — M9902 Segmental and somatic dysfunction of thoracic region: Secondary | ICD-10-CM | POA: Diagnosis not present

## 2022-03-30 DIAGNOSIS — M546 Pain in thoracic spine: Secondary | ICD-10-CM | POA: Diagnosis not present

## 2022-04-01 DIAGNOSIS — C155 Malignant neoplasm of lower third of esophagus: Secondary | ICD-10-CM | POA: Diagnosis not present

## 2022-04-02 DIAGNOSIS — M546 Pain in thoracic spine: Secondary | ICD-10-CM | POA: Diagnosis not present

## 2022-04-02 DIAGNOSIS — M9902 Segmental and somatic dysfunction of thoracic region: Secondary | ICD-10-CM | POA: Diagnosis not present

## 2022-04-02 DIAGNOSIS — M792 Neuralgia and neuritis, unspecified: Secondary | ICD-10-CM | POA: Diagnosis not present

## 2022-04-06 DIAGNOSIS — M546 Pain in thoracic spine: Secondary | ICD-10-CM | POA: Diagnosis not present

## 2022-04-06 DIAGNOSIS — M792 Neuralgia and neuritis, unspecified: Secondary | ICD-10-CM | POA: Diagnosis not present

## 2022-04-06 DIAGNOSIS — M9902 Segmental and somatic dysfunction of thoracic region: Secondary | ICD-10-CM | POA: Diagnosis not present

## 2022-04-08 DIAGNOSIS — R1310 Dysphagia, unspecified: Secondary | ICD-10-CM | POA: Diagnosis not present

## 2022-04-08 DIAGNOSIS — I2510 Atherosclerotic heart disease of native coronary artery without angina pectoris: Secondary | ICD-10-CM | POA: Diagnosis not present

## 2022-04-08 DIAGNOSIS — D490 Neoplasm of unspecified behavior of digestive system: Secondary | ICD-10-CM | POA: Diagnosis not present

## 2022-04-08 DIAGNOSIS — G4730 Sleep apnea, unspecified: Secondary | ICD-10-CM | POA: Diagnosis not present

## 2022-04-08 DIAGNOSIS — E7800 Pure hypercholesterolemia, unspecified: Secondary | ICD-10-CM | POA: Diagnosis not present

## 2022-04-08 DIAGNOSIS — C159 Malignant neoplasm of esophagus, unspecified: Secondary | ICD-10-CM | POA: Diagnosis not present

## 2022-04-08 DIAGNOSIS — C155 Malignant neoplasm of lower third of esophagus: Secondary | ICD-10-CM | POA: Diagnosis not present

## 2022-04-09 DIAGNOSIS — M546 Pain in thoracic spine: Secondary | ICD-10-CM | POA: Diagnosis not present

## 2022-04-09 DIAGNOSIS — M9902 Segmental and somatic dysfunction of thoracic region: Secondary | ICD-10-CM | POA: Diagnosis not present

## 2022-04-09 DIAGNOSIS — M792 Neuralgia and neuritis, unspecified: Secondary | ICD-10-CM | POA: Diagnosis not present

## 2022-04-11 DIAGNOSIS — E7800 Pure hypercholesterolemia, unspecified: Secondary | ICD-10-CM | POA: Diagnosis not present

## 2022-04-11 DIAGNOSIS — R062 Wheezing: Secondary | ICD-10-CM | POA: Diagnosis not present

## 2022-04-11 DIAGNOSIS — J90 Pleural effusion, not elsewhere classified: Secondary | ICD-10-CM | POA: Diagnosis not present

## 2022-04-11 DIAGNOSIS — I2510 Atherosclerotic heart disease of native coronary artery without angina pectoris: Secondary | ICD-10-CM | POA: Diagnosis not present

## 2022-04-11 DIAGNOSIS — Z8501 Personal history of malignant neoplasm of esophagus: Secondary | ICD-10-CM | POA: Diagnosis not present

## 2022-04-11 DIAGNOSIS — K219 Gastro-esophageal reflux disease without esophagitis: Secondary | ICD-10-CM | POA: Diagnosis not present

## 2022-04-11 DIAGNOSIS — Z79899 Other long term (current) drug therapy: Secondary | ICD-10-CM | POA: Diagnosis not present

## 2022-04-11 DIAGNOSIS — R600 Localized edema: Secondary | ICD-10-CM | POA: Diagnosis not present

## 2022-04-11 DIAGNOSIS — Z951 Presence of aortocoronary bypass graft: Secondary | ICD-10-CM | POA: Diagnosis not present

## 2022-04-11 DIAGNOSIS — R059 Cough, unspecified: Secondary | ICD-10-CM | POA: Diagnosis not present

## 2022-04-11 DIAGNOSIS — Z87891 Personal history of nicotine dependence: Secondary | ICD-10-CM | POA: Diagnosis not present

## 2022-04-11 DIAGNOSIS — J209 Acute bronchitis, unspecified: Secondary | ICD-10-CM | POA: Diagnosis not present

## 2022-04-12 DIAGNOSIS — M792 Neuralgia and neuritis, unspecified: Secondary | ICD-10-CM | POA: Diagnosis not present

## 2022-04-12 DIAGNOSIS — M9902 Segmental and somatic dysfunction of thoracic region: Secondary | ICD-10-CM | POA: Diagnosis not present

## 2022-04-12 DIAGNOSIS — M546 Pain in thoracic spine: Secondary | ICD-10-CM | POA: Diagnosis not present

## 2022-04-13 ENCOUNTER — Encounter: Payer: Self-pay | Admitting: Cardiology

## 2022-04-13 ENCOUNTER — Ambulatory Visit: Payer: Medicare PPO | Attending: Cardiology | Admitting: Cardiology

## 2022-04-13 VITALS — BP 120/60 | HR 58 | Ht 69.0 in | Wt 213.4 lb

## 2022-04-13 DIAGNOSIS — I959 Hypotension, unspecified: Secondary | ICD-10-CM | POA: Diagnosis not present

## 2022-04-13 DIAGNOSIS — I25118 Atherosclerotic heart disease of native coronary artery with other forms of angina pectoris: Secondary | ICD-10-CM | POA: Diagnosis not present

## 2022-04-13 DIAGNOSIS — E782 Mixed hyperlipidemia: Secondary | ICD-10-CM | POA: Diagnosis not present

## 2022-04-13 NOTE — Patient Instructions (Signed)

## 2022-04-13 NOTE — Progress Notes (Signed)
Clinical Summary Mr. Freer is a 71 y.o.male seen today for follow up of the following medical problems.  1. CAD   - prior CABG 4 vessel 01/2010 at [facility], normal LVEF at that time.   - exercise MPI 02/2012: 10 min, 13 METs, Duke Treadmill score 10 low risk, no perfusion defects.   - 04/2016 nuclear stress: no ischemia - chronic left sided MSK pain ever since CABG      - 01/2019 echo [facility] LVEF 55-60%, mild LVH, aortic sclerosis.  - 05/2020 LVEF 55-60% - 03/2021 Echo [facility] LVEF 55-60%, mild MR Jan 2023 echo [facility]: LVEF 55-60%, mild MR     - walking up hill gets some some DOE, mild tightness in chest, left jaw and shoudler. Intermittent, occurs about 3 times a month. - this is highest level of activity since his surgery     Jan 2023 echo [facility]: LVEF 55-60%, mild MR   -since last visit has had some ognoing exertional chest pressure with swimming -  dull tightness mid chest, 1/10 in severity. Mild SOB. Resolves with rest. Occurs with water aerobics.       08/2021 nuclear stress: no ischemia  - different parts of chest. Can feel midchest, tightness with deep breaths. No set exertional symptoms. Some cough last week, currently abx for bronchitis. Can be right sided under rib, not positional. Can be left sided at times.      2. Low blood pressure - working to increase oral hydration - limits his water because of reflux related to his prior esophageal surgery  - bp today 112/68  - reports some low bp's at home at times, orthostatic symptoms - working to increase electrolyte      2. Hyperlipidemia   - has tried multiple different statins. Has worked with Dr Nadara Mustard to find a regimen.  - tolerating crestor '20mg'$  daily.  08/2016 TC 192 TG 132 HDL 42 LDL 124 - he is compliant with statin - labs followed by pcp     3. 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      4. History of DVT - compliant with eliquis -= developed around port a cath. - followed by heme/onc    5. 3.2 cm AAA - needs [place] 2026   Youngest son is surgical tech at [employer]   Past Medical History:  Diagnosis Date   CAD (coronary artery disease)    multivessel/ left main CAD.Marland Kitchen Four-vessel CABG 8/11... Normal left ventricular funciont   Carpal tunnel syndrome    Dyslipidemia    Fusion of spine    Cervical vertebra fusion C5 C6 C7   GERD (gastroesophageal reflux disease)    Nasal polyps 2000   with rhinoplasty   Sleep apnea      Allergies  Allergen Reactions   Prochlorperazine Other (See Comments)    Neck muscles drew up/lip muscles drew up   Prochlorperazine Edisylate    Atorvastatin Other (See Comments)    myalgia     Current Outpatient Medications  Medication Sig Dispense Refill   allopurinol (ZYLOPRIM) 100 MG tablet Take 100 mg by mouth at bedtime.      apixaban (ELIQUIS) 5 MG  TABS tablet Take 5 mg by mouth 2 (two) times daily.     Carboxymethylcellulose Sodium 0.25 % SOLN Apply 1 drop to eye 2 (two) times daily as needed (dry/irritated eyes.).     Cholecalciferol (VITAMIN D3 PO) Take 1 tablet by mouth daily.     fluticasone (FLONASE) 50 MCG/ACT nasal spray Place 2 sprays into the nose daily as needed (congestion).     gabapentin (NEURONTIN) 400 MG capsule Take 1 capsule by mouth 3 (three) times daily.     Multiple Vitamin (MULTIVITAMIN WITH MINERALS) TABS tablet Take 1 tablet by mouth daily.     Multiple Vitamins-Minerals (ZINC PO) Take 1 tablet by mouth daily.     Nirmatrelvir-Ritonavir (PAXLOVID PO) Take 1 tablet by mouth 2 (two) times daily.     nitroGLYCERIN (NITROSTAT) 0.4 MG SL tablet TAKE 1 TABLET BY MOUTH UNDER TONGUE EVERY 5 MINUTES AS NEEDED (Patient  taking differently: Place 0.4 mg under the tongue every 5 (five) minutes as needed for chest pain.) 25 tablet 3   pantoprazole (PROTONIX) 40 MG tablet Take 1 tablet (40 mg total) by mouth daily. 30 minutes before breakfast 90 tablet 3   pantoprazole (PROTONIX) 40 MG tablet Take by mouth.     rosuvastatin (CRESTOR) 20 MG tablet Take 20 mg by mouth every evening.      sucralfate (CARAFATE) 1 GM/10ML suspension Take by mouth as needed.     No current facility-administered medications for this visit.     Past Surgical History:  Procedure Laterality Date   BIOPSY  09/19/2018   Procedure: BIOPSY;  Surgeon: Daneil Dolin, MD;  Location: [facility];  Service: Endoscopy;;  esophageal   CARPAL TUNNEL RELEASE     left   CATARACT EXTRACTION     Bilateral   CERVICAL LAMINECTOMY     and diskectomy   CORONARY ARTERY BYPASS GRAFT     X 4   ESOPHAGOGASTRODUODENOSCOPY N/A 09/19/2018   Procedure: ESOPHAGOGASTRODUODENOSCOPY (EGD);  Surgeon: Daneil Dolin, MD;  Location: [facility];  Service: Endoscopy;  Laterality: N/A;  8:45am - office spoke with patient   RHINOPLASTY     Nasal   Ulnar Nerve Release from Left       Allergies  Allergen Reactions   Prochlorperazine Other (See Comments)    Neck muscles drew up/lip muscles drew up   Prochlorperazine Edisylate    Atorvastatin Other (See Comments)    myalgia      Family History  Problem Relation Age of Onset   Heart attack Father 39   Heart attack Brother 109       Heavy smoker   Colon cancer Neg Hx    Colon polyps Neg Hx      Social History Mr. Deeley reports that he quit smoking about 36 years ago. His smoking use included cigars and cigarettes. He started smoking about 50 years ago. He has a 3.75 pack-year smoking history. He has quit using smokeless tobacco.  His smokeless tobacco use included chew. Mr. Schwandt reports that he does not currently use alcohol.   Review of Systems CONSTITUTIONAL: No weight loss, fever,  chills, weakness or fatigue.  HEENT: Eyes: No visual loss, blurred vision, double vision or yellow sclerae.No hearing loss, sneezing, congestion, runny nose or sore throat.  SKIN: No rash or itching.  CARDIOVASCULAR: per hpi RESPIRATORY: No shortness of breath, cough or sputum.  GASTROINTESTINAL: No anorexia, nausea, vomiting or diarrhea. No abdominal pain or blood.  GENITOURINARY: No burning on urination,  no polyuria NEUROLOGICAL: No headache, dizziness, syncope, paralysis, ataxia, numbness or tingling in the extremities. No change in bowel or bladder control.  MUSCULOSKELETAL: No muscle, back pain, joint pain or stiffness.  LYMPHATICS: No enlarged nodes. No history of splenectomy.  PSYCHIATRIC: No history of depression or anxiety.  ENDOCRINOLOGIC: No reports of sweating, cold or heat intolerance. No polyuria or polydipsia.  Marland Kitchen   Physical Examination Today's Vitals   04/13/22 1418  BP: 120/60  Pulse: (!) 58  SpO2: 95%  Weight: 213 lb 6.4 oz (96.8 kg)  Height: '5\' 9"'$  (1.753 m)   Body mass index is 31.51 kg/m.  Gen: resting comfortably, no acute distress HEENT: no scleral icterus, pupils equal round and reactive, no palptable cervical adenopathy,  CV: RRR, no m/rg, no jvd Resp: Clear to auscultation bilaterally GI: abdomen is soft, non-tender, non-distended, normal bowel sounds, no hepatosplenomegaly MSK: extremities are warm, no edema.  Skin: warm, no rash Neuro:  no focal deficits Psych: appropriate affect   Diagnostic Studies Exercise MPI 02/2012: 10 min, 13 METs, Duke Treadmill score 10 low risk, no perfusion defects   04/2016 Nuclear stress Blood pressure demonstrated a normal response to exercise. There was no ST segment deviation noted during stress. No T wave inversion was noted during stress. The study is normal. This is a low risk study. The left ventricular ejection fraction is hyperdynamic (>65%).   Normal resting and stress perfusion. No ischemia or  infarction EF 67%    03/2017 AAA screen Final Interpretation: Abdominal Aorta: No evidence of an abdominal aortic aneurysm was visualized. The largest aortic measurement is 2.5 cm. IVC is patent.   03/2021 echo Exam Date:     04/02/2021 9:10 AM Site Location:     [place] Exam Location:     [place] Admit Date:     04/02/2021  Exam Type:     ECHOCARDIOGRAM FOLLOW UP/LIMITED ECHO  Study Info Indications     - Chest Pain  Complete two-dimensional, color flow and Doppler transthoracic echocardiogram is performed.  Staff Referring Physician:     Silvestre Mesi ; Sonographer:     Cardell Peach Ordering Physician:     Silvestre Mesi  Account #:     [id number]   Summary  1. The left ventricle is normal in size with normal wall thickness.  2. The left ventricular systolic function is normal, LVEF is visually estimated at 55-60%.  3. There is mild mitral valve regurgitation.  4. The left atrium is mildly dilated in size.  5. The right ventricle is normal in size, with normal systolic function.  6. The right atrium is mildly dilated  in size.   Jan 2023 echo [facility] Summary    1. The left ventricle is normal in size with normal wall thickness.    2. The left ventricular systolic function is normal, LVEF is visually  estimated at 55-60%.    3. There is mild mitral valve regurgitation.    4. The left atrium is mildly dilated in size.    5. The right ventricle is normal in size, with normal systolic function.    6. The right atrium is mildly dilated  in size.     08/2021 nuclear stress The study is normal. The study is low risk.   No ST deviation was noted.   Moderate size mild intensity inferior defect most intense in the resting images with normal wall motion consistent with diaphragmatic attenuation   Left ventricular function is normal. Nuclear stress  EF: 69 %. The left ventricular ejection fraction is hyperdynamic (>65%). End diastolic cavity size is  normal.    Assessment and Plan  1.  CAD with chronic stable angina -Off ASA since on eliquis for prior DVT - recent stress test was benign. - recent chest pains not exertional, somewhat atypical - extensive history of esophageal cancer with surgery, evidence of recurrence with ongoing workup. Talked about discussing thsi pain with his GI and oncologist.    2. Hypotension - likely due to difficulty with regular hydration due to his drinking issues after her esophageal surgery - bp much better today. Continue hydration.    3. Hyperlipidemia   - followed closely by the patient's PCP Dr. Nadara Mustard, has tried mulitple statin regimens but has been limited due to myalgias currently on crestor.   -continue crestor, request labs from pcp   3. DVT - followed by oncllogy, upper extremity related to his port - on eliquis per heme/onc, defer duration to them   Arnoldo Lenis, M.D.

## 2022-04-15 DIAGNOSIS — C155 Malignant neoplasm of lower third of esophagus: Secondary | ICD-10-CM | POA: Diagnosis not present

## 2022-04-16 DIAGNOSIS — M792 Neuralgia and neuritis, unspecified: Secondary | ICD-10-CM | POA: Diagnosis not present

## 2022-04-16 DIAGNOSIS — M546 Pain in thoracic spine: Secondary | ICD-10-CM | POA: Diagnosis not present

## 2022-04-16 DIAGNOSIS — M9902 Segmental and somatic dysfunction of thoracic region: Secondary | ICD-10-CM | POA: Diagnosis not present

## 2022-04-20 DIAGNOSIS — Z79899 Other long term (current) drug therapy: Secondary | ICD-10-CM | POA: Diagnosis not present

## 2022-04-20 DIAGNOSIS — I340 Nonrheumatic mitral (valve) insufficiency: Secondary | ICD-10-CM | POA: Diagnosis not present

## 2022-04-20 DIAGNOSIS — S29012A Strain of muscle and tendon of back wall of thorax, initial encounter: Secondary | ICD-10-CM | POA: Diagnosis not present

## 2022-04-20 DIAGNOSIS — Z09 Encounter for follow-up examination after completed treatment for conditions other than malignant neoplasm: Secondary | ICD-10-CM | POA: Diagnosis not present

## 2022-04-20 DIAGNOSIS — G620 Drug-induced polyneuropathy: Secondary | ICD-10-CM | POA: Diagnosis not present

## 2022-04-20 DIAGNOSIS — C159 Malignant neoplasm of esophagus, unspecified: Secondary | ICD-10-CM | POA: Diagnosis not present

## 2022-04-20 DIAGNOSIS — K219 Gastro-esophageal reflux disease without esophagitis: Secondary | ICD-10-CM | POA: Diagnosis not present

## 2022-04-20 DIAGNOSIS — M546 Pain in thoracic spine: Secondary | ICD-10-CM | POA: Diagnosis not present

## 2022-04-20 DIAGNOSIS — R918 Other nonspecific abnormal finding of lung field: Secondary | ICD-10-CM | POA: Diagnosis not present

## 2022-04-20 DIAGNOSIS — Z951 Presence of aortocoronary bypass graft: Secondary | ICD-10-CM | POA: Diagnosis not present

## 2022-04-20 DIAGNOSIS — Z0189 Encounter for other specified special examinations: Secondary | ICD-10-CM | POA: Diagnosis not present

## 2022-04-20 DIAGNOSIS — X58XXXA Exposure to other specified factors, initial encounter: Secondary | ICD-10-CM | POA: Diagnosis not present

## 2022-04-20 DIAGNOSIS — Z6831 Body mass index (BMI) 31.0-31.9, adult: Secondary | ICD-10-CM | POA: Diagnosis not present

## 2022-04-20 DIAGNOSIS — Z87891 Personal history of nicotine dependence: Secondary | ICD-10-CM | POA: Diagnosis not present

## 2022-04-20 DIAGNOSIS — I2510 Atherosclerotic heart disease of native coronary artery without angina pectoris: Secondary | ICD-10-CM | POA: Diagnosis not present

## 2022-04-20 DIAGNOSIS — C155 Malignant neoplasm of lower third of esophagus: Secondary | ICD-10-CM | POA: Diagnosis not present

## 2022-04-20 DIAGNOSIS — R030 Elevated blood-pressure reading, without diagnosis of hypertension: Secondary | ICD-10-CM | POA: Diagnosis not present

## 2022-04-20 DIAGNOSIS — E7800 Pure hypercholesterolemia, unspecified: Secondary | ICD-10-CM | POA: Diagnosis not present

## 2022-04-20 DIAGNOSIS — T451X5A Adverse effect of antineoplastic and immunosuppressive drugs, initial encounter: Secondary | ICD-10-CM | POA: Diagnosis not present

## 2022-04-27 DIAGNOSIS — Z5112 Encounter for antineoplastic immunotherapy: Secondary | ICD-10-CM | POA: Diagnosis not present

## 2022-04-27 DIAGNOSIS — C155 Malignant neoplasm of lower third of esophagus: Secondary | ICD-10-CM | POA: Diagnosis not present

## 2022-05-02 DIAGNOSIS — I2510 Atherosclerotic heart disease of native coronary artery without angina pectoris: Secondary | ICD-10-CM | POA: Diagnosis not present

## 2022-05-02 DIAGNOSIS — R918 Other nonspecific abnormal finding of lung field: Secondary | ICD-10-CM | POA: Diagnosis not present

## 2022-05-02 DIAGNOSIS — Z808 Family history of malignant neoplasm of other organs or systems: Secondary | ICD-10-CM | POA: Diagnosis not present

## 2022-05-02 DIAGNOSIS — M8458XA Pathological fracture in neoplastic disease, other specified site, initial encounter for fracture: Secondary | ICD-10-CM | POA: Diagnosis not present

## 2022-05-02 DIAGNOSIS — E7800 Pure hypercholesterolemia, unspecified: Secondary | ICD-10-CM | POA: Diagnosis not present

## 2022-05-02 DIAGNOSIS — C7951 Secondary malignant neoplasm of bone: Secondary | ICD-10-CM | POA: Diagnosis not present

## 2022-05-02 DIAGNOSIS — M8448XA Pathological fracture, other site, initial encounter for fracture: Secondary | ICD-10-CM | POA: Diagnosis not present

## 2022-05-02 DIAGNOSIS — C159 Malignant neoplasm of esophagus, unspecified: Secondary | ICD-10-CM | POA: Diagnosis not present

## 2022-05-02 DIAGNOSIS — R079 Chest pain, unspecified: Secondary | ICD-10-CM | POA: Diagnosis not present

## 2022-05-02 DIAGNOSIS — G893 Neoplasm related pain (acute) (chronic): Secondary | ICD-10-CM | POA: Diagnosis not present

## 2022-05-02 DIAGNOSIS — M546 Pain in thoracic spine: Secondary | ICD-10-CM | POA: Diagnosis not present

## 2022-05-02 DIAGNOSIS — R0781 Pleurodynia: Secondary | ICD-10-CM | POA: Diagnosis not present

## 2022-05-03 DIAGNOSIS — C155 Malignant neoplasm of lower third of esophagus: Secondary | ICD-10-CM | POA: Diagnosis not present

## 2022-05-03 DIAGNOSIS — Z95828 Presence of other vascular implants and grafts: Secondary | ICD-10-CM | POA: Diagnosis not present

## 2022-05-04 DIAGNOSIS — C7952 Secondary malignant neoplasm of bone marrow: Secondary | ICD-10-CM | POA: Diagnosis not present

## 2022-05-04 DIAGNOSIS — I700 Atherosclerosis of aorta: Secondary | ICD-10-CM | POA: Diagnosis not present

## 2022-05-04 DIAGNOSIS — I2510 Atherosclerotic heart disease of native coronary artery without angina pectoris: Secondary | ICD-10-CM | POA: Diagnosis not present

## 2022-05-04 DIAGNOSIS — R918 Other nonspecific abnormal finding of lung field: Secondary | ICD-10-CM | POA: Diagnosis not present

## 2022-05-04 DIAGNOSIS — C7951 Secondary malignant neoplasm of bone: Secondary | ICD-10-CM | POA: Diagnosis not present

## 2022-05-04 DIAGNOSIS — C155 Malignant neoplasm of lower third of esophagus: Secondary | ICD-10-CM | POA: Diagnosis not present

## 2022-05-04 DIAGNOSIS — R6881 Early satiety: Secondary | ICD-10-CM | POA: Diagnosis not present

## 2022-05-04 DIAGNOSIS — M8448XA Pathological fracture, other site, initial encounter for fracture: Secondary | ICD-10-CM | POA: Diagnosis not present

## 2022-05-04 DIAGNOSIS — Z510 Encounter for antineoplastic radiation therapy: Secondary | ICD-10-CM | POA: Diagnosis not present

## 2022-05-05 DIAGNOSIS — M8448XA Pathological fracture, other site, initial encounter for fracture: Secondary | ICD-10-CM | POA: Diagnosis not present

## 2022-05-05 DIAGNOSIS — R918 Other nonspecific abnormal finding of lung field: Secondary | ICD-10-CM | POA: Diagnosis not present

## 2022-05-05 DIAGNOSIS — Z510 Encounter for antineoplastic radiation therapy: Secondary | ICD-10-CM | POA: Diagnosis not present

## 2022-05-05 DIAGNOSIS — I2510 Atherosclerotic heart disease of native coronary artery without angina pectoris: Secondary | ICD-10-CM | POA: Diagnosis not present

## 2022-05-05 DIAGNOSIS — C155 Malignant neoplasm of lower third of esophagus: Secondary | ICD-10-CM | POA: Diagnosis not present

## 2022-05-05 DIAGNOSIS — C7952 Secondary malignant neoplasm of bone marrow: Secondary | ICD-10-CM | POA: Diagnosis not present

## 2022-05-05 DIAGNOSIS — R6881 Early satiety: Secondary | ICD-10-CM | POA: Diagnosis not present

## 2022-05-05 DIAGNOSIS — C7951 Secondary malignant neoplasm of bone: Secondary | ICD-10-CM | POA: Diagnosis not present

## 2022-05-05 DIAGNOSIS — I700 Atherosclerosis of aorta: Secondary | ICD-10-CM | POA: Diagnosis not present

## 2022-05-06 DIAGNOSIS — R6881 Early satiety: Secondary | ICD-10-CM | POA: Diagnosis not present

## 2022-05-06 DIAGNOSIS — C7951 Secondary malignant neoplasm of bone: Secondary | ICD-10-CM | POA: Diagnosis not present

## 2022-05-06 DIAGNOSIS — C155 Malignant neoplasm of lower third of esophagus: Secondary | ICD-10-CM | POA: Diagnosis not present

## 2022-05-06 DIAGNOSIS — K5900 Constipation, unspecified: Secondary | ICD-10-CM | POA: Diagnosis not present

## 2022-05-06 DIAGNOSIS — M8448XA Pathological fracture, other site, initial encounter for fracture: Secondary | ICD-10-CM | POA: Diagnosis not present

## 2022-05-06 DIAGNOSIS — R112 Nausea with vomiting, unspecified: Secondary | ICD-10-CM | POA: Diagnosis not present

## 2022-05-06 DIAGNOSIS — I700 Atherosclerosis of aorta: Secondary | ICD-10-CM | POA: Diagnosis not present

## 2022-05-06 DIAGNOSIS — I2510 Atherosclerotic heart disease of native coronary artery without angina pectoris: Secondary | ICD-10-CM | POA: Diagnosis not present

## 2022-05-06 DIAGNOSIS — R918 Other nonspecific abnormal finding of lung field: Secondary | ICD-10-CM | POA: Diagnosis not present

## 2022-05-06 DIAGNOSIS — Z79899 Other long term (current) drug therapy: Secondary | ICD-10-CM | POA: Diagnosis not present

## 2022-05-06 DIAGNOSIS — R109 Unspecified abdominal pain: Secondary | ICD-10-CM | POA: Diagnosis not present

## 2022-05-06 DIAGNOSIS — Z510 Encounter for antineoplastic radiation therapy: Secondary | ICD-10-CM | POA: Diagnosis not present

## 2022-05-06 DIAGNOSIS — C7952 Secondary malignant neoplasm of bone marrow: Secondary | ICD-10-CM | POA: Diagnosis not present

## 2022-05-06 DIAGNOSIS — Z87891 Personal history of nicotine dependence: Secondary | ICD-10-CM | POA: Diagnosis not present

## 2022-05-06 DIAGNOSIS — R1110 Vomiting, unspecified: Secondary | ICD-10-CM | POA: Diagnosis not present

## 2022-05-06 DIAGNOSIS — Z8501 Personal history of malignant neoplasm of esophagus: Secondary | ICD-10-CM | POA: Diagnosis not present

## 2022-05-06 DIAGNOSIS — E7800 Pure hypercholesterolemia, unspecified: Secondary | ICD-10-CM | POA: Diagnosis not present

## 2022-05-07 DIAGNOSIS — C155 Malignant neoplasm of lower third of esophagus: Secondary | ICD-10-CM | POA: Diagnosis not present

## 2022-05-07 DIAGNOSIS — D649 Anemia, unspecified: Secondary | ICD-10-CM | POA: Diagnosis not present

## 2022-05-07 DIAGNOSIS — Z9221 Personal history of antineoplastic chemotherapy: Secondary | ICD-10-CM | POA: Diagnosis not present

## 2022-05-07 DIAGNOSIS — G620 Drug-induced polyneuropathy: Secondary | ICD-10-CM | POA: Diagnosis not present

## 2022-05-07 DIAGNOSIS — I700 Atherosclerosis of aorta: Secondary | ICD-10-CM | POA: Diagnosis not present

## 2022-05-07 DIAGNOSIS — C7952 Secondary malignant neoplasm of bone marrow: Secondary | ICD-10-CM | POA: Diagnosis not present

## 2022-05-07 DIAGNOSIS — I7143 Infrarenal abdominal aortic aneurysm, without rupture: Secondary | ICD-10-CM | POA: Diagnosis not present

## 2022-05-07 DIAGNOSIS — C7951 Secondary malignant neoplasm of bone: Secondary | ICD-10-CM | POA: Diagnosis not present

## 2022-05-07 DIAGNOSIS — K5730 Diverticulosis of large intestine without perforation or abscess without bleeding: Secondary | ICD-10-CM | POA: Diagnosis not present

## 2022-05-10 DIAGNOSIS — I701 Atherosclerosis of renal artery: Secondary | ICD-10-CM | POA: Diagnosis not present

## 2022-05-10 DIAGNOSIS — C7951 Secondary malignant neoplasm of bone: Secondary | ICD-10-CM | POA: Diagnosis not present

## 2022-05-10 DIAGNOSIS — D649 Anemia, unspecified: Secondary | ICD-10-CM | POA: Diagnosis not present

## 2022-05-10 DIAGNOSIS — M48061 Spinal stenosis, lumbar region without neurogenic claudication: Secondary | ICD-10-CM | POA: Diagnosis not present

## 2022-05-10 DIAGNOSIS — G620 Drug-induced polyneuropathy: Secondary | ICD-10-CM | POA: Diagnosis not present

## 2022-05-10 DIAGNOSIS — K5903 Drug induced constipation: Secondary | ICD-10-CM | POA: Diagnosis not present

## 2022-05-10 DIAGNOSIS — I7143 Infrarenal abdominal aortic aneurysm, without rupture: Secondary | ICD-10-CM | POA: Diagnosis not present

## 2022-05-10 DIAGNOSIS — C155 Malignant neoplasm of lower third of esophagus: Secondary | ICD-10-CM | POA: Diagnosis not present

## 2022-05-10 DIAGNOSIS — K5730 Diverticulosis of large intestine without perforation or abscess without bleeding: Secondary | ICD-10-CM | POA: Diagnosis not present

## 2022-05-10 DIAGNOSIS — D62 Acute posthemorrhagic anemia: Secondary | ICD-10-CM | POA: Diagnosis not present

## 2022-05-10 DIAGNOSIS — G893 Neoplasm related pain (acute) (chronic): Secondary | ICD-10-CM | POA: Diagnosis not present

## 2022-05-10 DIAGNOSIS — I700 Atherosclerosis of aorta: Secondary | ICD-10-CM | POA: Diagnosis not present

## 2022-05-10 DIAGNOSIS — T402X5A Adverse effect of other opioids, initial encounter: Secondary | ICD-10-CM | POA: Diagnosis not present

## 2022-05-10 DIAGNOSIS — C7952 Secondary malignant neoplasm of bone marrow: Secondary | ICD-10-CM | POA: Diagnosis not present

## 2022-05-10 DIAGNOSIS — C159 Malignant neoplasm of esophagus, unspecified: Secondary | ICD-10-CM | POA: Diagnosis not present

## 2022-05-10 DIAGNOSIS — T451X5A Adverse effect of antineoplastic and immunosuppressive drugs, initial encounter: Secondary | ICD-10-CM | POA: Diagnosis not present

## 2022-05-10 DIAGNOSIS — Z9221 Personal history of antineoplastic chemotherapy: Secondary | ICD-10-CM | POA: Diagnosis not present

## 2022-05-11 DIAGNOSIS — I7143 Infrarenal abdominal aortic aneurysm, without rupture: Secondary | ICD-10-CM | POA: Diagnosis not present

## 2022-05-11 DIAGNOSIS — G620 Drug-induced polyneuropathy: Secondary | ICD-10-CM | POA: Diagnosis not present

## 2022-05-11 DIAGNOSIS — K5730 Diverticulosis of large intestine without perforation or abscess without bleeding: Secondary | ICD-10-CM | POA: Diagnosis not present

## 2022-05-11 DIAGNOSIS — Z9221 Personal history of antineoplastic chemotherapy: Secondary | ICD-10-CM | POA: Diagnosis not present

## 2022-05-11 DIAGNOSIS — C7952 Secondary malignant neoplasm of bone marrow: Secondary | ICD-10-CM | POA: Diagnosis not present

## 2022-05-11 DIAGNOSIS — C155 Malignant neoplasm of lower third of esophagus: Secondary | ICD-10-CM | POA: Diagnosis not present

## 2022-05-11 DIAGNOSIS — C7951 Secondary malignant neoplasm of bone: Secondary | ICD-10-CM | POA: Diagnosis not present

## 2022-05-11 DIAGNOSIS — I700 Atherosclerosis of aorta: Secondary | ICD-10-CM | POA: Diagnosis not present

## 2022-05-11 DIAGNOSIS — D649 Anemia, unspecified: Secondary | ICD-10-CM | POA: Diagnosis not present

## 2022-05-12 DIAGNOSIS — D649 Anemia, unspecified: Secondary | ICD-10-CM | POA: Diagnosis not present

## 2022-05-12 DIAGNOSIS — K5730 Diverticulosis of large intestine without perforation or abscess without bleeding: Secondary | ICD-10-CM | POA: Diagnosis not present

## 2022-05-12 DIAGNOSIS — I7143 Infrarenal abdominal aortic aneurysm, without rupture: Secondary | ICD-10-CM | POA: Diagnosis not present

## 2022-05-12 DIAGNOSIS — C7952 Secondary malignant neoplasm of bone marrow: Secondary | ICD-10-CM | POA: Diagnosis not present

## 2022-05-12 DIAGNOSIS — C155 Malignant neoplasm of lower third of esophagus: Secondary | ICD-10-CM | POA: Diagnosis not present

## 2022-05-12 DIAGNOSIS — I700 Atherosclerosis of aorta: Secondary | ICD-10-CM | POA: Diagnosis not present

## 2022-05-12 DIAGNOSIS — G620 Drug-induced polyneuropathy: Secondary | ICD-10-CM | POA: Diagnosis not present

## 2022-05-12 DIAGNOSIS — Z9221 Personal history of antineoplastic chemotherapy: Secondary | ICD-10-CM | POA: Diagnosis not present

## 2022-05-12 DIAGNOSIS — C7951 Secondary malignant neoplasm of bone: Secondary | ICD-10-CM | POA: Diagnosis not present

## 2022-05-13 DIAGNOSIS — K5900 Constipation, unspecified: Secondary | ICD-10-CM | POA: Diagnosis not present

## 2022-05-13 DIAGNOSIS — C7952 Secondary malignant neoplasm of bone marrow: Secondary | ICD-10-CM | POA: Diagnosis not present

## 2022-05-13 DIAGNOSIS — C7951 Secondary malignant neoplasm of bone: Secondary | ICD-10-CM | POA: Diagnosis not present

## 2022-05-13 DIAGNOSIS — C155 Malignant neoplasm of lower third of esophagus: Secondary | ICD-10-CM | POA: Diagnosis not present

## 2022-05-13 DIAGNOSIS — E7800 Pure hypercholesterolemia, unspecified: Secondary | ICD-10-CM | POA: Diagnosis not present

## 2022-05-13 DIAGNOSIS — I2510 Atherosclerotic heart disease of native coronary artery without angina pectoris: Secondary | ICD-10-CM | POA: Diagnosis not present

## 2022-05-13 DIAGNOSIS — Z79899 Other long term (current) drug therapy: Secondary | ICD-10-CM | POA: Diagnosis not present

## 2022-05-13 DIAGNOSIS — Z9221 Personal history of antineoplastic chemotherapy: Secondary | ICD-10-CM | POA: Diagnosis not present

## 2022-05-13 DIAGNOSIS — Z951 Presence of aortocoronary bypass graft: Secondary | ICD-10-CM | POA: Diagnosis not present

## 2022-05-13 DIAGNOSIS — D649 Anemia, unspecified: Secondary | ICD-10-CM | POA: Diagnosis not present

## 2022-05-13 DIAGNOSIS — I700 Atherosclerosis of aorta: Secondary | ICD-10-CM | POA: Diagnosis not present

## 2022-05-13 DIAGNOSIS — K5730 Diverticulosis of large intestine without perforation or abscess without bleeding: Secondary | ICD-10-CM | POA: Diagnosis not present

## 2022-05-13 DIAGNOSIS — Z7901 Long term (current) use of anticoagulants: Secondary | ICD-10-CM | POA: Diagnosis not present

## 2022-05-13 DIAGNOSIS — G620 Drug-induced polyneuropathy: Secondary | ICD-10-CM | POA: Diagnosis not present

## 2022-05-13 DIAGNOSIS — Z87891 Personal history of nicotine dependence: Secondary | ICD-10-CM | POA: Diagnosis not present

## 2022-05-13 DIAGNOSIS — M109 Gout, unspecified: Secondary | ICD-10-CM | POA: Diagnosis not present

## 2022-05-13 DIAGNOSIS — I7143 Infrarenal abdominal aortic aneurysm, without rupture: Secondary | ICD-10-CM | POA: Diagnosis not present

## 2022-05-14 DIAGNOSIS — C155 Malignant neoplasm of lower third of esophagus: Secondary | ICD-10-CM | POA: Diagnosis not present

## 2022-05-14 DIAGNOSIS — D649 Anemia, unspecified: Secondary | ICD-10-CM | POA: Diagnosis not present

## 2022-05-14 DIAGNOSIS — K5730 Diverticulosis of large intestine without perforation or abscess without bleeding: Secondary | ICD-10-CM | POA: Diagnosis not present

## 2022-05-14 DIAGNOSIS — G620 Drug-induced polyneuropathy: Secondary | ICD-10-CM | POA: Diagnosis not present

## 2022-05-14 DIAGNOSIS — I7143 Infrarenal abdominal aortic aneurysm, without rupture: Secondary | ICD-10-CM | POA: Diagnosis not present

## 2022-05-14 DIAGNOSIS — Z9221 Personal history of antineoplastic chemotherapy: Secondary | ICD-10-CM | POA: Diagnosis not present

## 2022-05-14 DIAGNOSIS — C7951 Secondary malignant neoplasm of bone: Secondary | ICD-10-CM | POA: Diagnosis not present

## 2022-05-14 DIAGNOSIS — I700 Atherosclerosis of aorta: Secondary | ICD-10-CM | POA: Diagnosis not present

## 2022-05-14 DIAGNOSIS — C7952 Secondary malignant neoplasm of bone marrow: Secondary | ICD-10-CM | POA: Diagnosis not present

## 2022-05-17 DIAGNOSIS — Z9221 Personal history of antineoplastic chemotherapy: Secondary | ICD-10-CM | POA: Diagnosis not present

## 2022-05-17 DIAGNOSIS — G620 Drug-induced polyneuropathy: Secondary | ICD-10-CM | POA: Diagnosis not present

## 2022-05-17 DIAGNOSIS — I700 Atherosclerosis of aorta: Secondary | ICD-10-CM | POA: Diagnosis not present

## 2022-05-17 DIAGNOSIS — D649 Anemia, unspecified: Secondary | ICD-10-CM | POA: Diagnosis not present

## 2022-05-17 DIAGNOSIS — I7143 Infrarenal abdominal aortic aneurysm, without rupture: Secondary | ICD-10-CM | POA: Diagnosis not present

## 2022-05-17 DIAGNOSIS — C155 Malignant neoplasm of lower third of esophagus: Secondary | ICD-10-CM | POA: Diagnosis not present

## 2022-05-17 DIAGNOSIS — K5730 Diverticulosis of large intestine without perforation or abscess without bleeding: Secondary | ICD-10-CM | POA: Diagnosis not present

## 2022-05-17 DIAGNOSIS — C7952 Secondary malignant neoplasm of bone marrow: Secondary | ICD-10-CM | POA: Diagnosis not present

## 2022-05-17 DIAGNOSIS — T451X5A Adverse effect of antineoplastic and immunosuppressive drugs, initial encounter: Secondary | ICD-10-CM | POA: Diagnosis not present

## 2022-05-17 DIAGNOSIS — C7951 Secondary malignant neoplasm of bone: Secondary | ICD-10-CM | POA: Diagnosis not present

## 2022-05-18 DIAGNOSIS — K5730 Diverticulosis of large intestine without perforation or abscess without bleeding: Secondary | ICD-10-CM | POA: Diagnosis not present

## 2022-05-18 DIAGNOSIS — D649 Anemia, unspecified: Secondary | ICD-10-CM | POA: Diagnosis not present

## 2022-05-18 DIAGNOSIS — G620 Drug-induced polyneuropathy: Secondary | ICD-10-CM | POA: Diagnosis not present

## 2022-05-18 DIAGNOSIS — C155 Malignant neoplasm of lower third of esophagus: Secondary | ICD-10-CM | POA: Diagnosis not present

## 2022-05-18 DIAGNOSIS — C7951 Secondary malignant neoplasm of bone: Secondary | ICD-10-CM | POA: Diagnosis not present

## 2022-05-18 DIAGNOSIS — C7952 Secondary malignant neoplasm of bone marrow: Secondary | ICD-10-CM | POA: Diagnosis not present

## 2022-05-18 DIAGNOSIS — I7143 Infrarenal abdominal aortic aneurysm, without rupture: Secondary | ICD-10-CM | POA: Diagnosis not present

## 2022-05-18 DIAGNOSIS — I700 Atherosclerosis of aorta: Secondary | ICD-10-CM | POA: Diagnosis not present

## 2022-05-18 DIAGNOSIS — Z9221 Personal history of antineoplastic chemotherapy: Secondary | ICD-10-CM | POA: Diagnosis not present

## 2022-05-19 DIAGNOSIS — Z9221 Personal history of antineoplastic chemotherapy: Secondary | ICD-10-CM | POA: Diagnosis not present

## 2022-05-19 DIAGNOSIS — C7951 Secondary malignant neoplasm of bone: Secondary | ICD-10-CM | POA: Diagnosis not present

## 2022-05-19 DIAGNOSIS — D649 Anemia, unspecified: Secondary | ICD-10-CM | POA: Diagnosis not present

## 2022-05-19 DIAGNOSIS — C7952 Secondary malignant neoplasm of bone marrow: Secondary | ICD-10-CM | POA: Diagnosis not present

## 2022-05-19 DIAGNOSIS — K5730 Diverticulosis of large intestine without perforation or abscess without bleeding: Secondary | ICD-10-CM | POA: Diagnosis not present

## 2022-05-19 DIAGNOSIS — G620 Drug-induced polyneuropathy: Secondary | ICD-10-CM | POA: Diagnosis not present

## 2022-05-19 DIAGNOSIS — I7143 Infrarenal abdominal aortic aneurysm, without rupture: Secondary | ICD-10-CM | POA: Diagnosis not present

## 2022-05-19 DIAGNOSIS — C155 Malignant neoplasm of lower third of esophagus: Secondary | ICD-10-CM | POA: Diagnosis not present

## 2022-05-19 DIAGNOSIS — I700 Atherosclerosis of aorta: Secondary | ICD-10-CM | POA: Diagnosis not present

## 2022-05-24 DIAGNOSIS — T451X5A Adverse effect of antineoplastic and immunosuppressive drugs, initial encounter: Secondary | ICD-10-CM | POA: Diagnosis not present

## 2022-05-24 DIAGNOSIS — C7951 Secondary malignant neoplasm of bone: Secondary | ICD-10-CM | POA: Diagnosis not present

## 2022-05-24 DIAGNOSIS — G620 Drug-induced polyneuropathy: Secondary | ICD-10-CM | POA: Diagnosis not present

## 2022-05-24 DIAGNOSIS — N401 Enlarged prostate with lower urinary tract symptoms: Secondary | ICD-10-CM | POA: Diagnosis not present

## 2022-05-24 DIAGNOSIS — Z95828 Presence of other vascular implants and grafts: Secondary | ICD-10-CM | POA: Diagnosis not present

## 2022-05-24 DIAGNOSIS — C159 Malignant neoplasm of esophagus, unspecified: Secondary | ICD-10-CM | POA: Diagnosis not present

## 2022-05-24 DIAGNOSIS — C155 Malignant neoplasm of lower third of esophagus: Secondary | ICD-10-CM | POA: Diagnosis not present

## 2022-05-24 DIAGNOSIS — I959 Hypotension, unspecified: Secondary | ICD-10-CM | POA: Diagnosis not present

## 2022-05-24 DIAGNOSIS — C7952 Secondary malignant neoplasm of bone marrow: Secondary | ICD-10-CM | POA: Diagnosis not present

## 2022-05-24 DIAGNOSIS — R3914 Feeling of incomplete bladder emptying: Secondary | ICD-10-CM | POA: Diagnosis not present

## 2022-05-25 DIAGNOSIS — L89899 Pressure ulcer of other site, unspecified stage: Secondary | ICD-10-CM | POA: Diagnosis not present

## 2022-05-25 DIAGNOSIS — D239 Other benign neoplasm of skin, unspecified: Secondary | ICD-10-CM | POA: Diagnosis not present

## 2022-05-26 DIAGNOSIS — C155 Malignant neoplasm of lower third of esophagus: Secondary | ICD-10-CM | POA: Diagnosis not present

## 2022-05-26 DIAGNOSIS — Z95828 Presence of other vascular implants and grafts: Secondary | ICD-10-CM | POA: Diagnosis not present

## 2022-05-28 DIAGNOSIS — T451X5A Adverse effect of antineoplastic and immunosuppressive drugs, initial encounter: Secondary | ICD-10-CM | POA: Diagnosis not present

## 2022-05-28 DIAGNOSIS — N401 Enlarged prostate with lower urinary tract symptoms: Secondary | ICD-10-CM | POA: Diagnosis not present

## 2022-05-28 DIAGNOSIS — C155 Malignant neoplasm of lower third of esophagus: Secondary | ICD-10-CM | POA: Diagnosis not present

## 2022-05-28 DIAGNOSIS — C7951 Secondary malignant neoplasm of bone: Secondary | ICD-10-CM | POA: Diagnosis not present

## 2022-05-28 DIAGNOSIS — C7952 Secondary malignant neoplasm of bone marrow: Secondary | ICD-10-CM | POA: Diagnosis not present

## 2022-05-28 DIAGNOSIS — G620 Drug-induced polyneuropathy: Secondary | ICD-10-CM | POA: Diagnosis not present

## 2022-05-28 DIAGNOSIS — Z95828 Presence of other vascular implants and grafts: Secondary | ICD-10-CM | POA: Diagnosis not present

## 2022-05-28 DIAGNOSIS — R3914 Feeling of incomplete bladder emptying: Secondary | ICD-10-CM | POA: Diagnosis not present

## 2022-06-03 DIAGNOSIS — Z20828 Contact with and (suspected) exposure to other viral communicable diseases: Secondary | ICD-10-CM | POA: Diagnosis not present

## 2022-06-03 DIAGNOSIS — C159 Malignant neoplasm of esophagus, unspecified: Secondary | ICD-10-CM | POA: Diagnosis not present

## 2022-06-03 DIAGNOSIS — J069 Acute upper respiratory infection, unspecified: Secondary | ICD-10-CM | POA: Diagnosis not present

## 2022-06-03 DIAGNOSIS — R059 Cough, unspecified: Secondary | ICD-10-CM | POA: Diagnosis not present

## 2022-06-03 DIAGNOSIS — R030 Elevated blood-pressure reading, without diagnosis of hypertension: Secondary | ICD-10-CM | POA: Diagnosis not present

## 2022-06-04 DIAGNOSIS — N401 Enlarged prostate with lower urinary tract symptoms: Secondary | ICD-10-CM | POA: Diagnosis not present

## 2022-06-04 DIAGNOSIS — C159 Malignant neoplasm of esophagus, unspecified: Secondary | ICD-10-CM | POA: Diagnosis not present

## 2022-06-04 DIAGNOSIS — J984 Other disorders of lung: Secondary | ICD-10-CM | POA: Diagnosis not present

## 2022-06-04 DIAGNOSIS — I959 Hypotension, unspecified: Secondary | ICD-10-CM | POA: Diagnosis not present

## 2022-06-04 DIAGNOSIS — C155 Malignant neoplasm of lower third of esophagus: Secondary | ICD-10-CM | POA: Diagnosis not present

## 2022-06-04 DIAGNOSIS — G620 Drug-induced polyneuropathy: Secondary | ICD-10-CM | POA: Diagnosis not present

## 2022-06-04 DIAGNOSIS — R3914 Feeling of incomplete bladder emptying: Secondary | ICD-10-CM | POA: Diagnosis not present

## 2022-06-04 DIAGNOSIS — C7951 Secondary malignant neoplasm of bone: Secondary | ICD-10-CM | POA: Diagnosis not present

## 2022-06-04 DIAGNOSIS — T451X5A Adverse effect of antineoplastic and immunosuppressive drugs, initial encounter: Secondary | ICD-10-CM | POA: Diagnosis not present

## 2022-06-04 DIAGNOSIS — I340 Nonrheumatic mitral (valve) insufficiency: Secondary | ICD-10-CM | POA: Diagnosis not present

## 2022-06-04 DIAGNOSIS — I77810 Thoracic aortic ectasia: Secondary | ICD-10-CM | POA: Diagnosis not present

## 2022-06-04 DIAGNOSIS — C7952 Secondary malignant neoplasm of bone marrow: Secondary | ICD-10-CM | POA: Diagnosis not present

## 2022-06-08 DIAGNOSIS — C155 Malignant neoplasm of lower third of esophagus: Secondary | ICD-10-CM | POA: Diagnosis not present

## 2022-06-08 DIAGNOSIS — Z95828 Presence of other vascular implants and grafts: Secondary | ICD-10-CM | POA: Diagnosis not present

## 2022-06-14 DIAGNOSIS — C7952 Secondary malignant neoplasm of bone marrow: Secondary | ICD-10-CM | POA: Diagnosis not present

## 2022-06-14 DIAGNOSIS — C155 Malignant neoplasm of lower third of esophagus: Secondary | ICD-10-CM | POA: Diagnosis not present

## 2022-06-14 DIAGNOSIS — C159 Malignant neoplasm of esophagus, unspecified: Secondary | ICD-10-CM | POA: Diagnosis not present

## 2022-06-14 DIAGNOSIS — C7951 Secondary malignant neoplasm of bone: Secondary | ICD-10-CM | POA: Diagnosis not present

## 2022-06-15 DIAGNOSIS — Z951 Presence of aortocoronary bypass graft: Secondary | ICD-10-CM | POA: Diagnosis not present

## 2022-06-15 DIAGNOSIS — R0602 Shortness of breath: Secondary | ICD-10-CM | POA: Diagnosis not present

## 2022-06-15 DIAGNOSIS — Z7901 Long term (current) use of anticoagulants: Secondary | ICD-10-CM | POA: Diagnosis not present

## 2022-06-15 DIAGNOSIS — I2510 Atherosclerotic heart disease of native coronary artery without angina pectoris: Secondary | ICD-10-CM | POA: Diagnosis not present

## 2022-06-15 DIAGNOSIS — I959 Hypotension, unspecified: Secondary | ICD-10-CM | POA: Diagnosis not present

## 2022-06-15 DIAGNOSIS — T50905A Adverse effect of unspecified drugs, medicaments and biological substances, initial encounter: Secondary | ICD-10-CM | POA: Diagnosis not present

## 2022-06-15 DIAGNOSIS — C155 Malignant neoplasm of lower third of esophagus: Secondary | ICD-10-CM | POA: Diagnosis not present

## 2022-06-15 DIAGNOSIS — Z8509 Personal history of malignant neoplasm of other digestive organs: Secondary | ICD-10-CM | POA: Diagnosis not present

## 2022-06-15 DIAGNOSIS — Z95828 Presence of other vascular implants and grafts: Secondary | ICD-10-CM | POA: Diagnosis not present

## 2022-06-15 DIAGNOSIS — R42 Dizziness and giddiness: Secondary | ICD-10-CM | POA: Diagnosis not present

## 2022-06-15 DIAGNOSIS — M109 Gout, unspecified: Secondary | ICD-10-CM | POA: Diagnosis not present

## 2022-06-15 DIAGNOSIS — K219 Gastro-esophageal reflux disease without esophagitis: Secondary | ICD-10-CM | POA: Diagnosis not present

## 2022-06-15 DIAGNOSIS — Z791 Long term (current) use of non-steroidal anti-inflammatories (NSAID): Secondary | ICD-10-CM | POA: Diagnosis not present

## 2022-06-17 ENCOUNTER — Encounter: Payer: Self-pay | Admitting: *Deleted

## 2022-06-17 DIAGNOSIS — C7951 Secondary malignant neoplasm of bone: Secondary | ICD-10-CM | POA: Diagnosis not present

## 2022-06-17 DIAGNOSIS — I2510 Atherosclerotic heart disease of native coronary artery without angina pectoris: Secondary | ICD-10-CM | POA: Diagnosis not present

## 2022-06-17 DIAGNOSIS — M8448XA Pathological fracture, other site, initial encounter for fracture: Secondary | ICD-10-CM | POA: Diagnosis not present

## 2022-06-17 DIAGNOSIS — C7952 Secondary malignant neoplasm of bone marrow: Secondary | ICD-10-CM | POA: Diagnosis not present

## 2022-06-17 DIAGNOSIS — I700 Atherosclerosis of aorta: Secondary | ICD-10-CM | POA: Diagnosis not present

## 2022-06-17 DIAGNOSIS — Z510 Encounter for antineoplastic radiation therapy: Secondary | ICD-10-CM | POA: Diagnosis not present

## 2022-06-17 DIAGNOSIS — C155 Malignant neoplasm of lower third of esophagus: Secondary | ICD-10-CM | POA: Diagnosis not present

## 2022-06-17 DIAGNOSIS — R918 Other nonspecific abnormal finding of lung field: Secondary | ICD-10-CM | POA: Diagnosis not present

## 2022-06-17 DIAGNOSIS — R6881 Early satiety: Secondary | ICD-10-CM | POA: Diagnosis not present

## 2022-06-18 ENCOUNTER — Ambulatory Visit: Payer: Medicare PPO | Attending: Cardiology | Admitting: Nurse Practitioner

## 2022-06-18 ENCOUNTER — Ambulatory Visit: Payer: Medicare PPO | Admitting: Cardiology

## 2022-06-18 ENCOUNTER — Encounter: Payer: Self-pay | Admitting: Nurse Practitioner

## 2022-06-18 ENCOUNTER — Telehealth: Payer: Self-pay | Admitting: Nurse Practitioner

## 2022-06-18 ENCOUNTER — Encounter (HOSPITAL_COMMUNITY): Payer: Self-pay

## 2022-06-18 ENCOUNTER — Emergency Department (HOSPITAL_COMMUNITY)
Admission: EM | Admit: 2022-06-18 | Discharge: 2022-06-18 | Disposition: A | Discharge status: Home / Self Care | Payer: Medicare PPO | Attending: Emergency Medicine | Admitting: Emergency Medicine

## 2022-06-18 ENCOUNTER — Other Ambulatory Visit: Payer: Self-pay

## 2022-06-18 ENCOUNTER — Emergency Department (HOSPITAL_COMMUNITY): Payer: Medicare PPO

## 2022-06-18 VITALS — BP 78/39 | HR 120 | Ht 69.5 in | Wt 189.4 lb

## 2022-06-18 DIAGNOSIS — R531 Weakness: Secondary | ICD-10-CM | POA: Diagnosis present

## 2022-06-18 DIAGNOSIS — I714 Abdominal aortic aneurysm, without rupture, unspecified: Secondary | ICD-10-CM | POA: Diagnosis not present

## 2022-06-18 DIAGNOSIS — Z8501 Personal history of malignant neoplasm of esophagus: Secondary | ICD-10-CM | POA: Diagnosis not present

## 2022-06-18 DIAGNOSIS — I959 Hypotension, unspecified: Secondary | ICD-10-CM | POA: Diagnosis not present

## 2022-06-18 DIAGNOSIS — D649 Anemia, unspecified: Secondary | ICD-10-CM | POA: Insufficient documentation

## 2022-06-18 DIAGNOSIS — E860 Dehydration: Secondary | ICD-10-CM | POA: Insufficient documentation

## 2022-06-18 DIAGNOSIS — R5381 Other malaise: Secondary | ICD-10-CM | POA: Diagnosis not present

## 2022-06-18 DIAGNOSIS — I2510 Atherosclerotic heart disease of native coronary artery without angina pectoris: Secondary | ICD-10-CM | POA: Diagnosis not present

## 2022-06-18 DIAGNOSIS — R5383 Other fatigue: Secondary | ICD-10-CM

## 2022-06-18 DIAGNOSIS — Z7901 Long term (current) use of anticoagulants: Secondary | ICD-10-CM | POA: Diagnosis not present

## 2022-06-18 LAB — CBC WITH DIFFERENTIAL/PLATELET
Abs Immature Granulocytes: 0.02 10*3/uL (ref 0.00–0.07)
Basophils Absolute: 0 10*3/uL (ref 0.0–0.1)
Basophils Relative: 1 %
Eosinophils Absolute: 0 10*3/uL (ref 0.0–0.5)
Eosinophils Relative: 0 %
HCT: 27.5 % — ABNORMAL LOW (ref 39.0–52.0)
Hemoglobin: 8.8 g/dL — ABNORMAL LOW (ref 13.0–17.0)
Immature Granulocytes: 1 %
Lymphocytes Relative: 8 %
Lymphs Abs: 0.3 10*3/uL — ABNORMAL LOW (ref 0.7–4.0)
MCH: 26.3 pg (ref 26.0–34.0)
MCHC: 32 g/dL (ref 30.0–36.0)
MCV: 82.3 fL (ref 80.0–100.0)
Monocytes Absolute: 0.5 10*3/uL (ref 0.1–1.0)
Monocytes Relative: 12 %
Neutro Abs: 3.1 10*3/uL (ref 1.7–7.7)
Neutrophils Relative %: 78 %
Platelets: 153 10*3/uL (ref 150–400)
RBC: 3.34 MIL/uL — ABNORMAL LOW (ref 4.22–5.81)
RDW: 27.5 % — ABNORMAL HIGH (ref 11.5–15.5)
WBC: 4 10*3/uL (ref 4.0–10.5)
nRBC: 0.8 % — ABNORMAL HIGH (ref 0.0–0.2)

## 2022-06-18 LAB — COMPREHENSIVE METABOLIC PANEL
ALT: 41 U/L (ref 0–44)
AST: 28 U/L (ref 15–41)
Albumin: 2.7 g/dL — ABNORMAL LOW (ref 3.5–5.0)
Alkaline Phosphatase: 77 U/L (ref 38–126)
Anion gap: 8 (ref 5–15)
BUN: 11 mg/dL (ref 8–23)
CO2: 25 mmol/L (ref 22–32)
Calcium: 8.2 mg/dL — ABNORMAL LOW (ref 8.9–10.3)
Chloride: 103 mmol/L (ref 98–111)
Creatinine, Ser: 0.71 mg/dL (ref 0.61–1.24)
GFR, Estimated: >60 mL/min (ref >60)
Glucose, Bld: 84 mg/dL (ref 70–99)
Potassium: 3.6 mmol/L (ref 3.5–5.1)
Sodium: 136 mmol/L (ref 135–145)
Total Bilirubin: 0.2 mg/dL — ABNORMAL LOW (ref 0.3–1.2)
Total Protein: 5.7 g/dL — ABNORMAL LOW (ref 6.5–8.1)

## 2022-06-18 MED ORDER — SODIUM CHLORIDE 0.9 % IV BOLUS
1000.0000 mL | Freq: Once | INTRAVENOUS | Status: AC
  Administered 2022-06-18: 1000 mL via INTRAVENOUS

## 2022-06-18 MED ORDER — MIDODRINE HCL 2.5 MG PO TABS: 2.5000 mg | ORAL_TABLET | Freq: Three times a day (TID) | ORAL | 3 refills | Status: DC | PRN

## 2022-06-18 MED ORDER — HEPARIN SOD (PORK) LOCK FLUSH 100 UNIT/ML IV SOLN
500.0000 [IU] | Freq: Once | INTRAVENOUS | Status: AC
  Administered 2022-06-18: 500 [IU]
  Filled 2022-06-18: qty 5

## 2022-06-18 NOTE — Telephone Encounter (Signed)
Received request to speak with patient's wife today. Called and s/w patient's wife that patient was being discharged from [place] ED today after blood work and receiving IV fluids. Discussed that we will begin midodrine 2.5 mg TID PRN for BP < 90/60. She verbalized understanding. Gave verbal orders to my CMA Christella Scheuermann) who is sending medication to patient's pharmacy and I will see patient back for follow-up in 2 weeks.   Finis Bud, NP

## 2022-06-18 NOTE — Addendum Note (Signed)
Addended by: Berlinda Last on: 06/18/2022 04:27 PM   Modules accepted: Orders

## 2022-06-18 NOTE — ED Triage Notes (Signed)
Pt came per request of [facility] for hypotension. Pt is being seen at [facility]. Pt is dizzy and feels bad.

## 2022-06-18 NOTE — Progress Notes (Signed)
Cardiology Office Note:    Date:  06/18/2022   ID:  Adrian Gibson, DOB 12-Sep-1950, MRN [id number]  PCP:  Lavella Lemons, [place] [facility] Cardiologist:  Carlyle Dolly, MD     Referring MD:  N[place], MD   CC: Here for EKG changes seen on recent EKG  History of Present Illness:    Adrian Gibson is a 72 y.o. male with a hx of the following:  CAD, s/p CABG x 4 in 2011 Hypotension HLD Dysphagia/esophageal cancer Hx of DVT AAA   Patient is a very pleasant 72 year old male with past medical history as mentioned above.  Underwent four-vessel CABG at [facility] in 2011, normal EF at that time.  Exercise MPI the following month did not reveal any perfusion defects.  Repeat NST in 2017 was negative for ischemia.  Echocardiogram in January 2023 showed normal EF, mild MR.  Nuclear stress test in March 2023 was negative for ischemia.  Has been followed by GI and oncology for history of dysphagia and esophageal cancer.  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  He is status post esophagectomy in March 2022.  Recent CT scan was concerning for new lesion on esophagus, patient reported recurrent cancer.  Also followed by heme/onc for history of DVT that developed around Port-A-Cath.  Last seen by Dr. Carlyle Dolly on April 13, 2022.  Noted some mild DOE/chest tightness when walking up a hill.  Some orthostatic symptoms with occasional low blood pressure at times.  He was working to increase his electrolytes.  Dr. Carlyle Dolly suggested he needs abdominal ultrasound in 2026 to reevaluate AAA.  Was told to follow-up in 6 months.  Our office was recently contacted for evaluation of recent EKG changes seen at [facility]. I have evaluated this EKG and consulted Dr. Carlyle Dolly who agrees that EKG is unremarkable, NSR, 86 bpm, with nonspecific ST segment changes, no acute ischemic changes. CC is weakness, low BP, and fatigue.  Patient states he doesn't feel good.  Wife states they were recommended to be seen by cardiology to make sure there is nothing wrong with his heart.  While getting chemo treatments, his blood pressure drops, he becomes lightheaded, and then receives IV fluids at [facility].  According to wife's report, they were seen at the ED at [facility] on June 15, 2022 and overall workup was negative, he received IV fluids, and was sent home.  Patient denies any chest pain, says breathing is shallow when his blood pressure is down.  Does have dizziness often, which is attributed to a side effect of the chemo medication and low BP.  According to wife's report, blood pressure is labile and is mainly hypotensive with max SBP at 112.  Denies any palpitations, syncope, orthopnea, PND, swelling, acute bleeding, or claudication.  He has lost 24 pounds since November.  Denies any other questions or concerns today.  Past Medical History:  Diagnosis Date   CAD (coronary artery disease)    multivessel/ left main CAD.[facility] Four-vessel CABG 8/11... Normal left ventricular funciont   Cancer ([place]) 2020   esophageal   Carpal tunnel syndrome    Dyslipidemia    Fusion of spine    Cervical vertebra fusion C5 C6 C7   GERD (gastroesophageal reflux disease)    Nasal polyps 2000   with rhinoplasty   Sleep apnea     Past Surgical History:  Procedure Laterality Date   BIOPSY  09/19/2018   Procedure: BIOPSY;  Surgeon: Daneil Dolin, MD;  Location: [facility];  Service: Endoscopy;;  esophageal   CARPAL TUNNEL RELEASE     left   CATARACT EXTRACTION     Bilateral   CERVICAL LAMINECTOMY     and diskectomy   CORONARY ARTERY BYPASS GRAFT     X 4   ESOPHAGOGASTRODUODENOSCOPY N/A 09/19/2018   Procedure: ESOPHAGOGASTRODUODENOSCOPY (EGD);  Surgeon: Daneil Dolin, MD;  Location: [facility];  Service: Endoscopy;  Laterality: N/A;  8:45am - office spoke with patient   RHINOPLASTY     Nasal   Ulnar Nerve Release from  Left      Current Medications: Current Meds  Medication Sig   allopurinol (ZYLOPRIM) 100 MG tablet Take 100 mg by mouth at bedtime.    apixaban (ELIQUIS) 5 MG TABS tablet Take 5 mg by mouth 2 (two) times daily.   Carboxymethylcellulose Sodium 0.25 % SOLN Apply 1 drop to eye 2 (two) times daily as needed (dry/irritated eyes.).   fluticasone (FLONASE) 50 MCG/ACT nasal spray Place 2 sprays into the nose daily as needed (congestion).   Multiple Vitamin (MULTIVITAMIN WITH MINERALS) TABS tablet Take 1 tablet by mouth daily.   Multiple Vitamins-Minerals (ZINC PO) Take 1 tablet by mouth daily.   nitroGLYCERIN (NITROSTAT) 0.4 MG SL tablet TAKE 1 TABLET BY MOUTH UNDER TONGUE EVERY 5 MINUTES AS NEEDED (Patient taking differently: Place 0.4 mg under the tongue every 5 (five) minutes as needed for chest pain.)   pantoprazole (PROTONIX) 40 MG tablet Take 1 tablet (40 mg total) by mouth daily. 30 minutes before breakfast   rosuvastatin (CRESTOR) 20 MG tablet Take 20 mg by mouth every evening.    sucralfate (CARAFATE) 1 GM/10ML suspension Take by mouth as needed.     Allergies:   Prochlorperazine, Prochlorperazine edisylate, and Atorvastatin   Social History   Socioeconomic History   Marital status: Married    Spouse name: Not on file   Number of children: Not on file   Years of education: Not on file   Highest education level: Not on file  Occupational History   Occupation: Retired [place]: [employer] [phone]  Tobacco Use   Smoking status: Former    Types: Cigars   Smokeless tobacco: Former    Types: Chew   Tobacco comments:    He has a remote history of smoking cigars, but he quit doing this several years ago.  Vaping Use   Vaping Use: Never used  Substance and Sexual Activity   Alcohol use: Not Currently    Alcohol/week: 0.0 standard drinks of alcohol    Comment: Occasional beer   Drug use: Never   Sexual activity: Not on file  Other Topics  Concern   Not on file  Social History Narrative   The patient lives in [place] with his wife.   Enjoys woodworking and fishing.   Has not been limited physically.   Social Determinants of Health   Financial Resource Strain: Not on file  Food Insecurity: Not on file  Transportation Needs: Not on file  Physical Activity: Not on file  Stress: Not on file  Social Connections: Not on file     Family History: The patient's family history includes Heart attack (age of onset: 35) in his brother; Heart attack (age of onset: 56) in his father. There is no history of Colon cancer or Colon polyps.  ROS:   Review of Systems  Constitutional:  Positive for malaise/fatigue and weight loss. Negative for chills, diaphoresis and fever.  HENT: Negative.    Eyes: Negative.   Respiratory:  Positive for shortness of breath. Negative for cough, hemoptysis, sputum production and wheezing.   Cardiovascular: Negative.   Gastrointestinal: Negative.   Genitourinary: Negative.   Musculoskeletal: Negative.   Skin: Negative.   Neurological:  Positive for dizziness and weakness. Negative for tingling, tremors, sensory change, speech change, focal weakness, seizures, loss of consciousness and headaches.  Psychiatric/Behavioral: Negative.      Please see the history of present illness.    All other systems reviewed and are negative.  EKGs/Labs/Other Studies Reviewed:    The following studies were reviewed today:   EKG:  EKG is not ordered today.  Recent EKG at [facility] reviewed and revealed NSR with nonspecific ST segment changes.    Myoview on 08/21/2021:   The study is normal. The study is low risk.   No ST deviation was noted.   Moderate size mild intensity inferior defect most intense in the resting images with normal wall motion consistent with diaphragmatic attenuation   Left ventricular function is normal. Nuclear stress EF: 69 %. The left ventricular ejection fraction is hyperdynamic (>65%).  End diastolic cavity size is normal.  Vascular US Aorta Medicare Screen on 03/17/2017: Final Interpretation:  Abdominal Aorta: No evidence of an abdominal aortic aneurysm was  visualized. The largest aortic measurement is 2.5 cm.  IVC is patent.  Recent Labs: 06/18/2022: ALT 41; BUN 11; Creatinine, Ser 0.71; Hemoglobin 8.8; Platelets 153; Potassium 3.6; Sodium 136  Recent Lipid Panel    Component Value Date/Time   CHOL (H) 01/07/2010 0436    278        ATP III CLASSIFICATION:  <200     mg/dL   Desirable  200-239  mg/dL   Borderline High  >=240    mg/dL   High          TRIG 241 (H) 01/07/2010 0436   HDL 34 (L) 01/07/2010 0436   CHOLHDL 8.2 01/07/2010 0436   VLDL 48 (H) 01/07/2010 0436   LDLCALC (H) 01/07/2010 0436    196        Total Cholesterol/HDL:CHD Risk Coronary Heart Disease Risk Table                     Men   Women  1/2 Average Risk   3.4   3.3  Average Risk       5.0   4.4  2 X Average Risk   9.6   7.1  3 X Average Risk  23.4   11.0        Use the calculated Patient Ratio above and the CHD Risk Table to determine the patient's CHD Risk.        ATP III CLASSIFICATION (LDL):  <100     mg/dL   Optimal  100-129  mg/dL   Near or Above                    Optimal  130-159  mg/dL   Borderline  160-189  mg/dL   High  >190     mg/dL   Very High    Physical Exam:    VS:  BP (!) 78/39 (BP Location: Right Arm, Patient Position: Sitting, Cuff Size: Normal)   Pulse (!) 120   Ht 5' 9.5" (1.765 m)   Wt 189 lb 6.4 oz (85.9 kg)  SpO2 100%   BMI 27.57 kg/m     Wt Readings from Last 3 Encounters:  06/18/22 189 lb 6.4 oz (85.9 kg)  06/18/22 189 lb 6.4 oz (85.9 kg)  04/13/22 213 lb 6.4 oz (96.8 kg)     GEN: Thin, 72 y.o. male, appears ill HEENT: Normal NECK: No JVD; No carotid bruits CARDIAC: S1/S2, RRR, no murmurs, rubs, gallops; 2+ pulses RESPIRATORY:  Diminished with rales noted to posterior left upper lobe to auscultation without wheezing or rhonchi, shallow  breathing MUSCULOSKELETAL:  No edema; No deformity  SKIN: Pale, cool and dry NEUROLOGIC:  Alert and oriented x 3 PSYCHIATRIC:  Flat affect   ASSESSMENT:    1. Hypotension, unspecified hypotension type   2. Malignant neoplasm of esophagus, unspecified location ([place])   3. Coronary artery disease involving native heart without angina pectoris, unspecified vessel or lesion type   4. Abdominal aortic aneurysm (AAA) without rupture, unspecified part ([place])   5. Malaise and fatigue    PLAN:    In order of problems listed above:  Hypotension Esophageal Cancer CAD, s/p CABG AAA Malaise and fatigue  Patient is a 72 year old male with past medical history of coronary artery disease, s/p CABG in 2011, hyperlipidemia, AAA, HLD, history of DVT, dysphagia/esophageal cancer, and hypotension.  He presents to the office today at the request of his oncology team at [facility] for "EKG changes."  I looked at his EKG today along with patient's cardiologist Dr. Carlyle Dolly I do not see any concern seen on EKG, it showed normal sinus rhythm with nonspecific ST abnormality, without any acute ischemic changes noted.  CC by patient today is weakness, low blood pressure, dizziness, and shallow breathing.  Stated he is scheduled to have IV fluids at [facility] later today.  Denies any chest pain or any other acute cardiac complaints or issue.  On exam, he appears to not be feeling well.  He is pale on appearance and thin, has lost 24 pounds since November.  Blood pressure on arrival is 70/44, repeat manual was 78/39.  Shallow breathing with diminished breath sounds to auscultation with rales noted to posterior left upper lobe, no wheezing or rhonchi on exam.  Does not appear to be volume overloaded.  Rest of PE as mentioned above.  Discussed outpatient versus inpatient management.  Per guidelines, patient needs to be seen at the emergency department.  Both patient and wife agreed and verbalized understanding.   Recommended EMS transportation, however patient declined and stated wife will take him to the emergency department.  Discussed case with charge RN and ED physician on-call, gave report, and they both verbalized understanding.  Case was d/w patient's cardiologist, Dr. Carlyle Dolly, who also agreed that patient needs to be seen in the ED.  Upon arrival to the ED, I recommend the following be obtained: 1.  Vital signs and telemetry monitoring/twelve-lead EKG 2.  Lab work including: CBC with differential, CMET, magnesium, thyroid panel, lactate, D-dimer, COVID/flu/RSV test.  3.  Radiological studies including the following: 2 view chest x-ray, CT scan of chest to rule out PE/dissection/acute abnormality 4.  Admit to hospitalist services/ICU for IV fluids/pressors as indicated.  Transition to low dose midodrine once in stable condition at d/c 5.  Consult oncology 6.  Consult palliative care/hospice services, discuss goals of care while inpatient and arrange outpatient follow-up with palliative/hospice services 7.  Discharge when in stable condition and follow up in 1-2 weeks post discharge.      Medication Adjustments/Labs  and Tests Ordered: Current medicines are reviewed at length with the patient today.  Concerns regarding medicines are outlined above.  No orders of the defined types were placed in this encounter.  No orders of the defined types were placed in this encounter.   Patient Instructions  Medication Instructions:  Your physician recommends that you continue on your current medications as directed. Please refer to the Current Medication list given to you today.   Labwork: None  Testing/Procedures: None  Follow-Up: Follow up with Finis Bud, NP in 1-2 weeks.  Follow up with your Primary Care Provider as soon as possible.   Any Other Special Instructions Will Be Listed Below (If Applicable).     If you need a refill on your cardiac medications before your next  appointment, please call your pharmacy.    SignedFinis Bud, NP  06/18/2022 12:35 PM    [place]

## 2022-06-18 NOTE — Discharge Instructions (Signed)
Follow-up with your oncologist at the beginning of the week.  Let them know that your low blood pressure resolved with IV fluids and the cardiologist feels like your heart is fine

## 2022-06-18 NOTE — Addendum Note (Signed)
Addended by: Berlinda Last on: 06/18/2022 04:21 PM   Modules accepted: Orders

## 2022-06-18 NOTE — Telephone Encounter (Signed)
Patient's wife called to talk with NP Finis Bud. Had appt this morning. Said that phone call was very important concerning patient

## 2022-06-18 NOTE — ED Provider Notes (Signed)
[facility] Provider Note   CSN: [id number] Arrival date & time: 06/18/22  0930     History {Add pertinent medical, surgical, social history, OB history to HPI:1} Chief Complaint  Patient presents with   Hypotension    Adrian Gibson is a 72 y.o. male.  Patient has esophageal cancer and has episodes of hypotension.   Weakness      Home Medications Prior to Admission medications   Medication Sig Start Date End Date Taking? Authorizing Provider  allopurinol (ZYLOPRIM) 100 MG tablet Take 100 mg by mouth at bedtime.  04/06/13   [provider]  apixaban (ELIQUIS) 5 MG TABS tablet Take 5 mg by mouth 2 (two) times daily.    [provider]  Carboxymethylcellulose Sodium 0.25 % SOLN Apply 1 drop to eye 2 (two) times daily as needed (dry/irritated eyes.).    [provider]  Cholecalciferol (VITAMIN D3 PO) Take 1 tablet by mouth daily. Patient not taking: Reported on 06/18/2022    [provider]  fluticasone (FLONASE) 50 MCG/ACT nasal spray Place 2 sprays into the nose daily as needed (congestion).    [provider]  Multiple Vitamin (MULTIVITAMIN WITH MINERALS) TABS tablet Take 1 tablet by mouth daily.    [provider]  Multiple Vitamins-Minerals (ZINC PO) Take 1 tablet by mouth daily.    [provider]  nitroGLYCERIN (NITROSTAT) 0.4 MG SL tablet TAKE 1 TABLET BY MOUTH UNDER TONGUE EVERY 5 MINUTES AS NEEDED Patient taking differently: Place 0.4 mg under the tongue every 5 (five) minutes as needed for chest pain. 03/31/16   Arnoldo Lenis, MD  pantoprazole (PROTONIX) 40 MG tablet Take 1 tablet (40 mg total) by mouth daily. 30 minutes before breakfast 09/07/18   Annitta Needs, NP  pregabalin (LYRICA) 100 MG capsule Take 1 capsule by mouth 2 (two) times daily.    [provider]  rosuvastatin (CRESTOR) 20 MG tablet Take 20 mg by mouth every evening.     [provider]   sucralfate (CARAFATE) 1 GM/10ML suspension Take by mouth as needed. 06/16/21 06/18/22  [provider]      Allergies    Prochlorperazine, Prochlorperazine edisylate, and Atorvastatin    Review of Systems   Review of Systems  Neurological:  Positive for weakness.    Physical Exam Updated Vital Signs BP 121/71   Pulse 64   Temp 97.9 F (36.6 C) (Oral)   Resp 13   Ht 5' 9.5" (1.765 m)   Wt 85.9 kg   SpO2 100%   BMI 27.57 kg/m  Physical Exam  ED Results / Procedures / Treatments   Labs (all labs ordered are listed, but only abnormal results are displayed) Labs Reviewed  CBC WITH DIFFERENTIAL/PLATELET - Abnormal; Notable for the following components:      Result Value   RBC 3.34 (*)    Hemoglobin 8.8 (*)    HCT 27.5 (*)    RDW 27.5 (*)    nRBC 0.8 (*)    Lymphs Abs 0.3 (*)    All other components within normal limits  COMPREHENSIVE METABOLIC PANEL - Abnormal; Notable for the following components:   Calcium 8.2 (*)    Total Protein 5.7 (*)    Albumin 2.7 (*)    Total Bilirubin 0.2 (*)    All other components within normal limits    EKG None  Radiology DG Chest Port 1 View  Result Date: 06/18/2022 CLINICAL DATA:  Hypotension EXAM: PORTABLE  CHEST 1 VIEW COMPARISON:  06/15/2022 FINDINGS: Cardiac silhouette appears prominent. No pneumonia or pulmonary edema. No pneumothorax or pleural effusion. Patient is status post median sternotomy and CABG. Right-sided Port-A-Cath tip distal SVC. IMPRESSION: Enlarged cardiac silhouette. Electronically Signed   By: Sammie Bench M.D.   On: 06/18/2022 11:17    Procedures Procedures  {Document cardiac monitor, telemetry assessment procedure when appropriate:1}  Medications Ordered in ED Medications  sodium chloride 0.9 % bolus 1,000 mL (0 mLs Intravenous Stopped 06/18/22 1245)  sodium chloride 0.9 % bolus 1,000 mL (1,000 mLs Intravenous New Bag/Given 06/18/22 1412)    ED Course/ Medical Decision Making/ A&P   {    Click here for ABCD2, HEART and other calculatorsREFRESH Note before signing :1}                          Medical Decision Making Amount and/or Complexity of Data Reviewed Labs: ordered. Radiology: ordered.   Patient with dehydration and hypotension along with anemia.  He responded to IV fluids will follow-up with oncologist next week {Document critical care time when appropriate:1} {Document review of labs and clinical decision tools ie heart score, Chads2Vasc2 etc:1}  {Document your independent review of radiology images, and any outside records:1} {Document your discussion with family members, caretakers, and with consultants:1} {Document social determinants of health affecting pt's care:1} {Document your decision making why or why not admission, treatments were needed:1} Final Clinical Impression(s) / ED Diagnoses Final diagnoses:  Dehydration  Anemia, unspecified type    Rx / DC Orders ED Discharge Orders     None

## 2022-06-18 NOTE — Patient Instructions (Signed)
Medication Instructions:  Your physician recommends that you continue on your current medications as directed. Please refer to the Current Medication list given to you today.   Labwork: None  Testing/Procedures: None  Follow-Up: Follow up with Finis Bud, NP in 1-2 weeks.  Follow up with your Primary Care Provider as soon as possible.   Any Other Special Instructions Will Be Listed Below (If Applicable).     If you need a refill on your cardiac medications before your next appointment, please call your pharmacy.

## 2022-06-28 DIAGNOSIS — C7951 Secondary malignant neoplasm of bone: Secondary | ICD-10-CM | POA: Diagnosis not present

## 2022-06-28 DIAGNOSIS — C155 Malignant neoplasm of lower third of esophagus: Secondary | ICD-10-CM | POA: Diagnosis not present

## 2022-06-28 DIAGNOSIS — C159 Malignant neoplasm of esophagus, unspecified: Secondary | ICD-10-CM | POA: Diagnosis not present

## 2022-06-28 DIAGNOSIS — C7952 Secondary malignant neoplasm of bone marrow: Secondary | ICD-10-CM | POA: Diagnosis not present

## 2022-06-29 DIAGNOSIS — Z5111 Encounter for antineoplastic chemotherapy: Secondary | ICD-10-CM | POA: Diagnosis not present

## 2022-06-29 DIAGNOSIS — C155 Malignant neoplasm of lower third of esophagus: Secondary | ICD-10-CM | POA: Diagnosis not present

## 2022-06-29 DIAGNOSIS — Z95828 Presence of other vascular implants and grafts: Secondary | ICD-10-CM | POA: Diagnosis not present

## 2022-07-02 ENCOUNTER — Encounter: Payer: Self-pay | Admitting: Nurse Practitioner

## 2022-07-02 ENCOUNTER — Encounter: Payer: Self-pay | Admitting: *Deleted

## 2022-07-02 ENCOUNTER — Ambulatory Visit: Payer: Medicare PPO | Attending: Nurse Practitioner | Admitting: Nurse Practitioner

## 2022-07-02 VITALS — BP 118/70 | HR 64 | Ht 69.5 in | Wt 197.8 lb

## 2022-07-02 DIAGNOSIS — Z8501 Personal history of malignant neoplasm of esophagus: Secondary | ICD-10-CM

## 2022-07-02 DIAGNOSIS — Z86718 Personal history of other venous thrombosis and embolism: Secondary | ICD-10-CM

## 2022-07-02 DIAGNOSIS — E782 Mixed hyperlipidemia: Secondary | ICD-10-CM | POA: Diagnosis not present

## 2022-07-02 DIAGNOSIS — I714 Abdominal aortic aneurysm, without rupture, unspecified: Secondary | ICD-10-CM | POA: Diagnosis not present

## 2022-07-02 DIAGNOSIS — I959 Hypotension, unspecified: Secondary | ICD-10-CM

## 2022-07-02 DIAGNOSIS — I2510 Atherosclerotic heart disease of native coronary artery without angina pectoris: Secondary | ICD-10-CM | POA: Diagnosis not present

## 2022-07-02 MED ORDER — NITROGLYCERIN 0.4 MG SL SUBL: SUBLINGUAL_TABLET | SUBLINGUAL | 3 refills | Status: DC

## 2022-07-02 NOTE — Patient Instructions (Signed)
Medication Instructions:  Nitroglycerin refilled today.  Continue all other medications.     Labwork: none  Testing/Procedures: none  Follow-Up: 6 months - Dr. Harl Bowie   Any Other Special Instructions Will Be Listed Below (If Applicable).   If you need a refill on your cardiac medications before your next appointment, please call your pharmacy.

## 2022-07-02 NOTE — Progress Notes (Unsigned)
Cardiology Office Note:    Date:  07/02/2022  ID:  Arno, Cullers Aug 11, 1950, MRN [id number]  PCP:  Lavella Lemons, [place] [facility] Cardiologist:  Carlyle Dolly, MD     Referring MD:  Nation, MD   CC: Here for ED follow-up  History of Present Illness:    Adrian Gibson is a 72 y.o. male with a hx of the following:  CAD, s/p CABG x 4 in 2011 Hypotension HLD Dysphagia/esophageal cancer Hx of DVT AAA   Patient is a very pleasant 72 year old male with past medical history as mentioned above.  Underwent four-vessel CABG at [facility] in 2011, normal EF at that time.  Exercise MPI the following month did not reveal any perfusion defects.  Repeat NST in 2017 was negative for ischemia.  Echocardiogram in January 2023 showed normal EF, mild MR.  Nuclear stress test in March 2023 was negative for ischemia.  Has been followed by GI and oncology for history of dysphagia and esophageal cancer.  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  He is status post esophagectomy in March 2022.  Recent CT scan was concerning for new lesion on esophagus, patient reported recurrent cancer.  Also followed by heme/onc for history of DVT that developed around Port-A-Cath.  Last seen by Dr. Carlyle Dolly on April 13, 2022.  Noted some mild DOE/chest tightness when walking up a hill.  Some orthostatic symptoms with occasional low blood pressure at times.  He was working to increase his electrolytes.  Dr. Carlyle Dolly suggested he needs abdominal ultrasound in 2026 to reevaluate AAA.  Was told to follow-up in 6 months.  Our office was recently contacted in early January for evaluation of recent EKG changes seen at [facility] I evaluated EKG and consulted Dr. Carlyle Dolly who agrees that EKG was unremarkable, NSR, 86 bpm, with nonspecific ST segment changes, no acute ischemic changes. CC for OV on 06/18/2022 was weakness, low BP,  and fatigue. Patient stated he didn't feel good. While getting chemo treatments, his blood pressure dropped, became lightheaded, and then received IV fluids at [facility].  According to wife's report, they were seen at the ED at [facility] on June 15, 2022 and overall workup was negative, he received IV fluids, and was sent home.  Patient denied any chest pain, said breathing is shallow when his blood pressure is down.  Did note dizziness often.  BP was labile and mainly hypotensive with max SBP at 112.   Noted 24 lbs weight loss since November. VS in office on arrival was 70/44, repeat manual was 78/39.  Shallow breathing with diminished breath sounds to auscultation with rales noted to posterior left upper lobe. Was sent to [facility] for further evaluation. Received IV fluids and CXR was unremarkable. Was d/c later same day in stable condition. After pt left hospital, wife called office requesting Midodrine prescription. Was started on Midodrine 2.5 mg TID PRN for BP < 90/60.   Today he presents for follow-up.  He states he is feeling much better.  Patient reports not having to take midodrine.  Doing well from a cardiac perspective.  Has been taking prednisone and will be having tapering of the medication performed soon.  Denies any acute cardiac complaints or issues. Denies any chest pain, shortness of breath, palpitations, syncope, presyncope, dizziness, orthopnea, PND, swelling or significant weight changes, acute bleeding, or claudication.  Today he is requesting a refill of his  nitroglycerin.  Denies any other cardiac complaints or issues.  SH: In his free time, he enjoys woodworking and performing yard work.    Past Medical History:  Diagnosis Date   CAD (coronary artery disease)    multivessel/ left main CAD.Marland Kitchen Four-vessel CABG 8/11... Normal left ventricular funciont   Cancer ([place]) 2020   esophageal   Carpal tunnel syndrome    Dyslipidemia    Fusion of spine    Cervical vertebra fusion C5  C6 C7   GERD (gastroesophageal reflux disease)    Nasal polyps 2000   with rhinoplasty   Sleep apnea     Past Surgical History:  Procedure Laterality Date   BIOPSY  09/19/2018   Procedure: BIOPSY;  Surgeon: Daneil Dolin, MD;  Location: [facility];  Service: Endoscopy;;  esophageal   CARPAL TUNNEL RELEASE     left   CATARACT EXTRACTION     Bilateral   CERVICAL LAMINECTOMY     and diskectomy   CORONARY ARTERY BYPASS GRAFT     X 4   ESOPHAGOGASTRODUODENOSCOPY N/A 09/19/2018   Procedure: ESOPHAGOGASTRODUODENOSCOPY (EGD);  Surgeon: Daneil Dolin, MD;  Location: [facility];  Service: Endoscopy;  Laterality: N/A;  8:45am - office spoke with patient   RHINOPLASTY     Nasal   Ulnar Nerve Release from Left      Current Medications: Current Meds  Medication Sig   allopurinol (ZYLOPRIM) 100 MG tablet Take 100 mg by mouth at bedtime.    apixaban (ELIQUIS) 5 MG TABS tablet Take 5 mg by mouth 2 (two) times daily.   Carboxymethylcellulose Sodium 0.25 % SOLN Apply 1 drop to eye 2 (two) times daily as needed (dry/irritated eyes.).   Cholecalciferol (VITAMIN D3 PO) Take 1 tablet by mouth daily.   fluticasone (FLONASE) 50 MCG/ACT nasal spray Place 2 sprays into the nose daily as needed (congestion).   midodrine (PROAMATINE) 2.5 MG tablet Take 1 tablet (2.5 mg total) by mouth 3 (three) times daily with meals as needed.   Multiple Vitamin (MULTIVITAMIN WITH MINERALS) TABS tablet Take 1 tablet by mouth daily.   Multiple Vitamins-Minerals (ZINC PO) Take 1 tablet by mouth daily.   pantoprazole (PROTONIX) 40 MG tablet Take 1 tablet (40 mg total) by mouth daily. 30 minutes before breakfast   pregabalin (LYRICA) 100 MG capsule Take 1 capsule by mouth 2 (two) times daily.   rosuvastatin (CRESTOR) 20 MG tablet Take 20 mg by mouth every evening.    sucralfate (CARAFATE) 1 GM/10ML suspension Take by mouth as needed.   nitroGLYCERIN (NITROSTAT) 0.4 MG SL tablet TAKE 1 TABLET BY MOUTH UNDER TONGUE  EVERY 5 MINUTES AS NEEDED (Patient taking differently: Place 0.4 mg under the tongue every 5 (five) minutes as needed for chest pain.)     Allergies:   Prochlorperazine, Prochlorperazine edisylate, and Atorvastatin   Social History   Socioeconomic History   Marital status: Married    Spouse name: Not on file   Number of children: Not on file   Years of education: Not on file   Highest education level: Not on file  Occupational History   Occupation: Retired [place]: [employer] [phone]  Tobacco Use   Smoking status: Former    Types: Cigars   Smokeless tobacco: Former    Types: Chew   Tobacco comments:    He has a remote history of smoking cigars, but he quit doing this several years ago.  Vaping Use  Vaping Use: Never used  Substance and Sexual Activity   Alcohol use: Not Currently    Alcohol/week: 0.0 standard drinks of alcohol    Comment: Occasional beer   Drug use: Never   Sexual activity: Not on file  Other Topics Concern   Not on file  Social History Narrative   The patient lives in [place] with his wife.   Enjoys woodworking and fishing.   Has not been limited physically.   Social Determinants of Health   Financial Resource Strain: Not on file  Food Insecurity: Not on file  Transportation Needs: Not on file  Physical Activity: Not on file  Stress: Not on file  Social Connections: Not on file     Family History: The patient's family history includes Heart attack (age of onset: 28) in his brother; Heart attack (age of onset: 12) in his father. There is no history of Colon cancer or Colon polyps.  ROS:   Review of Systems  Constitutional: Negative.   HENT: Negative.    Eyes: Negative.   Respiratory: Negative.    Cardiovascular: Negative.   Gastrointestinal: Negative.   Genitourinary: Negative.   Musculoskeletal: Negative.   Skin: Negative.   Neurological: Negative.   Endo/Heme/Allergies: Negative.    Psychiatric/Behavioral: Negative.       Please see the history of present illness.    All other systems reviewed and are negative.  EKGs/Labs/Other Studies Reviewed:    The following studies were reviewed today:   EKG:  EKG is not ordered today.  Recent EKG at [facility] reviewed and revealed NSR with nonspecific ST segment changes.    Myoview on 08/21/2021:   The study is normal. The study is low risk.   No ST deviation was noted.   Moderate size mild intensity inferior defect most intense in the resting images with normal wall motion consistent with diaphragmatic attenuation   Left ventricular function is normal. Nuclear stress EF: 69 %. The left ventricular ejection fraction is hyperdynamic (>65%). End diastolic cavity size is normal.  Vascular US Aorta Medicare Screen on 03/17/2017: Final Interpretation:  Abdominal Aorta: No evidence of an abdominal aortic aneurysm was  visualized. The largest aortic measurement is 2.5 cm.  IVC is patent.  Recent Labs: 06/18/2022: ALT 41; BUN 11; Creatinine, Ser 0.71; Hemoglobin 8.8; Platelets 153; Potassium 3.6; Sodium 136  Recent Lipid Panel    Component Value Date/Time   CHOL (H) 01/07/2010 0436    278        ATP III CLASSIFICATION:  <200     mg/dL   Desirable  200-239  mg/dL   Borderline High  >=240    mg/dL   High          TRIG 241 (H) 01/07/2010 0436   HDL 34 (L) 01/07/2010 0436   CHOLHDL 8.2 01/07/2010 0436   VLDL 48 (H) 01/07/2010 0436   LDLCALC (H) 01/07/2010 0436    196        Total Cholesterol/HDL:CHD Risk Coronary Heart Disease Risk Table                     Men   Women  1/2 Average Risk   3.4   3.3  Average Risk       5.0   4.4  2 X Average Risk   9.6   7.1  3 X Average Risk  23.4   11.0        Use the calculated Patient Ratio above and  the CHD Risk Table to determine the patient's CHD Risk.        ATP III CLASSIFICATION (LDL):  <100     mg/dL   Optimal  100-129  mg/dL   Near or Above                     Optimal  130-159  mg/dL   Borderline  160-189  mg/dL   High  >190     mg/dL   Very High    Physical Exam:    VS:  BP 118/70   Pulse 64   Ht 5' 9.5" (1.765 m)   Wt 197 lb 12.8 oz (89.7 kg)   SpO2 99%   BMI 28.79 kg/m     Wt Readings from Last 3 Encounters:  07/02/22 197 lb 12.8 oz (89.7 kg)  06/18/22 189 lb 6.4 oz (85.9 kg)  06/18/22 189 lb 6.4 oz (85.9 kg)     GEN: Thin, 72 y.o. male in no acute distress HEENT: Normal NECK: No JVD; No carotid bruits CARDIAC: S1/S2, RRR, no murmurs, rubs, gallops; 2+ pulses RESPIRATORY:  Clear and diminished without wheezing or rhonchi MUSCULOSKELETAL:  No edema; No deformity  SKIN: Warm and dry NEUROLOGIC:  Alert and oriented x 3 PSYCHIATRIC:  Pleasant affect   ASSESSMENT:    1. Coronary artery disease involving native heart without angina pectoris, unspecified vessel or lesion type   2. Hypotension, unspecified hypotension type   3. Mixed hyperlipidemia   4. Personal history of esophageal cancer   5. History of DVT (deep vein thrombosis)   6. Abdominal aortic aneurysm (AAA) without rupture, unspecified part ([place])     PLAN:    In order of problems listed above:  CAD, s/p CABG x 4 in 2011  NST 08/2021 was negative for ischemia. Stable with no anginal symptoms. No indication for ischemic evaluation. Continue pravastatin and NG PRN. Will refill NG per his request. Heart healthy diet and regular cardiovascular exercise encouraged.   2. Hypotension Recent hospital visit as mentioned above.  Recent improvement in symptoms and patient has not had to take midodrine.  Continue midodrine as needed for low blood pressure.  Encouraged patient to stay well-hydrated. Heart healthy diet and regular cardiovascular exercise encouraged.   3. HLD After chart review, no lipid panel on file since 2021. Being managed by PCP. Continue Crestor. At next visit, plan to request labs from PCP. Heart healthy diet and regular cardiovascular exercise encouraged.    4. Esophageal cancer Continue to follow up with Oncology.   5. History of DVT Being managed by Heme/Onc. Continue Eliquis 5 mg BID, on appropriate dosage. Denies any bleeding issues. Continue to follow-up with Oncology.   6. AAA Infrarenal 3.2 cm AAA noted on CT of Chest, abdomen, and pelvis in 2022. Was recommend to repeat US every three years. Plan to update AAA [place] in 2025. Heart healthy diet and regular cardiovascular exercise encouraged.   7. Disposition: Follow-up with Dr. Carlyle Dolly in 6 months or sooner if anything changes.    Medication Adjustments/Labs and Tests Ordered: Current medicines are reviewed at length with the patient today.  Concerns regarding medicines are outlined above.  No orders of the defined types were placed in this encounter.  Meds ordered this encounter  Medications   nitroGLYCERIN (NITROSTAT) 0.4 MG SL tablet    Sig: TAKE 1 TABLET BY MOUTH UNDER TONGUE EVERY 5 MINUTES AS NEEDED    Dispense:  25 tablet  Refill:  3    Patient Instructions  Medication Instructions:  Nitroglycerin refilled today.  Continue all other medications.     Labwork: none  Testing/Procedures: none  Follow-Up: 6 months - Dr. Harl Bowie   Any Other Special Instructions Will Be Listed Below (If Applicable).   If you need a refill on your cardiac medications before your next appointment, please call your pharmacy.    Signed, Finis Bud, NP  07/04/2022 5:55 PM    [place]

## 2022-07-05 DIAGNOSIS — C155 Malignant neoplasm of lower third of esophagus: Secondary | ICD-10-CM | POA: Diagnosis not present

## 2022-07-05 DIAGNOSIS — Z95828 Presence of other vascular implants and grafts: Secondary | ICD-10-CM | POA: Diagnosis not present

## 2022-07-08 DIAGNOSIS — G629 Polyneuropathy, unspecified: Secondary | ICD-10-CM | POA: Diagnosis not present

## 2022-07-08 DIAGNOSIS — C155 Malignant neoplasm of lower third of esophagus: Secondary | ICD-10-CM | POA: Diagnosis not present

## 2022-07-08 DIAGNOSIS — Z95828 Presence of other vascular implants and grafts: Secondary | ICD-10-CM | POA: Diagnosis not present

## 2022-07-20 DIAGNOSIS — J019 Acute sinusitis, unspecified: Secondary | ICD-10-CM | POA: Diagnosis not present

## 2022-07-20 DIAGNOSIS — R030 Elevated blood-pressure reading, without diagnosis of hypertension: Secondary | ICD-10-CM | POA: Diagnosis not present

## 2022-07-20 DIAGNOSIS — C155 Malignant neoplasm of lower third of esophagus: Secondary | ICD-10-CM | POA: Diagnosis not present

## 2022-07-20 DIAGNOSIS — Z6829 Body mass index (BMI) 29.0-29.9, adult: Secondary | ICD-10-CM | POA: Diagnosis not present

## 2022-07-20 DIAGNOSIS — J209 Acute bronchitis, unspecified: Secondary | ICD-10-CM | POA: Diagnosis not present

## 2022-07-22 DIAGNOSIS — K828 Other specified diseases of gallbladder: Secondary | ICD-10-CM | POA: Diagnosis not present

## 2022-07-22 DIAGNOSIS — R918 Other nonspecific abnormal finding of lung field: Secondary | ICD-10-CM | POA: Diagnosis not present

## 2022-07-22 DIAGNOSIS — J479 Bronchiectasis, uncomplicated: Secondary | ICD-10-CM | POA: Diagnosis not present

## 2022-07-22 DIAGNOSIS — C159 Malignant neoplasm of esophagus, unspecified: Secondary | ICD-10-CM | POA: Diagnosis not present

## 2022-07-22 DIAGNOSIS — I700 Atherosclerosis of aorta: Secondary | ICD-10-CM | POA: Diagnosis not present

## 2022-07-22 DIAGNOSIS — K7689 Other specified diseases of liver: Secondary | ICD-10-CM | POA: Diagnosis not present

## 2022-07-22 DIAGNOSIS — C155 Malignant neoplasm of lower third of esophagus: Secondary | ICD-10-CM | POA: Diagnosis not present

## 2022-07-23 DIAGNOSIS — R030 Elevated blood-pressure reading, without diagnosis of hypertension: Secondary | ICD-10-CM | POA: Diagnosis not present

## 2022-07-23 DIAGNOSIS — Z6829 Body mass index (BMI) 29.0-29.9, adult: Secondary | ICD-10-CM | POA: Diagnosis not present

## 2022-07-23 DIAGNOSIS — J019 Acute sinusitis, unspecified: Secondary | ICD-10-CM | POA: Diagnosis not present

## 2022-07-23 DIAGNOSIS — J209 Acute bronchitis, unspecified: Secondary | ICD-10-CM | POA: Diagnosis not present

## 2022-07-26 DIAGNOSIS — E7800 Pure hypercholesterolemia, unspecified: Secondary | ICD-10-CM | POA: Diagnosis not present

## 2022-07-26 DIAGNOSIS — C155 Malignant neoplasm of lower third of esophagus: Secondary | ICD-10-CM | POA: Diagnosis not present

## 2022-07-26 DIAGNOSIS — Z20822 Contact with and (suspected) exposure to covid-19: Secondary | ICD-10-CM | POA: Diagnosis not present

## 2022-07-26 DIAGNOSIS — G620 Drug-induced polyneuropathy: Secondary | ICD-10-CM | POA: Diagnosis not present

## 2022-07-26 DIAGNOSIS — J9691 Respiratory failure, unspecified with hypoxia: Secondary | ICD-10-CM | POA: Diagnosis not present

## 2022-07-26 DIAGNOSIS — R918 Other nonspecific abnormal finding of lung field: Secondary | ICD-10-CM | POA: Diagnosis not present

## 2022-07-26 DIAGNOSIS — J9601 Acute respiratory failure with hypoxia: Secondary | ICD-10-CM | POA: Diagnosis not present

## 2022-07-26 DIAGNOSIS — K219 Gastro-esophageal reflux disease without esophagitis: Secondary | ICD-10-CM | POA: Diagnosis not present

## 2022-07-26 DIAGNOSIS — J188 Other pneumonia, unspecified organism: Secondary | ICD-10-CM | POA: Diagnosis not present

## 2022-07-26 DIAGNOSIS — R0600 Dyspnea, unspecified: Secondary | ICD-10-CM | POA: Diagnosis not present

## 2022-07-26 DIAGNOSIS — J702 Acute drug-induced interstitial lung disorders: Secondary | ICD-10-CM | POA: Diagnosis not present

## 2022-07-26 DIAGNOSIS — N401 Enlarged prostate with lower urinary tract symptoms: Secondary | ICD-10-CM | POA: Diagnosis not present

## 2022-07-26 DIAGNOSIS — E860 Dehydration: Secondary | ICD-10-CM | POA: Diagnosis not present

## 2022-07-26 DIAGNOSIS — C7800 Secondary malignant neoplasm of unspecified lung: Secondary | ICD-10-CM | POA: Diagnosis not present

## 2022-07-26 DIAGNOSIS — C159 Malignant neoplasm of esophagus, unspecified: Secondary | ICD-10-CM | POA: Diagnosis not present

## 2022-07-26 DIAGNOSIS — R9431 Abnormal electrocardiogram [ECG] [EKG]: Secondary | ICD-10-CM | POA: Diagnosis not present

## 2022-07-26 DIAGNOSIS — M7989 Other specified soft tissue disorders: Secondary | ICD-10-CM | POA: Diagnosis not present

## 2022-07-26 DIAGNOSIS — R0602 Shortness of breath: Secondary | ICD-10-CM | POA: Diagnosis not present

## 2022-07-26 DIAGNOSIS — R0902 Hypoxemia: Secondary | ICD-10-CM | POA: Diagnosis not present

## 2022-07-26 DIAGNOSIS — Z9981 Dependence on supplemental oxygen: Secondary | ICD-10-CM | POA: Diagnosis not present

## 2022-07-26 DIAGNOSIS — I2510 Atherosclerotic heart disease of native coronary artery without angina pectoris: Secondary | ICD-10-CM | POA: Diagnosis not present

## 2022-08-09 DIAGNOSIS — I10 Essential (primary) hypertension: Secondary | ICD-10-CM | POA: Diagnosis not present

## 2022-08-09 DIAGNOSIS — G620 Drug-induced polyneuropathy: Secondary | ICD-10-CM | POA: Diagnosis not present

## 2022-08-09 DIAGNOSIS — C7801 Secondary malignant neoplasm of right lung: Secondary | ICD-10-CM | POA: Diagnosis not present

## 2022-08-09 DIAGNOSIS — J704 Drug-induced interstitial lung disorders, unspecified: Secondary | ICD-10-CM | POA: Diagnosis not present

## 2022-08-09 DIAGNOSIS — I2510 Atherosclerotic heart disease of native coronary artery without angina pectoris: Secondary | ICD-10-CM | POA: Diagnosis not present

## 2022-08-09 DIAGNOSIS — J9601 Acute respiratory failure with hypoxia: Secondary | ICD-10-CM | POA: Diagnosis not present

## 2022-08-09 DIAGNOSIS — C160 Malignant neoplasm of cardia: Secondary | ICD-10-CM | POA: Diagnosis not present

## 2022-08-09 DIAGNOSIS — T451X5D Adverse effect of antineoplastic and immunosuppressive drugs, subsequent encounter: Secondary | ICD-10-CM | POA: Diagnosis not present

## 2022-08-09 DIAGNOSIS — J700 Acute pulmonary manifestations due to radiation: Secondary | ICD-10-CM | POA: Diagnosis not present

## 2022-08-12 DIAGNOSIS — C155 Malignant neoplasm of lower third of esophagus: Secondary | ICD-10-CM | POA: Diagnosis not present

## 2022-08-13 DIAGNOSIS — G620 Drug-induced polyneuropathy: Secondary | ICD-10-CM | POA: Diagnosis not present

## 2022-08-13 DIAGNOSIS — J704 Drug-induced interstitial lung disorders, unspecified: Secondary | ICD-10-CM | POA: Diagnosis not present

## 2022-08-13 DIAGNOSIS — J700 Acute pulmonary manifestations due to radiation: Secondary | ICD-10-CM | POA: Diagnosis not present

## 2022-08-13 DIAGNOSIS — T451X5D Adverse effect of antineoplastic and immunosuppressive drugs, subsequent encounter: Secondary | ICD-10-CM | POA: Diagnosis not present

## 2022-08-13 DIAGNOSIS — C159 Malignant neoplasm of esophagus, unspecified: Secondary | ICD-10-CM | POA: Diagnosis not present

## 2022-08-13 DIAGNOSIS — R030 Elevated blood-pressure reading, without diagnosis of hypertension: Secondary | ICD-10-CM | POA: Diagnosis not present

## 2022-08-13 DIAGNOSIS — J309 Allergic rhinitis, unspecified: Secondary | ICD-10-CM | POA: Diagnosis not present

## 2022-08-13 DIAGNOSIS — I2510 Atherosclerotic heart disease of native coronary artery without angina pectoris: Secondary | ICD-10-CM | POA: Diagnosis not present

## 2022-08-13 DIAGNOSIS — C7801 Secondary malignant neoplasm of right lung: Secondary | ICD-10-CM | POA: Diagnosis not present

## 2022-08-13 DIAGNOSIS — C160 Malignant neoplasm of cardia: Secondary | ICD-10-CM | POA: Diagnosis not present

## 2022-08-13 DIAGNOSIS — I10 Essential (primary) hypertension: Secondary | ICD-10-CM | POA: Diagnosis not present

## 2022-08-13 DIAGNOSIS — J9601 Acute respiratory failure with hypoxia: Secondary | ICD-10-CM | POA: Diagnosis not present

## 2022-08-16 DIAGNOSIS — C160 Malignant neoplasm of cardia: Secondary | ICD-10-CM | POA: Diagnosis not present

## 2022-08-16 DIAGNOSIS — I2510 Atherosclerotic heart disease of native coronary artery without angina pectoris: Secondary | ICD-10-CM | POA: Diagnosis not present

## 2022-08-16 DIAGNOSIS — I2584 Coronary atherosclerosis due to calcified coronary lesion: Secondary | ICD-10-CM | POA: Diagnosis not present

## 2022-08-16 DIAGNOSIS — Z6827 Body mass index (BMI) 27.0-27.9, adult: Secondary | ICD-10-CM | POA: Diagnosis not present

## 2022-08-16 DIAGNOSIS — J9601 Acute respiratory failure with hypoxia: Secondary | ICD-10-CM | POA: Diagnosis not present

## 2022-08-16 DIAGNOSIS — J700 Acute pulmonary manifestations due to radiation: Secondary | ICD-10-CM | POA: Diagnosis not present

## 2022-08-16 DIAGNOSIS — C159 Malignant neoplasm of esophagus, unspecified: Secondary | ICD-10-CM | POA: Diagnosis not present

## 2022-08-16 DIAGNOSIS — J704 Drug-induced interstitial lung disorders, unspecified: Secondary | ICD-10-CM | POA: Diagnosis not present

## 2022-08-16 DIAGNOSIS — G620 Drug-induced polyneuropathy: Secondary | ICD-10-CM | POA: Diagnosis not present

## 2022-08-16 DIAGNOSIS — M1000 Idiopathic gout, unspecified site: Secondary | ICD-10-CM | POA: Diagnosis not present

## 2022-08-16 DIAGNOSIS — R030 Elevated blood-pressure reading, without diagnosis of hypertension: Secondary | ICD-10-CM | POA: Diagnosis not present

## 2022-08-16 DIAGNOSIS — C7801 Secondary malignant neoplasm of right lung: Secondary | ICD-10-CM | POA: Diagnosis not present

## 2022-08-16 DIAGNOSIS — K219 Gastro-esophageal reflux disease without esophagitis: Secondary | ICD-10-CM | POA: Diagnosis not present

## 2022-08-16 DIAGNOSIS — T451X5D Adverse effect of antineoplastic and immunosuppressive drugs, subsequent encounter: Secondary | ICD-10-CM | POA: Diagnosis not present

## 2022-08-16 DIAGNOSIS — I10 Essential (primary) hypertension: Secondary | ICD-10-CM | POA: Diagnosis not present

## 2022-08-20 DIAGNOSIS — I2510 Atherosclerotic heart disease of native coronary artery without angina pectoris: Secondary | ICD-10-CM | POA: Diagnosis not present

## 2022-08-20 DIAGNOSIS — C160 Malignant neoplasm of cardia: Secondary | ICD-10-CM | POA: Diagnosis not present

## 2022-08-20 DIAGNOSIS — J700 Acute pulmonary manifestations due to radiation: Secondary | ICD-10-CM | POA: Diagnosis not present

## 2022-08-20 DIAGNOSIS — J704 Drug-induced interstitial lung disorders, unspecified: Secondary | ICD-10-CM | POA: Diagnosis not present

## 2022-08-20 DIAGNOSIS — G620 Drug-induced polyneuropathy: Secondary | ICD-10-CM | POA: Diagnosis not present

## 2022-08-20 DIAGNOSIS — C7801 Secondary malignant neoplasm of right lung: Secondary | ICD-10-CM | POA: Diagnosis not present

## 2022-08-20 DIAGNOSIS — I10 Essential (primary) hypertension: Secondary | ICD-10-CM | POA: Diagnosis not present

## 2022-08-20 DIAGNOSIS — T451X5D Adverse effect of antineoplastic and immunosuppressive drugs, subsequent encounter: Secondary | ICD-10-CM | POA: Diagnosis not present

## 2022-08-20 DIAGNOSIS — J9601 Acute respiratory failure with hypoxia: Secondary | ICD-10-CM | POA: Diagnosis not present

## 2022-08-23 DIAGNOSIS — J704 Drug-induced interstitial lung disorders, unspecified: Secondary | ICD-10-CM | POA: Diagnosis not present

## 2022-08-23 DIAGNOSIS — J700 Acute pulmonary manifestations due to radiation: Secondary | ICD-10-CM | POA: Diagnosis not present

## 2022-08-23 DIAGNOSIS — T451X5D Adverse effect of antineoplastic and immunosuppressive drugs, subsequent encounter: Secondary | ICD-10-CM | POA: Diagnosis not present

## 2022-08-23 DIAGNOSIS — C160 Malignant neoplasm of cardia: Secondary | ICD-10-CM | POA: Diagnosis not present

## 2022-08-23 DIAGNOSIS — I10 Essential (primary) hypertension: Secondary | ICD-10-CM | POA: Diagnosis not present

## 2022-08-23 DIAGNOSIS — G620 Drug-induced polyneuropathy: Secondary | ICD-10-CM | POA: Diagnosis not present

## 2022-08-23 DIAGNOSIS — J9601 Acute respiratory failure with hypoxia: Secondary | ICD-10-CM | POA: Diagnosis not present

## 2022-08-23 DIAGNOSIS — C7801 Secondary malignant neoplasm of right lung: Secondary | ICD-10-CM | POA: Diagnosis not present

## 2022-08-23 DIAGNOSIS — I2510 Atherosclerotic heart disease of native coronary artery without angina pectoris: Secondary | ICD-10-CM | POA: Diagnosis not present

## 2022-08-24 DIAGNOSIS — J849 Interstitial pulmonary disease, unspecified: Secondary | ICD-10-CM | POA: Diagnosis not present

## 2022-08-24 DIAGNOSIS — J984 Other disorders of lung: Secondary | ICD-10-CM | POA: Diagnosis not present

## 2022-08-24 DIAGNOSIS — T50905A Adverse effect of unspecified drugs, medicaments and biological substances, initial encounter: Secondary | ICD-10-CM | POA: Diagnosis not present

## 2022-08-24 DIAGNOSIS — J8489 Other specified interstitial pulmonary diseases: Secondary | ICD-10-CM | POA: Diagnosis not present

## 2022-08-26 DIAGNOSIS — J700 Acute pulmonary manifestations due to radiation: Secondary | ICD-10-CM | POA: Diagnosis not present

## 2022-08-26 DIAGNOSIS — T451X5D Adverse effect of antineoplastic and immunosuppressive drugs, subsequent encounter: Secondary | ICD-10-CM | POA: Diagnosis not present

## 2022-08-26 DIAGNOSIS — I2510 Atherosclerotic heart disease of native coronary artery without angina pectoris: Secondary | ICD-10-CM | POA: Diagnosis not present

## 2022-08-26 DIAGNOSIS — C160 Malignant neoplasm of cardia: Secondary | ICD-10-CM | POA: Diagnosis not present

## 2022-08-26 DIAGNOSIS — G620 Drug-induced polyneuropathy: Secondary | ICD-10-CM | POA: Diagnosis not present

## 2022-08-26 DIAGNOSIS — J704 Drug-induced interstitial lung disorders, unspecified: Secondary | ICD-10-CM | POA: Diagnosis not present

## 2022-08-26 DIAGNOSIS — C7801 Secondary malignant neoplasm of right lung: Secondary | ICD-10-CM | POA: Diagnosis not present

## 2022-08-26 DIAGNOSIS — I10 Essential (primary) hypertension: Secondary | ICD-10-CM | POA: Diagnosis not present

## 2022-08-26 DIAGNOSIS — J9601 Acute respiratory failure with hypoxia: Secondary | ICD-10-CM | POA: Diagnosis not present

## 2022-08-30 DIAGNOSIS — T50995A Adverse effect of other drugs, medicaments and biological substances, initial encounter: Secondary | ICD-10-CM | POA: Diagnosis not present

## 2022-08-30 DIAGNOSIS — G4733 Obstructive sleep apnea (adult) (pediatric): Secondary | ICD-10-CM | POA: Diagnosis not present

## 2022-08-30 DIAGNOSIS — J984 Other disorders of lung: Secondary | ICD-10-CM | POA: Diagnosis not present

## 2022-08-30 DIAGNOSIS — D72810 Lymphocytopenia: Secondary | ICD-10-CM | POA: Diagnosis not present

## 2022-08-30 DIAGNOSIS — I10 Essential (primary) hypertension: Secondary | ICD-10-CM | POA: Diagnosis not present

## 2022-08-30 DIAGNOSIS — K219 Gastro-esophageal reflux disease without esophagitis: Secondary | ICD-10-CM | POA: Diagnosis not present

## 2022-08-30 DIAGNOSIS — J9601 Acute respiratory failure with hypoxia: Secondary | ICD-10-CM | POA: Diagnosis not present

## 2022-08-30 DIAGNOSIS — T50905A Adverse effect of unspecified drugs, medicaments and biological substances, initial encounter: Secondary | ICD-10-CM | POA: Diagnosis not present

## 2022-08-30 DIAGNOSIS — J849 Interstitial pulmonary disease, unspecified: Secondary | ICD-10-CM | POA: Diagnosis not present

## 2022-08-30 DIAGNOSIS — C155 Malignant neoplasm of lower third of esophagus: Secondary | ICD-10-CM | POA: Diagnosis not present

## 2022-08-30 DIAGNOSIS — I2510 Atherosclerotic heart disease of native coronary artery without angina pectoris: Secondary | ICD-10-CM | POA: Diagnosis not present

## 2022-08-30 DIAGNOSIS — M109 Gout, unspecified: Secondary | ICD-10-CM | POA: Diagnosis not present

## 2022-08-31 DIAGNOSIS — I10 Essential (primary) hypertension: Secondary | ICD-10-CM | POA: Diagnosis not present

## 2022-08-31 DIAGNOSIS — J9601 Acute respiratory failure with hypoxia: Secondary | ICD-10-CM | POA: Diagnosis not present

## 2022-08-31 DIAGNOSIS — C7801 Secondary malignant neoplasm of right lung: Secondary | ICD-10-CM | POA: Diagnosis not present

## 2022-08-31 DIAGNOSIS — C160 Malignant neoplasm of cardia: Secondary | ICD-10-CM | POA: Diagnosis not present

## 2022-08-31 DIAGNOSIS — J700 Acute pulmonary manifestations due to radiation: Secondary | ICD-10-CM | POA: Diagnosis not present

## 2022-08-31 DIAGNOSIS — I2510 Atherosclerotic heart disease of native coronary artery without angina pectoris: Secondary | ICD-10-CM | POA: Diagnosis not present

## 2022-08-31 DIAGNOSIS — G620 Drug-induced polyneuropathy: Secondary | ICD-10-CM | POA: Diagnosis not present

## 2022-08-31 DIAGNOSIS — J704 Drug-induced interstitial lung disorders, unspecified: Secondary | ICD-10-CM | POA: Diagnosis not present

## 2022-08-31 DIAGNOSIS — T451X5D Adverse effect of antineoplastic and immunosuppressive drugs, subsequent encounter: Secondary | ICD-10-CM | POA: Diagnosis not present

## 2022-09-02 DIAGNOSIS — J704 Drug-induced interstitial lung disorders, unspecified: Secondary | ICD-10-CM | POA: Diagnosis not present

## 2022-09-02 DIAGNOSIS — C7801 Secondary malignant neoplasm of right lung: Secondary | ICD-10-CM | POA: Diagnosis not present

## 2022-09-02 DIAGNOSIS — J9601 Acute respiratory failure with hypoxia: Secondary | ICD-10-CM | POA: Diagnosis not present

## 2022-09-02 DIAGNOSIS — I10 Essential (primary) hypertension: Secondary | ICD-10-CM | POA: Diagnosis not present

## 2022-09-02 DIAGNOSIS — C160 Malignant neoplasm of cardia: Secondary | ICD-10-CM | POA: Diagnosis not present

## 2022-09-02 DIAGNOSIS — T451X5D Adverse effect of antineoplastic and immunosuppressive drugs, subsequent encounter: Secondary | ICD-10-CM | POA: Diagnosis not present

## 2022-09-02 DIAGNOSIS — I2510 Atherosclerotic heart disease of native coronary artery without angina pectoris: Secondary | ICD-10-CM | POA: Diagnosis not present

## 2022-09-02 DIAGNOSIS — G620 Drug-induced polyneuropathy: Secondary | ICD-10-CM | POA: Diagnosis not present

## 2022-09-02 DIAGNOSIS — J700 Acute pulmonary manifestations due to radiation: Secondary | ICD-10-CM | POA: Diagnosis not present

## 2022-09-03 DIAGNOSIS — R918 Other nonspecific abnormal finding of lung field: Secondary | ICD-10-CM | POA: Diagnosis not present

## 2022-09-03 DIAGNOSIS — C155 Malignant neoplasm of lower third of esophagus: Secondary | ICD-10-CM | POA: Diagnosis not present

## 2022-09-03 DIAGNOSIS — R5383 Other fatigue: Secondary | ICD-10-CM | POA: Diagnosis not present

## 2022-09-03 DIAGNOSIS — J939 Pneumothorax, unspecified: Secondary | ICD-10-CM | POA: Diagnosis not present

## 2022-09-03 DIAGNOSIS — J982 Interstitial emphysema: Secondary | ICD-10-CM | POA: Diagnosis not present

## 2022-09-03 DIAGNOSIS — T50905A Adverse effect of unspecified drugs, medicaments and biological substances, initial encounter: Secondary | ICD-10-CM | POA: Diagnosis not present

## 2022-09-03 DIAGNOSIS — I10 Essential (primary) hypertension: Secondary | ICD-10-CM | POA: Diagnosis not present

## 2022-09-03 DIAGNOSIS — I2510 Atherosclerotic heart disease of native coronary artery without angina pectoris: Secondary | ICD-10-CM | POA: Diagnosis not present

## 2022-09-03 DIAGNOSIS — G4733 Obstructive sleep apnea (adult) (pediatric): Secondary | ICD-10-CM | POA: Diagnosis not present

## 2022-09-03 DIAGNOSIS — K219 Gastro-esophageal reflux disease without esophagitis: Secondary | ICD-10-CM | POA: Diagnosis not present

## 2022-09-03 DIAGNOSIS — C799 Secondary malignant neoplasm of unspecified site: Secondary | ICD-10-CM | POA: Diagnosis not present

## 2022-09-03 DIAGNOSIS — R0602 Shortness of breath: Secondary | ICD-10-CM | POA: Diagnosis not present

## 2022-09-03 DIAGNOSIS — M109 Gout, unspecified: Secondary | ICD-10-CM | POA: Diagnosis not present

## 2022-09-03 DIAGNOSIS — J984 Other disorders of lung: Secondary | ICD-10-CM | POA: Diagnosis not present

## 2022-09-03 DIAGNOSIS — Z87891 Personal history of nicotine dependence: Secondary | ICD-10-CM | POA: Diagnosis not present

## 2022-09-03 DIAGNOSIS — C160 Malignant neoplasm of cardia: Secondary | ICD-10-CM | POA: Diagnosis not present

## 2022-09-03 DIAGNOSIS — J479 Bronchiectasis, uncomplicated: Secondary | ICD-10-CM | POA: Diagnosis not present

## 2022-09-03 DIAGNOSIS — J849 Interstitial pulmonary disease, unspecified: Secondary | ICD-10-CM | POA: Diagnosis not present

## 2022-09-03 DIAGNOSIS — T797XXA Traumatic subcutaneous emphysema, initial encounter: Secondary | ICD-10-CM | POA: Diagnosis not present

## 2022-09-04 DIAGNOSIS — J9620 Acute and chronic respiratory failure, unspecified whether with hypoxia or hypercapnia: Secondary | ICD-10-CM | POA: Diagnosis not present

## 2022-09-04 DIAGNOSIS — J939 Pneumothorax, unspecified: Secondary | ICD-10-CM | POA: Diagnosis not present

## 2022-09-04 DIAGNOSIS — J982 Interstitial emphysema: Secondary | ICD-10-CM | POA: Diagnosis not present

## 2022-09-05 DIAGNOSIS — J9620 Acute and chronic respiratory failure, unspecified whether with hypoxia or hypercapnia: Secondary | ICD-10-CM | POA: Diagnosis not present

## 2022-09-05 DIAGNOSIS — J939 Pneumothorax, unspecified: Secondary | ICD-10-CM | POA: Diagnosis not present

## 2022-09-05 DIAGNOSIS — J982 Interstitial emphysema: Secondary | ICD-10-CM | POA: Diagnosis not present

## 2022-09-05 DIAGNOSIS — T797XXA Traumatic subcutaneous emphysema, initial encounter: Secondary | ICD-10-CM | POA: Diagnosis not present

## 2022-09-05 DIAGNOSIS — R0902 Hypoxemia: Secondary | ICD-10-CM | POA: Diagnosis not present

## 2022-09-14 DIAGNOSIS — K219 Gastro-esophageal reflux disease without esophagitis: Secondary | ICD-10-CM | POA: Diagnosis not present

## 2022-09-14 DIAGNOSIS — Z8709 Personal history of other diseases of the respiratory system: Secondary | ICD-10-CM | POA: Diagnosis not present

## 2022-09-14 DIAGNOSIS — C159 Malignant neoplasm of esophagus, unspecified: Secondary | ICD-10-CM | POA: Diagnosis not present

## 2022-09-14 DIAGNOSIS — R0789 Other chest pain: Secondary | ICD-10-CM | POA: Diagnosis not present

## 2022-09-15 DIAGNOSIS — G620 Drug-induced polyneuropathy: Secondary | ICD-10-CM | POA: Diagnosis not present

## 2022-09-15 DIAGNOSIS — J704 Drug-induced interstitial lung disorders, unspecified: Secondary | ICD-10-CM | POA: Diagnosis not present

## 2022-09-15 DIAGNOSIS — C160 Malignant neoplasm of cardia: Secondary | ICD-10-CM | POA: Diagnosis not present

## 2022-09-15 DIAGNOSIS — J9601 Acute respiratory failure with hypoxia: Secondary | ICD-10-CM | POA: Diagnosis not present

## 2022-09-15 DIAGNOSIS — J700 Acute pulmonary manifestations due to radiation: Secondary | ICD-10-CM | POA: Diagnosis not present

## 2022-09-15 DIAGNOSIS — I10 Essential (primary) hypertension: Secondary | ICD-10-CM | POA: Diagnosis not present

## 2022-09-15 DIAGNOSIS — I2510 Atherosclerotic heart disease of native coronary artery without angina pectoris: Secondary | ICD-10-CM | POA: Diagnosis not present

## 2022-09-15 DIAGNOSIS — T451X5D Adverse effect of antineoplastic and immunosuppressive drugs, subsequent encounter: Secondary | ICD-10-CM | POA: Diagnosis not present

## 2022-09-15 DIAGNOSIS — C7801 Secondary malignant neoplasm of right lung: Secondary | ICD-10-CM | POA: Diagnosis not present

## 2022-09-21 DIAGNOSIS — R9389 Abnormal findings on diagnostic imaging of other specified body structures: Secondary | ICD-10-CM | POA: Diagnosis not present

## 2022-09-21 DIAGNOSIS — R918 Other nonspecific abnormal finding of lung field: Secondary | ICD-10-CM | POA: Diagnosis not present

## 2022-09-21 DIAGNOSIS — C155 Malignant neoplasm of lower third of esophagus: Secondary | ICD-10-CM | POA: Diagnosis not present

## 2022-09-22 DIAGNOSIS — G620 Drug-induced polyneuropathy: Secondary | ICD-10-CM | POA: Diagnosis not present

## 2022-09-22 DIAGNOSIS — J704 Drug-induced interstitial lung disorders, unspecified: Secondary | ICD-10-CM | POA: Diagnosis not present

## 2022-09-22 DIAGNOSIS — T451X5D Adverse effect of antineoplastic and immunosuppressive drugs, subsequent encounter: Secondary | ICD-10-CM | POA: Diagnosis not present

## 2022-09-22 DIAGNOSIS — I10 Essential (primary) hypertension: Secondary | ICD-10-CM | POA: Diagnosis not present

## 2022-09-22 DIAGNOSIS — C7801 Secondary malignant neoplasm of right lung: Secondary | ICD-10-CM | POA: Diagnosis not present

## 2022-09-22 DIAGNOSIS — C160 Malignant neoplasm of cardia: Secondary | ICD-10-CM | POA: Diagnosis not present

## 2022-09-22 DIAGNOSIS — J9601 Acute respiratory failure with hypoxia: Secondary | ICD-10-CM | POA: Diagnosis not present

## 2022-09-22 DIAGNOSIS — I2510 Atherosclerotic heart disease of native coronary artery without angina pectoris: Secondary | ICD-10-CM | POA: Diagnosis not present

## 2022-09-22 DIAGNOSIS — J700 Acute pulmonary manifestations due to radiation: Secondary | ICD-10-CM | POA: Diagnosis not present

## 2022-09-23 DIAGNOSIS — J8489 Other specified interstitial pulmonary diseases: Secondary | ICD-10-CM | POA: Diagnosis not present

## 2022-09-23 DIAGNOSIS — C155 Malignant neoplasm of lower third of esophagus: Secondary | ICD-10-CM | POA: Diagnosis not present

## 2022-09-24 DIAGNOSIS — C155 Malignant neoplasm of lower third of esophagus: Secondary | ICD-10-CM | POA: Diagnosis not present

## 2022-09-28 DIAGNOSIS — L309 Dermatitis, unspecified: Secondary | ICD-10-CM | POA: Diagnosis not present

## 2022-09-30 DIAGNOSIS — C155 Malignant neoplasm of lower third of esophagus: Secondary | ICD-10-CM | POA: Diagnosis not present

## 2022-09-30 DIAGNOSIS — Z95828 Presence of other vascular implants and grafts: Secondary | ICD-10-CM | POA: Diagnosis not present

## 2022-10-04 DIAGNOSIS — C155 Malignant neoplasm of lower third of esophagus: Secondary | ICD-10-CM | POA: Diagnosis not present

## 2022-10-05 DIAGNOSIS — C159 Malignant neoplasm of esophagus, unspecified: Secondary | ICD-10-CM | POA: Diagnosis not present

## 2022-10-05 DIAGNOSIS — R0902 Hypoxemia: Secondary | ICD-10-CM | POA: Diagnosis not present

## 2022-10-05 DIAGNOSIS — K219 Gastro-esophageal reflux disease without esophagitis: Secondary | ICD-10-CM | POA: Diagnosis not present

## 2022-10-05 DIAGNOSIS — Z8709 Personal history of other diseases of the respiratory system: Secondary | ICD-10-CM | POA: Diagnosis not present

## 2022-10-05 DIAGNOSIS — R0789 Other chest pain: Secondary | ICD-10-CM | POA: Diagnosis not present

## 2022-10-11 DIAGNOSIS — K219 Gastro-esophageal reflux disease without esophagitis: Secondary | ICD-10-CM | POA: Diagnosis not present

## 2022-10-11 DIAGNOSIS — R059 Cough, unspecified: Secondary | ICD-10-CM | POA: Diagnosis not present

## 2022-10-11 DIAGNOSIS — C159 Malignant neoplasm of esophagus, unspecified: Secondary | ICD-10-CM | POA: Diagnosis not present

## 2022-10-11 DIAGNOSIS — Z6828 Body mass index (BMI) 28.0-28.9, adult: Secondary | ICD-10-CM | POA: Diagnosis not present

## 2022-10-11 DIAGNOSIS — R030 Elevated blood-pressure reading, without diagnosis of hypertension: Secondary | ICD-10-CM | POA: Diagnosis not present

## 2022-10-11 DIAGNOSIS — C155 Malignant neoplasm of lower third of esophagus: Secondary | ICD-10-CM | POA: Diagnosis not present

## 2022-10-12 DIAGNOSIS — C155 Malignant neoplasm of lower third of esophagus: Secondary | ICD-10-CM | POA: Diagnosis not present

## 2022-10-12 DIAGNOSIS — Z5111 Encounter for antineoplastic chemotherapy: Secondary | ICD-10-CM | POA: Diagnosis not present

## 2022-10-14 DIAGNOSIS — Z451 Encounter for adjustment and management of infusion pump: Secondary | ICD-10-CM | POA: Diagnosis not present

## 2022-10-14 DIAGNOSIS — C155 Malignant neoplasm of lower third of esophagus: Secondary | ICD-10-CM | POA: Diagnosis not present

## 2022-10-14 DIAGNOSIS — Z95828 Presence of other vascular implants and grafts: Secondary | ICD-10-CM | POA: Diagnosis not present

## 2022-10-15 DIAGNOSIS — C155 Malignant neoplasm of lower third of esophagus: Secondary | ICD-10-CM | POA: Diagnosis not present

## 2022-10-18 ENCOUNTER — Encounter: Payer: Self-pay | Admitting: Cardiology

## 2022-10-18 ENCOUNTER — Ambulatory Visit: Payer: Medicare PPO | Attending: Cardiology | Admitting: Cardiology

## 2022-10-18 VITALS — BP 80/45 | HR 92 | Ht 69.0 in | Wt 182.2 lb

## 2022-10-18 DIAGNOSIS — I2510 Atherosclerotic heart disease of native coronary artery without angina pectoris: Secondary | ICD-10-CM

## 2022-10-18 DIAGNOSIS — I959 Hypotension, unspecified: Secondary | ICD-10-CM

## 2022-10-18 DIAGNOSIS — E782 Mixed hyperlipidemia: Secondary | ICD-10-CM

## 2022-10-18 MED ORDER — MIDODRINE HCL 2.5 MG PO TABS: 2.5000 mg | ORAL_TABLET | Freq: Three times a day (TID) | ORAL | Status: DC

## 2022-10-18 NOTE — Patient Instructions (Addendum)
Medication Instructions:   Increase the Midodrine 2.5mg  to three x day  Continue all other medications.     Labwork:  none  Testing/Procedures:  none  Follow-Up:  3 months   Any Other Special Instructions Will Be Listed Below (If Applicable).  Update office if systolic (top number) BP less than 90 OR ongoing symptoms of dizziness   If you need a refill on your cardiac medications before your next appointment, please call your pharmacy.

## 2022-10-18 NOTE — Progress Notes (Signed)
Clinical Summary Adrian Gibson is a 72 y.o.male seen today for follow up of the following medical problems.   1. CAD   - prior CABG 4 vessel 01/2010 at [facility], normal LVEF at that time.   - exercise MPI 02/2012: 10 min, 13 METs, Duke Treadmill score 10 low risk, no perfusion defects.   - 04/2016 nuclear stress: no ischemia - chronic left sided MSK pain ever since CABG      - 01/2019 echo [facility] LVEF 55-60%, mild LVH, aortic sclerosis.  - 05/2020 LVEF 55-60% - 03/2021 Echo [facility] LVEF 55-60%, mild MR Jan 2023 echo [facility]: LVEF 55-60%, mild MR     08/2021 nuclear stress: no ischemia  - no recent chest pains.      2. Low blood pressure - prior issues on chemo, resolved after prior regimen. Had transiently been on midodrine. Was able to come off - just restarted chemo and now with recurrent hypotension.  - irinotecan 6% low bp - working to stay well hydrated    3. Hyperlipidemia   - has tried multiple different statins. Has worked with Dr Dimas Aguas to find a regimen.  - tolerating crestor 20mg  daily.  -request labs from pcp     3. Dysphagia/Esophageal cancer - followed by GI and oncology - EGD showed distal esophageal adenocarcinoma - treated with chemo  - s/p esophagectomy 08/2020   - was in remission, recentlty relapse and back on chemo     4. History of DVT - compliant with eliquis -= developed around port a cath. - followed by heme/onc     5. 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 Normal left ventricular funciont   Cancer ([facility]) 2020   esophageal   Carpal tunnel syndrome    Dyslipidemia    Fusion of spine    Cervical vertebra fusion C5 C6 C7   GERD (gastroesophageal reflux disease)    Nasal polyps 2000   with rhinoplasty   Sleep apnea      Allergies  Allergen Reactions    Prochlorperazine Other (See Comments)    Neck muscles drew up/lip muscles drew up   Prochlorperazine Edisylate    Atorvastatin Other (See Comments)    myalgia     Current Outpatient Medications  Medication Sig Dispense Refill   allopurinol (ZYLOPRIM) 100 MG tablet Take 100 mg by mouth at bedtime.      apixaban (ELIQUIS) 5 MG TABS tablet Take 5 mg by mouth 2 (two) times daily.     Carboxymethylcellulose Sodium 0.25 % SOLN Apply 1 drop to eye 2 (two) times daily as needed (dry/irritated eyes.).     Cholecalciferol (VITAMIN D3 PO) Take 1 tablet by mouth daily.     fluticasone (FLONASE) 50 MCG/ACT nasal spray Place 2 sprays into the nose daily as needed (congestion).     midodrine (PROAMATINE) 2.5 MG tablet Take 1 tablet (2.5 mg total) by mouth 3 (three) times daily with meals as needed. 270 tablet 3   Multiple Vitamin (MULTIVITAMIN WITH MINERALS) TABS tablet Take 1 tablet by mouth daily.     Multiple Vitamins-Minerals (ZINC PO) Take 1 tablet by mouth daily.     nitroGLYCERIN (NITROSTAT) 0.4 MG SL tablet TAKE 1 TABLET BY MOUTH UNDER TONGUE EVERY 5 MINUTES AS NEEDED  25 tablet 3   pantoprazole (PROTONIX) 40 MG tablet Take 1 tablet (40 mg total) by mouth daily. 30 minutes before breakfast 90 tablet 3   pregabalin (LYRICA) 100 MG capsule Take 1 capsule by mouth 2 (two) times daily.     rosuvastatin (CRESTOR) 20 MG tablet Take 20 mg by mouth every evening.      sucralfate (CARAFATE) 1 GM/10ML suspension Take by mouth as needed.     No current facility-administered medications for this visit.     Past Surgical History:  Procedure Laterality Date   BIOPSY  09/19/2018   Procedure: BIOPSY;  Surgeon: Corbin Ade, MD;  Location[facility];  Service: Endoscopy;;  esophageal   CARPAL TUNNEL RELEASE     left   CATARACT EXTRACTION     Bilateral   CERVICAL LAMINECTOMY     and diskectomy   CORONARY ARTERY BYPASS GRAFT     X 4   ESOPHAGOGASTRODUODENOSCOPY N/A 09/19/2018   Procedure:  ESOPHAGOGASTRODUODENOSCOPY (EGD);  Surgeon: Corbin Ade, MD;  Location: [facility];  Service: Endoscopy;  Laterality: N/A;  8:45am - office spoke with patient   RHINOPLASTY     Nasal   Ulnar Nerve Release from Left       Allergies  Allergen Reactions   Prochlorperazine Other (See Comments)    Neck muscles drew up/lip muscles drew up   Prochlorperazine Edisylate    Atorvastatin Other (See Comments)    myalgia      Family History  Problem Relation Age of Onset   Heart attack Father 37   Heart attack Brother 35       Heavy smoker   Colon cancer Neg Hx    Colon polyps Neg Hx      Social History Mr. Feik reports that he has quit smoking. His smoking use included cigars. He has quit using smokeless tobacco.  His smokeless tobacco use included chew. Mr. Scharpf reports that he does not currently use alcohol.   Review of Systems CONSTITUTIONAL: No weight loss, fever, chills, weakness or fatigue.  HEENT: Eyes: No visual loss, blurred vision, double vision or yellow sclerae.No hearing loss, sneezing, congestion, runny nose or sore throat.  SKIN: No rash or itching.  CARDIOVASCULAR: per hpi RESPIRATORY: No shortness of breath, cough or sputum.  GASTROINTESTINAL: No anorexia, nausea, vomiting or diarrhea. No abdominal pain or blood.  GENITOURINARY: No burning on urination, no polyuria NEUROLOGICAL: No headache, dizziness, syncope, paralysis, ataxia, numbness or tingling in the extremities. No change in bowel or bladder control.  MUSCULOSKELETAL: No muscle, back pain, joint pain or stiffness.  LYMPHATICS: No enlarged nodes. No history of splenectomy.  PSYCHIATRIC: No history of depression or anxiety.  ENDOCRINOLOGIC: No reports of sweating, cold or heat intolerance. No polyuria or polydipsia.  Marland Kitchen   Physical Examination Today's Vitals   10/18/22 1439  BP: (!) 80/40  Pulse: 92  SpO2: 97%  Weight: 182 lb 3.2 oz (82.6 kg)  Height: 5\' 9"  (1.753 m)   Body mass index is  26.91 kg/m.  Gen: resting comfortably, no acute distress HEENT: no scleral icterus, pupils equal round and reactive, no palptable cervical adenopathy,  CV: RRR, no m/rg, n ojvd Resp: Clear to auscultation bilaterally GI: abdomen is soft, non-tender, non-distended, normal bowel sounds, no hepatosplenomegaly MSK: extremities are warm, no edema.  Skin: warm, no rash Neuro:  no focal deficits Psych: appropriate affect   Diagnostic Studies  Exercise MPI 02/2012: 10 min, 13 METs, Duke Treadmill score 10  low risk, no perfusion defects   04/2016 Nuclear stress Blood pressure demonstrated a normal response to exercise. There was no ST segment deviation noted during stress. No T wave inversion was noted during stress. The study is normal. This is a low risk study. The left ventricular ejection fraction is hyperdynamic (>65%).   Normal resting and stress perfusion. No ischemia or infarction EF 67%    03/2017 AAA screen Final Interpretation: Abdominal Aorta: No evidence of an abdominal aortic aneurysm was visualized. The largest aortic measurement is 2.5 cm. IVC is patent.   03/2021 echo Exam Date:     04/02/2021 9:10 AM Site Location:     [place] Exam Location:     [place] Admit Date:     04/02/2021  Exam Type:     ECHOCARDIOGRAM FOLLOW UP/LIMITED ECHO  Study Info Indications     - Chest Pain  Complete two-dimensional, color flow and Doppler transthoracic echocardiogram is performed.  Staff Referring Physician:     Trixie Dredge ; Sonographer:     Sinda Du Ordering Physician:     Trixie Dredge  Account #:     [id number]   Summary  1. The left ventricle is normal in size with normal wall thickness.  2. The left ventricular systolic function is normal, LVEF is visually estimated at 55-60%.  3. There is mild mitral valve regurgitation.  4. The left atrium is mildly dilated in size.  5. The right ventricle is normal in size, with normal  systolic function.  6. The right atrium is mildly dilated  in size.   Jan 2023 echo [facility] Summary    1. The left ventricle is normal in size with normal wall thickness.    2. The left ventricular systolic function is normal, LVEF is visually  estimated at 55-60%.    3. There is mild mitral valve regurgitation.    4. The left atrium is mildly dilated in size.    5. The right ventricle is normal in size, with normal systolic function.    6. The right atrium is mildly dilated  in size.        08/2021 nuclear stress The study is normal. The study is low risk.   No ST deviation was noted.   Moderate size mild intensity inferior defect most intense in the resting images with normal wall motion consistent with diaphragmatic attenuation   Left ventricular function is normal. Nuclear stress EF: 69 %. The left ventricular ejection fraction is hyperdynamic (>65%). End diastolic cavity size is normal.     Assessment and Plan   1.  CAD with chronic stable angina -Off ASA since on eliquis for prior DVT - recent stress test was benign. - no recent symptoms, continue current meds   2. Hypotension - recurrent as reported above, only occurs when on chemo - he reports adequate hydration, no GI losses  From review irinotecan 6% low bp - start taking midodrine 2.5mg  tid, can titrate as needed     3. Hyperlipidemia   -has tried mulitple statin regimens but has been limited due to myalgias currently on crestor.   -request pcp labs       Antoine Poche, M.D

## 2022-10-19 ENCOUNTER — Encounter: Payer: Self-pay | Admitting: *Deleted

## 2022-10-25 ENCOUNTER — Encounter: Payer: Self-pay | Admitting: Cardiology

## 2022-10-25 ENCOUNTER — Telehealth: Payer: Self-pay | Admitting: Cardiology

## 2022-10-25 DIAGNOSIS — J8489 Other specified interstitial pulmonary diseases: Secondary | ICD-10-CM | POA: Diagnosis not present

## 2022-10-25 DIAGNOSIS — C159 Malignant neoplasm of esophagus, unspecified: Secondary | ICD-10-CM | POA: Diagnosis not present

## 2022-10-25 DIAGNOSIS — G620 Drug-induced polyneuropathy: Secondary | ICD-10-CM | POA: Diagnosis not present

## 2022-10-25 DIAGNOSIS — C155 Malignant neoplasm of lower third of esophagus: Secondary | ICD-10-CM | POA: Diagnosis not present

## 2022-10-25 DIAGNOSIS — T451X5A Adverse effect of antineoplastic and immunosuppressive drugs, initial encounter: Secondary | ICD-10-CM | POA: Diagnosis not present

## 2022-10-25 DIAGNOSIS — Z09 Encounter for follow-up examination after completed treatment for conditions other than malignant neoplasm: Secondary | ICD-10-CM | POA: Diagnosis not present

## 2022-10-25 DIAGNOSIS — G893 Neoplasm related pain (acute) (chronic): Secondary | ICD-10-CM | POA: Diagnosis not present

## 2022-10-25 DIAGNOSIS — Z9221 Personal history of antineoplastic chemotherapy: Secondary | ICD-10-CM | POA: Diagnosis not present

## 2022-10-25 NOTE — Telephone Encounter (Signed)
Pt c/o medication issue:  1. Name of Medication: midodrine (PROAMATINE) 2.5 MG tablet   2. How are you currently taking this medication (dosage and times per day)? Take 1 tablet (2.5 mg total) by mouth 3 (three) times daily.   3. Are you having a reaction (difficulty breathing--STAT)? No   4. What is your medication issue? Patient called and said that the medication isn't working and wants to know about changing his medication to 1 tablet 5 times daily

## 2022-10-25 NOTE — Telephone Encounter (Signed)
Pt called and states that at his last visit with Dr. Wyline Mood that talked about increasing his Midodrine. Pt states that he has not seen a change in his BP and would like to have medication increased to 5 mg three times daily. Please advise. Recent BP's are 100/70 and 80/64.

## 2022-10-26 DIAGNOSIS — Z5111 Encounter for antineoplastic chemotherapy: Secondary | ICD-10-CM | POA: Diagnosis not present

## 2022-10-26 DIAGNOSIS — C155 Malignant neoplasm of lower third of esophagus: Secondary | ICD-10-CM | POA: Diagnosis not present

## 2022-10-26 MED ORDER — MIDODRINE HCL 5 MG PO TABS: 5.0000 mg | ORAL_TABLET | Freq: Three times a day (TID) | ORAL | 11 refills | Status: DC

## 2022-10-26 NOTE — Telephone Encounter (Signed)
Can increase midodrine to 5mg  tid, keep [place] updated. Room to titrate further if needed   Dominga Ferry MD

## 2022-10-26 NOTE — Telephone Encounter (Signed)
Pt notified of medication increase and the need to keep [place] updated.

## 2022-10-26 NOTE — Telephone Encounter (Signed)
Returned call to pt, no answer. Left msg to call back. Order placed to [facility].

## 2022-10-28 DIAGNOSIS — C155 Malignant neoplasm of lower third of esophagus: Secondary | ICD-10-CM | POA: Diagnosis not present

## 2022-10-28 DIAGNOSIS — Z95828 Presence of other vascular implants and grafts: Secondary | ICD-10-CM | POA: Diagnosis not present

## 2022-10-29 DIAGNOSIS — T50905A Adverse effect of unspecified drugs, medicaments and biological substances, initial encounter: Secondary | ICD-10-CM | POA: Diagnosis not present

## 2022-10-29 DIAGNOSIS — J984 Other disorders of lung: Secondary | ICD-10-CM | POA: Diagnosis not present

## 2022-10-29 DIAGNOSIS — J849 Interstitial pulmonary disease, unspecified: Secondary | ICD-10-CM | POA: Diagnosis not present

## 2022-11-02 DIAGNOSIS — C155 Malignant neoplasm of lower third of esophagus: Secondary | ICD-10-CM | POA: Diagnosis not present

## 2022-11-03 ENCOUNTER — Telehealth: Payer: Self-pay | Admitting: Cardiology

## 2022-11-03 DIAGNOSIS — J849 Interstitial pulmonary disease, unspecified: Secondary | ICD-10-CM

## 2022-11-03 DIAGNOSIS — J9611 Chronic respiratory failure with hypoxia: Secondary | ICD-10-CM | POA: Diagnosis not present

## 2022-11-03 DIAGNOSIS — J9312 Secondary spontaneous pneumothorax: Secondary | ICD-10-CM | POA: Diagnosis not present

## 2022-11-03 DIAGNOSIS — Z2989 Encounter for other specified prophylactic measures: Secondary | ICD-10-CM | POA: Diagnosis not present

## 2022-11-03 DIAGNOSIS — J704 Drug-induced interstitial lung disorders, unspecified: Secondary | ICD-10-CM | POA: Diagnosis not present

## 2022-11-03 DIAGNOSIS — Z0389 Encounter for observation for other suspected diseases and conditions ruled out: Secondary | ICD-10-CM | POA: Diagnosis not present

## 2022-11-03 NOTE — Telephone Encounter (Signed)
   Pt is returning call from [facility]

## 2022-11-03 NOTE — Telephone Encounter (Signed)
Patient says he was told he need a referral to pulmonary rehab by a Dr.Clark. He cannot attend rehab on Tues/Thurs due to other treatment conflicts. I told him I would have Kisha call him on Monday as I do not see any documentation. He is ok with this.

## 2022-11-03 NOTE — Telephone Encounter (Signed)
I do not see where anyone has called him. Bradly Bienenstock is out of the office. I asked that he return call.

## 2022-11-05 DIAGNOSIS — R0600 Dyspnea, unspecified: Secondary | ICD-10-CM | POA: Diagnosis not present

## 2022-11-08 DIAGNOSIS — C7952 Secondary malignant neoplasm of bone marrow: Secondary | ICD-10-CM | POA: Diagnosis not present

## 2022-11-08 DIAGNOSIS — T451X5A Adverse effect of antineoplastic and immunosuppressive drugs, initial encounter: Secondary | ICD-10-CM | POA: Diagnosis not present

## 2022-11-08 DIAGNOSIS — C7951 Secondary malignant neoplasm of bone: Secondary | ICD-10-CM | POA: Diagnosis not present

## 2022-11-08 DIAGNOSIS — I959 Hypotension, unspecified: Secondary | ICD-10-CM | POA: Diagnosis not present

## 2022-11-08 DIAGNOSIS — I7143 Infrarenal abdominal aortic aneurysm, without rupture: Secondary | ICD-10-CM | POA: Diagnosis not present

## 2022-11-08 DIAGNOSIS — C155 Malignant neoplasm of lower third of esophagus: Secondary | ICD-10-CM | POA: Diagnosis not present

## 2022-11-08 DIAGNOSIS — R42 Dizziness and giddiness: Secondary | ICD-10-CM | POA: Diagnosis not present

## 2022-11-08 DIAGNOSIS — C159 Malignant neoplasm of esophagus, unspecified: Secondary | ICD-10-CM | POA: Diagnosis not present

## 2022-11-08 DIAGNOSIS — G620 Drug-induced polyneuropathy: Secondary | ICD-10-CM | POA: Diagnosis not present

## 2022-11-08 DIAGNOSIS — D649 Anemia, unspecified: Secondary | ICD-10-CM | POA: Diagnosis not present

## 2022-11-08 DIAGNOSIS — Z2989 Encounter for other specified prophylactic measures: Secondary | ICD-10-CM | POA: Diagnosis not present

## 2022-11-08 DIAGNOSIS — I700 Atherosclerosis of aorta: Secondary | ICD-10-CM | POA: Diagnosis not present

## 2022-11-08 DIAGNOSIS — J704 Drug-induced interstitial lung disorders, unspecified: Secondary | ICD-10-CM | POA: Diagnosis not present

## 2022-11-08 DIAGNOSIS — Z0389 Encounter for observation for other suspected diseases and conditions ruled out: Secondary | ICD-10-CM | POA: Diagnosis not present

## 2022-11-08 DIAGNOSIS — R55 Syncope and collapse: Secondary | ICD-10-CM | POA: Diagnosis not present

## 2022-11-08 DIAGNOSIS — Z09 Encounter for follow-up examination after completed treatment for conditions other than malignant neoplasm: Secondary | ICD-10-CM | POA: Diagnosis not present

## 2022-11-08 DIAGNOSIS — E860 Dehydration: Secondary | ICD-10-CM | POA: Diagnosis not present

## 2022-11-08 DIAGNOSIS — J9611 Chronic respiratory failure with hypoxia: Secondary | ICD-10-CM | POA: Diagnosis not present

## 2022-11-08 DIAGNOSIS — Z95828 Presence of other vascular implants and grafts: Secondary | ICD-10-CM | POA: Diagnosis not present

## 2022-11-08 DIAGNOSIS — Z7901 Long term (current) use of anticoagulants: Secondary | ICD-10-CM | POA: Diagnosis not present

## 2022-11-08 NOTE — Telephone Encounter (Signed)
Returned call to pt. No answer. Left msg to call back.  

## 2022-11-09 DIAGNOSIS — C155 Malignant neoplasm of lower third of esophagus: Secondary | ICD-10-CM | POA: Diagnosis not present

## 2022-11-11 NOTE — Telephone Encounter (Signed)
Pt is requesting pulmonary rehab. Pt states that he can go every other on every other Tuesday and Thursday. He states that Dr. Chestine Spore has sent his note and is requesting we make the referral for pulmonary rehab. Please advise.

## 2022-11-12 DIAGNOSIS — Z95828 Presence of other vascular implants and grafts: Secondary | ICD-10-CM | POA: Diagnosis not present

## 2022-11-12 DIAGNOSIS — Z48812 Encounter for surgical aftercare following surgery on the circulatory system: Secondary | ICD-10-CM | POA: Diagnosis not present

## 2022-11-12 NOTE — Telephone Encounter (Signed)
Can place referral for pulmonary rehab for interstitial lung disease  Dominga Ferry MD

## 2022-11-12 NOTE — Addendum Note (Signed)
Addended by: Marlyn Corporal A on: 11/12/2022 02:36 PM   Modules accepted: Orders

## 2022-11-12 NOTE — Telephone Encounter (Signed)
Referral placed, patient made aware

## 2022-11-15 DIAGNOSIS — C159 Malignant neoplasm of esophagus, unspecified: Secondary | ICD-10-CM | POA: Diagnosis not present

## 2022-11-15 DIAGNOSIS — R55 Syncope and collapse: Secondary | ICD-10-CM | POA: Diagnosis not present

## 2022-11-15 DIAGNOSIS — J849 Interstitial pulmonary disease, unspecified: Secondary | ICD-10-CM | POA: Diagnosis not present

## 2022-11-15 DIAGNOSIS — Z9221 Personal history of antineoplastic chemotherapy: Secondary | ICD-10-CM | POA: Diagnosis not present

## 2022-11-15 DIAGNOSIS — Z09 Encounter for follow-up examination after completed treatment for conditions other than malignant neoplasm: Secondary | ICD-10-CM | POA: Diagnosis not present

## 2022-11-15 DIAGNOSIS — R42 Dizziness and giddiness: Secondary | ICD-10-CM | POA: Diagnosis not present

## 2022-11-15 DIAGNOSIS — C7952 Secondary malignant neoplasm of bone marrow: Secondary | ICD-10-CM | POA: Diagnosis not present

## 2022-11-15 DIAGNOSIS — C155 Malignant neoplasm of lower third of esophagus: Secondary | ICD-10-CM | POA: Diagnosis not present

## 2022-11-15 DIAGNOSIS — Z7901 Long term (current) use of anticoagulants: Secondary | ICD-10-CM | POA: Diagnosis not present

## 2022-11-15 DIAGNOSIS — Z95828 Presence of other vascular implants and grafts: Secondary | ICD-10-CM | POA: Diagnosis not present

## 2022-11-15 DIAGNOSIS — D649 Anemia, unspecified: Secondary | ICD-10-CM | POA: Diagnosis not present

## 2022-11-15 DIAGNOSIS — K7689 Other specified diseases of liver: Secondary | ICD-10-CM | POA: Diagnosis not present

## 2022-11-15 DIAGNOSIS — E860 Dehydration: Secondary | ICD-10-CM | POA: Diagnosis not present

## 2022-11-15 DIAGNOSIS — I959 Hypotension, unspecified: Secondary | ICD-10-CM | POA: Diagnosis not present

## 2022-11-16 DIAGNOSIS — C155 Malignant neoplasm of lower third of esophagus: Secondary | ICD-10-CM | POA: Diagnosis not present

## 2022-11-16 DIAGNOSIS — Z5111 Encounter for antineoplastic chemotherapy: Secondary | ICD-10-CM | POA: Diagnosis not present

## 2022-11-18 DIAGNOSIS — Z452 Encounter for adjustment and management of vascular access device: Secondary | ICD-10-CM | POA: Diagnosis not present

## 2022-11-18 DIAGNOSIS — C155 Malignant neoplasm of lower third of esophagus: Secondary | ICD-10-CM | POA: Diagnosis not present

## 2022-11-19 DIAGNOSIS — C155 Malignant neoplasm of lower third of esophagus: Secondary | ICD-10-CM | POA: Diagnosis not present

## 2022-11-22 DIAGNOSIS — Z09 Encounter for follow-up examination after completed treatment for conditions other than malignant neoplasm: Secondary | ICD-10-CM | POA: Diagnosis not present

## 2022-11-22 DIAGNOSIS — J939 Pneumothorax, unspecified: Secondary | ICD-10-CM | POA: Diagnosis not present

## 2022-11-22 DIAGNOSIS — J8410 Pulmonary fibrosis, unspecified: Secondary | ICD-10-CM | POA: Diagnosis not present

## 2022-11-22 DIAGNOSIS — J849 Interstitial pulmonary disease, unspecified: Secondary | ICD-10-CM | POA: Diagnosis not present

## 2022-11-22 DIAGNOSIS — J704 Drug-induced interstitial lung disorders, unspecified: Secondary | ICD-10-CM | POA: Diagnosis not present

## 2022-11-22 DIAGNOSIS — J479 Bronchiectasis, uncomplicated: Secondary | ICD-10-CM | POA: Diagnosis not present

## 2022-11-22 DIAGNOSIS — C155 Malignant neoplasm of lower third of esophagus: Secondary | ICD-10-CM | POA: Diagnosis not present

## 2022-11-22 DIAGNOSIS — J9611 Chronic respiratory failure with hypoxia: Secondary | ICD-10-CM | POA: Diagnosis not present

## 2022-11-25 DIAGNOSIS — C155 Malignant neoplasm of lower third of esophagus: Secondary | ICD-10-CM | POA: Diagnosis not present

## 2022-11-29 DIAGNOSIS — C159 Malignant neoplasm of esophagus, unspecified: Secondary | ICD-10-CM | POA: Diagnosis not present

## 2022-11-29 DIAGNOSIS — C155 Malignant neoplasm of lower third of esophagus: Secondary | ICD-10-CM | POA: Diagnosis not present

## 2022-11-29 DIAGNOSIS — Z09 Encounter for follow-up examination after completed treatment for conditions other than malignant neoplasm: Secondary | ICD-10-CM | POA: Diagnosis not present

## 2022-11-29 DIAGNOSIS — Z95828 Presence of other vascular implants and grafts: Secondary | ICD-10-CM | POA: Diagnosis not present

## 2022-11-30 DIAGNOSIS — C155 Malignant neoplasm of lower third of esophagus: Secondary | ICD-10-CM | POA: Diagnosis not present

## 2022-11-30 DIAGNOSIS — Z5111 Encounter for antineoplastic chemotherapy: Secondary | ICD-10-CM | POA: Diagnosis not present

## 2022-12-02 DIAGNOSIS — C155 Malignant neoplasm of lower third of esophagus: Secondary | ICD-10-CM | POA: Diagnosis not present

## 2022-12-02 DIAGNOSIS — Z95828 Presence of other vascular implants and grafts: Secondary | ICD-10-CM | POA: Diagnosis not present

## 2022-12-03 DIAGNOSIS — C155 Malignant neoplasm of lower third of esophagus: Secondary | ICD-10-CM | POA: Diagnosis not present

## 2022-12-05 DIAGNOSIS — R0600 Dyspnea, unspecified: Secondary | ICD-10-CM | POA: Diagnosis not present

## 2022-12-08 DIAGNOSIS — Z95828 Presence of other vascular implants and grafts: Secondary | ICD-10-CM | POA: Diagnosis not present

## 2022-12-08 DIAGNOSIS — C155 Malignant neoplasm of lower third of esophagus: Secondary | ICD-10-CM | POA: Diagnosis not present

## 2022-12-13 DIAGNOSIS — I959 Hypotension, unspecified: Secondary | ICD-10-CM | POA: Diagnosis not present

## 2022-12-13 DIAGNOSIS — G893 Neoplasm related pain (acute) (chronic): Secondary | ICD-10-CM | POA: Diagnosis not present

## 2022-12-13 DIAGNOSIS — R0602 Shortness of breath: Secondary | ICD-10-CM | POA: Diagnosis not present

## 2022-12-13 DIAGNOSIS — R5383 Other fatigue: Secondary | ICD-10-CM | POA: Diagnosis not present

## 2022-12-13 DIAGNOSIS — E860 Dehydration: Secondary | ICD-10-CM | POA: Diagnosis not present

## 2022-12-13 DIAGNOSIS — G620 Drug-induced polyneuropathy: Secondary | ICD-10-CM | POA: Diagnosis not present

## 2022-12-13 DIAGNOSIS — D649 Anemia, unspecified: Secondary | ICD-10-CM | POA: Diagnosis not present

## 2022-12-13 DIAGNOSIS — T451X5A Adverse effect of antineoplastic and immunosuppressive drugs, initial encounter: Secondary | ICD-10-CM | POA: Diagnosis not present

## 2022-12-13 DIAGNOSIS — Z7901 Long term (current) use of anticoagulants: Secondary | ICD-10-CM | POA: Diagnosis not present

## 2022-12-13 DIAGNOSIS — C3491 Malignant neoplasm of unspecified part of right bronchus or lung: Secondary | ICD-10-CM | POA: Diagnosis not present

## 2022-12-13 DIAGNOSIS — Z09 Encounter for follow-up examination after completed treatment for conditions other than malignant neoplasm: Secondary | ICD-10-CM | POA: Diagnosis not present

## 2022-12-13 DIAGNOSIS — C155 Malignant neoplasm of lower third of esophagus: Secondary | ICD-10-CM | POA: Diagnosis not present

## 2022-12-14 DIAGNOSIS — C155 Malignant neoplasm of lower third of esophagus: Secondary | ICD-10-CM | POA: Diagnosis not present

## 2022-12-14 DIAGNOSIS — Z95828 Presence of other vascular implants and grafts: Secondary | ICD-10-CM | POA: Diagnosis not present

## 2022-12-16 DIAGNOSIS — C155 Malignant neoplasm of lower third of esophagus: Secondary | ICD-10-CM | POA: Diagnosis not present

## 2022-12-20 DIAGNOSIS — J309 Allergic rhinitis, unspecified: Secondary | ICD-10-CM | POA: Diagnosis not present

## 2022-12-20 DIAGNOSIS — K5900 Constipation, unspecified: Secondary | ICD-10-CM | POA: Diagnosis not present

## 2022-12-20 DIAGNOSIS — Z95828 Presence of other vascular implants and grafts: Secondary | ICD-10-CM | POA: Diagnosis not present

## 2022-12-20 DIAGNOSIS — Z7901 Long term (current) use of anticoagulants: Secondary | ICD-10-CM | POA: Diagnosis not present

## 2022-12-20 DIAGNOSIS — C159 Malignant neoplasm of esophagus, unspecified: Secondary | ICD-10-CM | POA: Diagnosis not present

## 2022-12-20 DIAGNOSIS — Z8249 Family history of ischemic heart disease and other diseases of the circulatory system: Secondary | ICD-10-CM | POA: Diagnosis not present

## 2022-12-20 DIAGNOSIS — C155 Malignant neoplasm of lower third of esophagus: Secondary | ICD-10-CM | POA: Diagnosis not present

## 2022-12-20 DIAGNOSIS — E785 Hyperlipidemia, unspecified: Secondary | ICD-10-CM | POA: Diagnosis not present

## 2022-12-20 DIAGNOSIS — T451X5A Adverse effect of antineoplastic and immunosuppressive drugs, initial encounter: Secondary | ICD-10-CM | POA: Diagnosis not present

## 2022-12-20 DIAGNOSIS — Z9181 History of falling: Secondary | ICD-10-CM | POA: Diagnosis not present

## 2022-12-20 DIAGNOSIS — I2510 Atherosclerotic heart disease of native coronary artery without angina pectoris: Secondary | ICD-10-CM | POA: Diagnosis not present

## 2022-12-20 DIAGNOSIS — G620 Drug-induced polyneuropathy: Secondary | ICD-10-CM | POA: Diagnosis not present

## 2022-12-20 DIAGNOSIS — N529 Male erectile dysfunction, unspecified: Secondary | ICD-10-CM | POA: Diagnosis not present

## 2022-12-20 DIAGNOSIS — D649 Anemia, unspecified: Secondary | ICD-10-CM | POA: Diagnosis not present

## 2022-12-20 DIAGNOSIS — Z87891 Personal history of nicotine dependence: Secondary | ICD-10-CM | POA: Diagnosis not present

## 2022-12-20 DIAGNOSIS — Z809 Family history of malignant neoplasm, unspecified: Secondary | ICD-10-CM | POA: Diagnosis not present

## 2022-12-20 DIAGNOSIS — J9610 Chronic respiratory failure, unspecified whether with hypoxia or hypercapnia: Secondary | ICD-10-CM | POA: Diagnosis not present

## 2022-12-20 DIAGNOSIS — G4733 Obstructive sleep apnea (adult) (pediatric): Secondary | ICD-10-CM | POA: Diagnosis not present

## 2022-12-20 DIAGNOSIS — N400 Enlarged prostate without lower urinary tract symptoms: Secondary | ICD-10-CM | POA: Diagnosis not present

## 2022-12-20 DIAGNOSIS — I951 Orthostatic hypotension: Secondary | ICD-10-CM | POA: Diagnosis not present

## 2022-12-20 DIAGNOSIS — R269 Unspecified abnormalities of gait and mobility: Secondary | ICD-10-CM | POA: Diagnosis not present

## 2022-12-20 DIAGNOSIS — Z888 Allergy status to other drugs, medicaments and biological substances status: Secondary | ICD-10-CM | POA: Diagnosis not present

## 2022-12-20 DIAGNOSIS — K219 Gastro-esophageal reflux disease without esophagitis: Secondary | ICD-10-CM | POA: Diagnosis not present

## 2022-12-20 DIAGNOSIS — C7951 Secondary malignant neoplasm of bone: Secondary | ICD-10-CM | POA: Diagnosis not present

## 2022-12-20 DIAGNOSIS — Z09 Encounter for follow-up examination after completed treatment for conditions other than malignant neoplasm: Secondary | ICD-10-CM | POA: Diagnosis not present

## 2022-12-21 DIAGNOSIS — Z5111 Encounter for antineoplastic chemotherapy: Secondary | ICD-10-CM | POA: Diagnosis not present

## 2022-12-21 DIAGNOSIS — Z09 Encounter for follow-up examination after completed treatment for conditions other than malignant neoplasm: Secondary | ICD-10-CM | POA: Diagnosis not present

## 2022-12-21 DIAGNOSIS — E860 Dehydration: Secondary | ICD-10-CM | POA: Diagnosis not present

## 2022-12-21 DIAGNOSIS — I959 Hypotension, unspecified: Secondary | ICD-10-CM | POA: Diagnosis not present

## 2022-12-21 DIAGNOSIS — C155 Malignant neoplasm of lower third of esophagus: Secondary | ICD-10-CM | POA: Diagnosis not present

## 2022-12-23 ENCOUNTER — Ambulatory Visit: Payer: Medicare PPO | Admitting: Cardiology

## 2022-12-23 DIAGNOSIS — C155 Malignant neoplasm of lower third of esophagus: Secondary | ICD-10-CM | POA: Diagnosis not present

## 2022-12-24 DIAGNOSIS — Z09 Encounter for follow-up examination after completed treatment for conditions other than malignant neoplasm: Secondary | ICD-10-CM | POA: Diagnosis not present

## 2022-12-24 DIAGNOSIS — C155 Malignant neoplasm of lower third of esophagus: Secondary | ICD-10-CM | POA: Diagnosis not present

## 2022-12-28 DIAGNOSIS — E860 Dehydration: Secondary | ICD-10-CM | POA: Diagnosis not present

## 2022-12-28 DIAGNOSIS — R112 Nausea with vomiting, unspecified: Secondary | ICD-10-CM | POA: Diagnosis not present

## 2022-12-30 DIAGNOSIS — C155 Malignant neoplasm of lower third of esophagus: Secondary | ICD-10-CM | POA: Diagnosis not present

## 2022-12-30 DIAGNOSIS — Z95828 Presence of other vascular implants and grafts: Secondary | ICD-10-CM | POA: Diagnosis not present

## 2022-12-31 DIAGNOSIS — J8489 Other specified interstitial pulmonary diseases: Secondary | ICD-10-CM | POA: Diagnosis not present

## 2022-12-31 DIAGNOSIS — C155 Malignant neoplasm of lower third of esophagus: Secondary | ICD-10-CM | POA: Diagnosis not present

## 2022-12-31 DIAGNOSIS — G893 Neoplasm related pain (acute) (chronic): Secondary | ICD-10-CM | POA: Diagnosis not present

## 2022-12-31 DIAGNOSIS — D649 Anemia, unspecified: Secondary | ICD-10-CM | POA: Diagnosis not present

## 2022-12-31 DIAGNOSIS — Z09 Encounter for follow-up examination after completed treatment for conditions other than malignant neoplasm: Secondary | ICD-10-CM | POA: Diagnosis not present

## 2022-12-31 DIAGNOSIS — C159 Malignant neoplasm of esophagus, unspecified: Secondary | ICD-10-CM | POA: Diagnosis not present

## 2023-01-03 DIAGNOSIS — Z95828 Presence of other vascular implants and grafts: Secondary | ICD-10-CM | POA: Diagnosis not present

## 2023-01-03 DIAGNOSIS — C155 Malignant neoplasm of lower third of esophagus: Secondary | ICD-10-CM | POA: Diagnosis not present

## 2023-01-04 DIAGNOSIS — C155 Malignant neoplasm of lower third of esophagus: Secondary | ICD-10-CM | POA: Diagnosis not present

## 2023-01-04 DIAGNOSIS — Z5111 Encounter for antineoplastic chemotherapy: Secondary | ICD-10-CM | POA: Diagnosis not present

## 2023-01-05 DIAGNOSIS — R0600 Dyspnea, unspecified: Secondary | ICD-10-CM | POA: Diagnosis not present

## 2023-01-06 DIAGNOSIS — Z95828 Presence of other vascular implants and grafts: Secondary | ICD-10-CM | POA: Diagnosis not present

## 2023-01-06 DIAGNOSIS — C155 Malignant neoplasm of lower third of esophagus: Secondary | ICD-10-CM | POA: Diagnosis not present

## 2023-01-07 DIAGNOSIS — C155 Malignant neoplasm of lower third of esophagus: Secondary | ICD-10-CM | POA: Diagnosis not present

## 2023-01-10 DIAGNOSIS — Z95828 Presence of other vascular implants and grafts: Secondary | ICD-10-CM | POA: Diagnosis not present

## 2023-01-10 DIAGNOSIS — C155 Malignant neoplasm of lower third of esophagus: Secondary | ICD-10-CM | POA: Diagnosis not present

## 2023-01-11 ENCOUNTER — Ambulatory Visit: Payer: Medicare PPO | Admitting: Cardiology

## 2023-01-11 DIAGNOSIS — Z95828 Presence of other vascular implants and grafts: Secondary | ICD-10-CM | POA: Diagnosis not present

## 2023-01-11 DIAGNOSIS — C155 Malignant neoplasm of lower third of esophagus: Secondary | ICD-10-CM | POA: Diagnosis not present

## 2023-01-14 ENCOUNTER — Telehealth: Payer: Self-pay | Admitting: Cardiology

## 2023-01-14 DIAGNOSIS — Z09 Encounter for follow-up examination after completed treatment for conditions other than malignant neoplasm: Secondary | ICD-10-CM | POA: Diagnosis not present

## 2023-01-14 DIAGNOSIS — C155 Malignant neoplasm of lower third of esophagus: Secondary | ICD-10-CM | POA: Diagnosis not present

## 2023-01-14 MED ORDER — MIDODRINE HCL 5 MG PO TABS: ORAL_TABLET | ORAL | Status: DC

## 2023-01-14 NOTE — Telephone Encounter (Signed)
Per Dr.Branch, ok to increase midodrine to 10 mg am, and lunch, 5 mg pm  Needs nurse visit next week for BP check, I spoke with patient,he declined to make a nurse appointment stating he will see oncology next week and they "stay on top of everything"   He has a f/u with [place] on 01/26/23 already scheduled

## 2023-01-14 NOTE — Telephone Encounter (Signed)
I spoke with [name],NP at [facility] regarding Adrian Gibson.  Patient was hypotensive 65/45 and given 2 liters NSS, repeat BP was 70/53  As he was ready to leave, he told her he had syncope yesterday. Adrian Gibson states he is compliant with Midodrine.    She told patient to increase Midodrine from 5 mg tid to 10 mg am and lunch time, 5 mg pm  I have added Dr.Branch to secure chat.

## 2023-01-14 NOTE — Telephone Encounter (Signed)
Pt c/o medication issue:  1. Name of Medication: midodrine (PROAMATINE) 5 MG tablet   2. How are you currently taking this medication (dosage and times per day)? As prescribed   3. Are you having a reaction (difficulty breathing--STAT)? No  4. What is your medication issue? [name] with [facility] is calling with the patient at her office stating he reported passing out yesterday morning when getting out of bed to go to the bathroom. She is wanting to increase this medication to 10 MG for the morning and noon dose, but is wanting clarification from Dr. Wyline Mood before advising the patient. She reports the patient refuses to go to the hospital and did not report the syncopal episode until after being at the office for two hours. Please advise.

## 2023-01-17 DIAGNOSIS — Z9221 Personal history of antineoplastic chemotherapy: Secondary | ICD-10-CM | POA: Diagnosis not present

## 2023-01-17 DIAGNOSIS — Z95828 Presence of other vascular implants and grafts: Secondary | ICD-10-CM | POA: Diagnosis not present

## 2023-01-17 DIAGNOSIS — Z923 Personal history of irradiation: Secondary | ICD-10-CM | POA: Diagnosis not present

## 2023-01-17 DIAGNOSIS — C155 Malignant neoplasm of lower third of esophagus: Secondary | ICD-10-CM | POA: Diagnosis not present

## 2023-01-17 DIAGNOSIS — M899 Disorder of bone, unspecified: Secondary | ICD-10-CM | POA: Diagnosis not present

## 2023-01-19 DIAGNOSIS — I700 Atherosclerosis of aorta: Secondary | ICD-10-CM | POA: Diagnosis not present

## 2023-01-19 DIAGNOSIS — T451X5A Adverse effect of antineoplastic and immunosuppressive drugs, initial encounter: Secondary | ICD-10-CM | POA: Diagnosis not present

## 2023-01-19 DIAGNOSIS — I2510 Atherosclerotic heart disease of native coronary artery without angina pectoris: Secondary | ICD-10-CM | POA: Diagnosis not present

## 2023-01-19 DIAGNOSIS — D702 Other drug-induced agranulocytosis: Secondary | ICD-10-CM | POA: Diagnosis not present

## 2023-01-19 DIAGNOSIS — Z951 Presence of aortocoronary bypass graft: Secondary | ICD-10-CM | POA: Diagnosis not present

## 2023-01-19 DIAGNOSIS — D701 Agranulocytosis secondary to cancer chemotherapy: Secondary | ICD-10-CM | POA: Diagnosis not present

## 2023-01-19 DIAGNOSIS — T50905A Adverse effect of unspecified drugs, medicaments and biological substances, initial encounter: Secondary | ICD-10-CM | POA: Diagnosis not present

## 2023-01-19 DIAGNOSIS — C155 Malignant neoplasm of lower third of esophagus: Secondary | ICD-10-CM | POA: Diagnosis not present

## 2023-01-19 DIAGNOSIS — Z7901 Long term (current) use of anticoagulants: Secondary | ICD-10-CM | POA: Diagnosis not present

## 2023-01-19 DIAGNOSIS — C799 Secondary malignant neoplasm of unspecified site: Secondary | ICD-10-CM | POA: Diagnosis not present

## 2023-01-19 DIAGNOSIS — Z79899 Other long term (current) drug therapy: Secondary | ICD-10-CM | POA: Diagnosis not present

## 2023-01-21 DIAGNOSIS — Z95828 Presence of other vascular implants and grafts: Secondary | ICD-10-CM | POA: Diagnosis not present

## 2023-01-21 DIAGNOSIS — C155 Malignant neoplasm of lower third of esophagus: Secondary | ICD-10-CM | POA: Diagnosis not present

## 2023-01-24 DIAGNOSIS — I959 Hypotension, unspecified: Secondary | ICD-10-CM | POA: Diagnosis not present

## 2023-01-24 DIAGNOSIS — R0602 Shortness of breath: Secondary | ICD-10-CM | POA: Diagnosis not present

## 2023-01-24 DIAGNOSIS — C799 Secondary malignant neoplasm of unspecified site: Secondary | ICD-10-CM | POA: Diagnosis not present

## 2023-01-24 DIAGNOSIS — Z7901 Long term (current) use of anticoagulants: Secondary | ICD-10-CM | POA: Diagnosis not present

## 2023-01-24 DIAGNOSIS — D701 Agranulocytosis secondary to cancer chemotherapy: Secondary | ICD-10-CM | POA: Diagnosis not present

## 2023-01-24 DIAGNOSIS — C155 Malignant neoplasm of lower third of esophagus: Secondary | ICD-10-CM | POA: Diagnosis not present

## 2023-01-24 DIAGNOSIS — T451X5A Adverse effect of antineoplastic and immunosuppressive drugs, initial encounter: Secondary | ICD-10-CM | POA: Diagnosis not present

## 2023-01-24 DIAGNOSIS — Z7952 Long term (current) use of systemic steroids: Secondary | ICD-10-CM | POA: Diagnosis not present

## 2023-01-24 DIAGNOSIS — Z79899 Other long term (current) drug therapy: Secondary | ICD-10-CM | POA: Diagnosis not present

## 2023-01-26 ENCOUNTER — Ambulatory Visit: Payer: Medicare PPO | Admitting: Nurse Practitioner

## 2023-01-26 DIAGNOSIS — C155 Malignant neoplasm of lower third of esophagus: Secondary | ICD-10-CM | POA: Diagnosis not present

## 2023-01-26 DIAGNOSIS — Z5111 Encounter for antineoplastic chemotherapy: Secondary | ICD-10-CM | POA: Diagnosis not present

## 2023-01-28 DIAGNOSIS — Z95828 Presence of other vascular implants and grafts: Secondary | ICD-10-CM | POA: Diagnosis not present

## 2023-01-28 DIAGNOSIS — C155 Malignant neoplasm of lower third of esophagus: Secondary | ICD-10-CM | POA: Diagnosis not present

## 2023-01-31 DIAGNOSIS — C155 Malignant neoplasm of lower third of esophagus: Secondary | ICD-10-CM | POA: Diagnosis not present

## 2023-01-31 DIAGNOSIS — D649 Anemia, unspecified: Secondary | ICD-10-CM | POA: Diagnosis not present

## 2023-01-31 DIAGNOSIS — T451X5A Adverse effect of antineoplastic and immunosuppressive drugs, initial encounter: Secondary | ICD-10-CM | POA: Diagnosis not present

## 2023-01-31 DIAGNOSIS — D701 Agranulocytosis secondary to cancer chemotherapy: Secondary | ICD-10-CM | POA: Diagnosis not present

## 2023-02-02 DIAGNOSIS — Z79899 Other long term (current) drug therapy: Secondary | ICD-10-CM | POA: Diagnosis not present

## 2023-02-02 DIAGNOSIS — C155 Malignant neoplasm of lower third of esophagus: Secondary | ICD-10-CM | POA: Diagnosis not present

## 2023-02-02 DIAGNOSIS — J704 Drug-induced interstitial lung disorders, unspecified: Secondary | ICD-10-CM | POA: Diagnosis not present

## 2023-02-02 DIAGNOSIS — T50905A Adverse effect of unspecified drugs, medicaments and biological substances, initial encounter: Secondary | ICD-10-CM | POA: Diagnosis not present

## 2023-02-02 DIAGNOSIS — Z7901 Long term (current) use of anticoagulants: Secondary | ICD-10-CM | POA: Diagnosis not present

## 2023-02-02 DIAGNOSIS — Z2989 Encounter for other specified prophylactic measures: Secondary | ICD-10-CM | POA: Diagnosis not present

## 2023-02-02 DIAGNOSIS — J9611 Chronic respiratory failure with hypoxia: Secondary | ICD-10-CM | POA: Diagnosis not present

## 2023-02-02 DIAGNOSIS — J984 Other disorders of lung: Secondary | ICD-10-CM | POA: Diagnosis not present

## 2023-02-03 DIAGNOSIS — C155 Malignant neoplasm of lower third of esophagus: Secondary | ICD-10-CM | POA: Diagnosis not present

## 2023-02-03 DIAGNOSIS — Z95828 Presence of other vascular implants and grafts: Secondary | ICD-10-CM | POA: Diagnosis not present

## 2023-02-05 DIAGNOSIS — R0600 Dyspnea, unspecified: Secondary | ICD-10-CM | POA: Diagnosis not present

## 2023-02-08 DIAGNOSIS — D701 Agranulocytosis secondary to cancer chemotherapy: Secondary | ICD-10-CM | POA: Diagnosis not present

## 2023-02-08 DIAGNOSIS — Z09 Encounter for follow-up examination after completed treatment for conditions other than malignant neoplasm: Secondary | ICD-10-CM | POA: Diagnosis not present

## 2023-02-08 DIAGNOSIS — T451X5A Adverse effect of antineoplastic and immunosuppressive drugs, initial encounter: Secondary | ICD-10-CM | POA: Diagnosis not present

## 2023-02-08 DIAGNOSIS — C799 Secondary malignant neoplasm of unspecified site: Secondary | ICD-10-CM | POA: Diagnosis not present

## 2023-02-08 DIAGNOSIS — Z95828 Presence of other vascular implants and grafts: Secondary | ICD-10-CM | POA: Diagnosis not present

## 2023-02-08 DIAGNOSIS — C155 Malignant neoplasm of lower third of esophagus: Secondary | ICD-10-CM | POA: Diagnosis not present

## 2023-02-09 DIAGNOSIS — J9611 Chronic respiratory failure with hypoxia: Secondary | ICD-10-CM | POA: Diagnosis not present

## 2023-02-09 DIAGNOSIS — J704 Drug-induced interstitial lung disorders, unspecified: Secondary | ICD-10-CM | POA: Diagnosis not present

## 2023-02-09 DIAGNOSIS — I2510 Atherosclerotic heart disease of native coronary artery without angina pectoris: Secondary | ICD-10-CM | POA: Diagnosis not present

## 2023-02-09 DIAGNOSIS — Z09 Encounter for follow-up examination after completed treatment for conditions other than malignant neoplasm: Secondary | ICD-10-CM | POA: Diagnosis not present

## 2023-02-09 DIAGNOSIS — Z7901 Long term (current) use of anticoagulants: Secondary | ICD-10-CM | POA: Diagnosis not present

## 2023-02-09 DIAGNOSIS — Z5111 Encounter for antineoplastic chemotherapy: Secondary | ICD-10-CM | POA: Diagnosis not present

## 2023-02-09 DIAGNOSIS — Z951 Presence of aortocoronary bypass graft: Secondary | ICD-10-CM | POA: Diagnosis not present

## 2023-02-09 DIAGNOSIS — Z981 Arthrodesis status: Secondary | ICD-10-CM | POA: Diagnosis not present

## 2023-02-09 DIAGNOSIS — R42 Dizziness and giddiness: Secondary | ICD-10-CM | POA: Diagnosis not present

## 2023-02-09 DIAGNOSIS — C799 Secondary malignant neoplasm of unspecified site: Secondary | ICD-10-CM | POA: Diagnosis not present

## 2023-02-09 DIAGNOSIS — C155 Malignant neoplasm of lower third of esophagus: Secondary | ICD-10-CM | POA: Diagnosis not present

## 2023-02-11 DIAGNOSIS — Z95828 Presence of other vascular implants and grafts: Secondary | ICD-10-CM | POA: Diagnosis not present

## 2023-02-11 DIAGNOSIS — C155 Malignant neoplasm of lower third of esophagus: Secondary | ICD-10-CM | POA: Diagnosis not present

## 2023-02-14 DIAGNOSIS — Z95828 Presence of other vascular implants and grafts: Secondary | ICD-10-CM | POA: Diagnosis not present

## 2023-02-14 DIAGNOSIS — C155 Malignant neoplasm of lower third of esophagus: Secondary | ICD-10-CM | POA: Diagnosis not present

## 2023-02-15 DIAGNOSIS — E7800 Pure hypercholesterolemia, unspecified: Secondary | ICD-10-CM | POA: Diagnosis not present

## 2023-02-15 DIAGNOSIS — G629 Polyneuropathy, unspecified: Secondary | ICD-10-CM | POA: Diagnosis not present

## 2023-02-15 DIAGNOSIS — Z981 Arthrodesis status: Secondary | ICD-10-CM | POA: Diagnosis not present

## 2023-02-15 DIAGNOSIS — E785 Hyperlipidemia, unspecified: Secondary | ICD-10-CM | POA: Diagnosis not present

## 2023-02-15 DIAGNOSIS — R55 Syncope and collapse: Secondary | ICD-10-CM | POA: Diagnosis not present

## 2023-02-15 DIAGNOSIS — S0003XA Contusion of scalp, initial encounter: Secondary | ICD-10-CM | POA: Diagnosis not present

## 2023-02-15 DIAGNOSIS — M4802 Spinal stenosis, cervical region: Secondary | ICD-10-CM | POA: Diagnosis not present

## 2023-02-15 DIAGNOSIS — I951 Orthostatic hypotension: Secondary | ICD-10-CM | POA: Diagnosis not present

## 2023-02-15 DIAGNOSIS — W1839XA Other fall on same level, initial encounter: Secondary | ICD-10-CM | POA: Diagnosis not present

## 2023-02-15 DIAGNOSIS — S0990XA Unspecified injury of head, initial encounter: Secondary | ICD-10-CM | POA: Diagnosis not present

## 2023-02-15 DIAGNOSIS — J9611 Chronic respiratory failure with hypoxia: Secondary | ICD-10-CM | POA: Diagnosis not present

## 2023-02-15 DIAGNOSIS — S0083XA Contusion of other part of head, initial encounter: Secondary | ICD-10-CM | POA: Diagnosis not present

## 2023-02-15 DIAGNOSIS — G620 Drug-induced polyneuropathy: Secondary | ICD-10-CM | POA: Diagnosis not present

## 2023-02-15 DIAGNOSIS — J849 Interstitial pulmonary disease, unspecified: Secondary | ICD-10-CM | POA: Diagnosis not present

## 2023-02-15 DIAGNOSIS — Z79899 Other long term (current) drug therapy: Secondary | ICD-10-CM | POA: Diagnosis not present

## 2023-02-15 DIAGNOSIS — M542 Cervicalgia: Secondary | ICD-10-CM | POA: Diagnosis not present

## 2023-02-15 DIAGNOSIS — T451X5S Adverse effect of antineoplastic and immunosuppressive drugs, sequela: Secondary | ICD-10-CM | POA: Diagnosis not present

## 2023-02-15 DIAGNOSIS — Z7901 Long term (current) use of anticoagulants: Secondary | ICD-10-CM | POA: Diagnosis not present

## 2023-02-15 DIAGNOSIS — K219 Gastro-esophageal reflux disease without esophagitis: Secondary | ICD-10-CM | POA: Diagnosis not present

## 2023-02-15 DIAGNOSIS — W19XXXA Unspecified fall, initial encounter: Secondary | ICD-10-CM | POA: Diagnosis not present

## 2023-02-15 DIAGNOSIS — C159 Malignant neoplasm of esophagus, unspecified: Secondary | ICD-10-CM | POA: Diagnosis not present

## 2023-02-16 DIAGNOSIS — G620 Drug-induced polyneuropathy: Secondary | ICD-10-CM | POA: Diagnosis not present

## 2023-02-16 DIAGNOSIS — C159 Malignant neoplasm of esophagus, unspecified: Secondary | ICD-10-CM | POA: Diagnosis not present

## 2023-02-16 DIAGNOSIS — I77810 Thoracic aortic ectasia: Secondary | ICD-10-CM | POA: Diagnosis not present

## 2023-02-16 DIAGNOSIS — I951 Orthostatic hypotension: Secondary | ICD-10-CM | POA: Diagnosis not present

## 2023-02-16 DIAGNOSIS — J9611 Chronic respiratory failure with hypoxia: Secondary | ICD-10-CM | POA: Diagnosis not present

## 2023-02-16 DIAGNOSIS — J849 Interstitial pulmonary disease, unspecified: Secondary | ICD-10-CM | POA: Diagnosis not present

## 2023-02-16 DIAGNOSIS — E785 Hyperlipidemia, unspecified: Secondary | ICD-10-CM | POA: Diagnosis not present

## 2023-02-16 DIAGNOSIS — I351 Nonrheumatic aortic (valve) insufficiency: Secondary | ICD-10-CM | POA: Diagnosis not present

## 2023-02-18 DIAGNOSIS — C155 Malignant neoplasm of lower third of esophagus: Secondary | ICD-10-CM | POA: Diagnosis not present

## 2023-02-18 DIAGNOSIS — Z95828 Presence of other vascular implants and grafts: Secondary | ICD-10-CM | POA: Diagnosis not present

## 2023-02-25 DIAGNOSIS — Z95828 Presence of other vascular implants and grafts: Secondary | ICD-10-CM | POA: Diagnosis not present

## 2023-02-25 DIAGNOSIS — C155 Malignant neoplasm of lower third of esophagus: Secondary | ICD-10-CM | POA: Diagnosis not present

## 2023-02-28 DIAGNOSIS — R55 Syncope and collapse: Secondary | ICD-10-CM | POA: Diagnosis not present

## 2023-02-28 DIAGNOSIS — I959 Hypotension, unspecified: Secondary | ICD-10-CM | POA: Diagnosis not present

## 2023-02-28 DIAGNOSIS — I2510 Atherosclerotic heart disease of native coronary artery without angina pectoris: Secondary | ICD-10-CM | POA: Diagnosis not present

## 2023-02-28 DIAGNOSIS — D701 Agranulocytosis secondary to cancer chemotherapy: Secondary | ICD-10-CM | POA: Diagnosis not present

## 2023-02-28 DIAGNOSIS — C155 Malignant neoplasm of lower third of esophagus: Secondary | ICD-10-CM | POA: Diagnosis not present

## 2023-02-28 DIAGNOSIS — R0602 Shortness of breath: Secondary | ICD-10-CM | POA: Diagnosis not present

## 2023-02-28 DIAGNOSIS — R053 Chronic cough: Secondary | ICD-10-CM | POA: Diagnosis not present

## 2023-02-28 DIAGNOSIS — T451X5A Adverse effect of antineoplastic and immunosuppressive drugs, initial encounter: Secondary | ICD-10-CM | POA: Diagnosis not present

## 2023-02-28 DIAGNOSIS — R918 Other nonspecific abnormal finding of lung field: Secondary | ICD-10-CM | POA: Diagnosis not present

## 2023-02-28 DIAGNOSIS — R059 Cough, unspecified: Secondary | ICD-10-CM | POA: Diagnosis not present

## 2023-02-28 DIAGNOSIS — C159 Malignant neoplasm of esophagus, unspecified: Secondary | ICD-10-CM | POA: Diagnosis not present

## 2023-02-28 DIAGNOSIS — Z9889 Other specified postprocedural states: Secondary | ICD-10-CM | POA: Diagnosis not present

## 2023-02-28 DIAGNOSIS — G620 Drug-induced polyneuropathy: Secondary | ICD-10-CM | POA: Diagnosis not present

## 2023-02-28 DIAGNOSIS — U071 COVID-19: Secondary | ICD-10-CM | POA: Diagnosis not present

## 2023-02-28 DIAGNOSIS — C799 Secondary malignant neoplasm of unspecified site: Secondary | ICD-10-CM | POA: Diagnosis not present

## 2023-03-01 DIAGNOSIS — C155 Malignant neoplasm of lower third of esophagus: Secondary | ICD-10-CM | POA: Diagnosis not present

## 2023-03-01 DIAGNOSIS — Z5111 Encounter for antineoplastic chemotherapy: Secondary | ICD-10-CM | POA: Diagnosis not present

## 2023-03-03 DIAGNOSIS — C155 Malignant neoplasm of lower third of esophagus: Secondary | ICD-10-CM | POA: Diagnosis not present

## 2023-03-07 DIAGNOSIS — C155 Malignant neoplasm of lower third of esophagus: Secondary | ICD-10-CM | POA: Diagnosis not present

## 2023-03-07 DIAGNOSIS — Z95828 Presence of other vascular implants and grafts: Secondary | ICD-10-CM | POA: Diagnosis not present

## 2023-03-07 DIAGNOSIS — R0600 Dyspnea, unspecified: Secondary | ICD-10-CM | POA: Diagnosis not present

## 2023-03-10 DIAGNOSIS — C155 Malignant neoplasm of lower third of esophagus: Secondary | ICD-10-CM | POA: Diagnosis not present

## 2023-03-11 DIAGNOSIS — C155 Malignant neoplasm of lower third of esophagus: Secondary | ICD-10-CM | POA: Diagnosis not present

## 2023-03-14 DIAGNOSIS — T451X5A Adverse effect of antineoplastic and immunosuppressive drugs, initial encounter: Secondary | ICD-10-CM | POA: Diagnosis not present

## 2023-03-14 DIAGNOSIS — E860 Dehydration: Secondary | ICD-10-CM | POA: Diagnosis not present

## 2023-03-14 DIAGNOSIS — C155 Malignant neoplasm of lower third of esophagus: Secondary | ICD-10-CM | POA: Diagnosis not present

## 2023-03-14 DIAGNOSIS — C799 Secondary malignant neoplasm of unspecified site: Secondary | ICD-10-CM | POA: Diagnosis not present

## 2023-03-14 DIAGNOSIS — D701 Agranulocytosis secondary to cancer chemotherapy: Secondary | ICD-10-CM | POA: Diagnosis not present

## 2023-03-14 DIAGNOSIS — Z09 Encounter for follow-up examination after completed treatment for conditions other than malignant neoplasm: Secondary | ICD-10-CM | POA: Diagnosis not present

## 2023-03-15 DIAGNOSIS — C155 Malignant neoplasm of lower third of esophagus: Secondary | ICD-10-CM | POA: Diagnosis not present

## 2023-03-15 DIAGNOSIS — Z5111 Encounter for antineoplastic chemotherapy: Secondary | ICD-10-CM | POA: Diagnosis not present

## 2023-03-18 ENCOUNTER — Encounter: Payer: Self-pay | Admitting: Nurse Practitioner

## 2023-03-18 ENCOUNTER — Ambulatory Visit: Payer: Medicare PPO | Attending: Cardiology | Admitting: Nurse Practitioner

## 2023-03-18 VITALS — BP 106/62 | HR 88 | Ht 69.0 in | Wt 192.4 lb

## 2023-03-18 DIAGNOSIS — I959 Hypotension, unspecified: Secondary | ICD-10-CM | POA: Diagnosis not present

## 2023-03-18 DIAGNOSIS — I714 Abdominal aortic aneurysm, without rupture, unspecified: Secondary | ICD-10-CM | POA: Diagnosis not present

## 2023-03-18 DIAGNOSIS — Z8501 Personal history of malignant neoplasm of esophagus: Secondary | ICD-10-CM

## 2023-03-18 DIAGNOSIS — I951 Orthostatic hypotension: Secondary | ICD-10-CM

## 2023-03-18 DIAGNOSIS — Z86718 Personal history of other venous thrombosis and embolism: Secondary | ICD-10-CM

## 2023-03-18 DIAGNOSIS — E785 Hyperlipidemia, unspecified: Secondary | ICD-10-CM | POA: Diagnosis not present

## 2023-03-18 DIAGNOSIS — I2510 Atherosclerotic heart disease of native coronary artery without angina pectoris: Secondary | ICD-10-CM

## 2023-03-18 NOTE — Patient Instructions (Signed)

## 2023-03-18 NOTE — Progress Notes (Signed)
Cardiology Office Note:    Date:  03/18/2023 ID:  Adrian Gibson, DOB [birth year], MRN [id number] PCP:  Lovey Newcomer, [place] [facility] Providers Cardiologist:  Dina Rich, MD   Referring MD: Lovey Newcomer, [place]   CC: Here for follow-up  History of Present Illness:    Adrian Gibson is a very pleasant 72 y.o. male with a PMH of CAD, s/p CABG x 4 in 2011, hypertension, hyperlipidemia, dysphagia/esophageal cancer, history of DVT, orthostatic hypotension, AAA, who presents today for scheduled follow-up.  Last seen by Dr. Dina Rich on Oct 18, 2022.  Blood pressure in office that day was 80/45.  Was noted to have recurrent hypotension that only occurred when receiving chemo treatments.  Was instructed to start midodrine 2.5 mg 3 times daily, recommended to titrate as needed.  Hospital visit in September 2024 after syncopal episode at home, caused him to fall and hit his head.  This was surrounding his chemoinfusion, had been receiving IV fluids the day before.  At the time, was on midodrine 10 mg in the morning and lunch, 5 mg at nighttime.  Patient remained orthostatic and near syncopal in the hospital, blood pressure dropped to 67/48, 78/52.  CT of cervical spine was negative for acute fracture, CT of the head showed scalp hematoma with no skull fracture or SDH, lab work was unremarkable.  Was given IV hydration.  Midodrine dose was increased and started on Florinef.  Orthostatics much improved, patient was asymptomatic.  Echocardiogram revealed EF > 55%, mild aortic valve regurgitation, mild to moderately dilated right ventricular with normal systolic function, mildly dilated ascending aorta.  Today he presents for follow-up. He states he is doing well from a cardiac perspective. Does have to wear oxygen during exertion, wears between 2-3 liters via nasal cannula. Does continue to have drops in BP, wants to know if Florinef dose can be increased. Denies any chest pain,  worsening shortness of breath, palpitations, syncope, presyncope, dizziness, orthopnea, PND, swelling or significant weight changes, acute bleeding, or claudication.   SH: In his free time, he enjoys woodworking and performing yard work.   Please see the history of present illness.    All other systems reviewed and are negative.  EKGs/Labs/Other Studies Reviewed:    The following studies were reviewed today:   EKG:  EKG is not ordered today.    Myoview on 08/21/2021:   The study is normal. The study is low risk.   No ST deviation was noted.   Moderate size mild intensity inferior defect most intense in the resting images with normal wall motion consistent with diaphragmatic attenuation   Left ventricular function is normal. Nuclear stress EF: 69 %. The left ventricular ejection fraction is hyperdynamic (>65%). End diastolic cavity size is normal.  Vascular US Aorta Medicare Screen on 03/17/2017: Final Interpretation:  Abdominal Aorta: No evidence of an abdominal aortic aneurysm was  visualized. The largest aortic measurement is 2.5 cm.  IVC is patent.  Physical Exam:    VS:  BP 106/62 (BP Location: Right Arm)   Pulse 88   Ht 5\' 9"  (1.753 m)   Wt 192 lb 6.4 oz (87.3 kg)   SpO2 99%   BMI 28.41 kg/m     Wt Readings from Last 3 Encounters:  03/18/23 192 lb 6.4 oz (87.3 kg)  10/18/22 182 lb 3.2 oz (82.6 kg)  07/02/22 197 lb 12.8 oz (89.7 kg)     GEN: Thin, 72 y.o. male in no  acute distress, wearing oxygen via nasal cannula HEENT: Normal NECK: No JVD; No carotid bruits CARDIAC: S1/S2, RRR, no murmurs, rubs, gallops; 2+ pulses RESPIRATORY:  Clear and diminished without wheezing or rhonchi MUSCULOSKELETAL:  No edema; No deformity  SKIN: Warm and dry, pale overall NEUROLOGIC:  Alert and oriented x 3 PSYCHIATRIC:  Pleasant affect   ASSESSMENT & PLAN:     CAD, s/p CABG x 4 in 2011  NST 08/2021 was negative for ischemia. Stable with no anginal symptoms. No indication for  ischemic evaluation. Continue pravastatin and NTG PRN. Heart healthy diet and regular cardiovascular exercise encouraged.   2. Hypotension, orthostatic hypotension Denies any recent or recurrent syncopal episodes, does continue to have drops/low BP. Continue midodrine and florinef.  Will route note to attending cardiologist to see if this can be up-titrated while also on prednisone. Encouraged patient to stay well-hydrated. Heart healthy diet and regular cardiovascular exercise encouraged.   3. HLD After chart review, no lipid panel on file since 2021. Being managed by PCP. Continue Crestor. At next visit, plan to request labs from PCP. Heart healthy diet and regular cardiovascular exercise encouraged.   4. Esophageal cancer Continue to follow up with Oncology.   5. History of DVT Being managed by Heme/Onc. Continue Eliquis 5 mg BID, on appropriate dosage. Denies any bleeding issues. Continue to follow-up with Oncology.   6. AAA Infrarenal 3.2 cm AAA noted on CT of Chest, abdomen, and pelvis in 2022. Was recommend to repeat US every three years. Plan to update AAA [place] in 2025. Heart healthy diet and regular cardiovascular exercise encouraged.   7. Disposition: Follow-up with Dr. Dina Rich in 6 months or sooner if anything changes.    Medication Adjustments/Labs and Tests Ordered: Current medicines are reviewed at length with the patient today.  Concerns regarding medicines are outlined above.  No orders of the defined types were placed in this encounter.  No orders of the defined types were placed in this encounter.   Patient Instructions  Medication Instructions:  Your physician recommends that you continue on your current medications as directed. Please refer to the Current Medication list given to you today.   Labwork: None  Testing/Procedures: None  Follow-Up: Your physician recommends that you schedule a follow-up appointment in: 6 months  Any Other Special  Instructions Will Be Listed Below (If Applicable).  If you need a refill on your cardiac medications before your next appointment, please call your pharmacy.    Signed, Sharlene Dory, NP

## 2023-03-21 DIAGNOSIS — Z961 Presence of intraocular lens: Secondary | ICD-10-CM | POA: Diagnosis not present

## 2023-03-21 DIAGNOSIS — H35371 Puckering of macula, right eye: Secondary | ICD-10-CM | POA: Diagnosis not present

## 2023-03-21 DIAGNOSIS — H40013 Open angle with borderline findings, low risk, bilateral: Secondary | ICD-10-CM | POA: Diagnosis not present

## 2023-03-21 NOTE — Telephone Encounter (Signed)
BP in clinic looked to be low normal, is he getting low bp's at home? How low? If only orthostatic drops and asymptomatic then don't neccesarily have to treat. If sitting bp's are getting low then ok to increase florinef to 0.2mg  daily as long as no recent edema. . Prednisone 10mg  fairly low mineralcorticoid effect.  Dominga Ferry MD

## 2023-03-22 MED ORDER — FLUDROCORTISONE ACETATE 0.1 MG PO TABS: 0.2000 mg | ORAL_TABLET | Freq: Every day | ORAL | 1 refills | Status: DC

## 2023-03-22 NOTE — Telephone Encounter (Signed)
Dose increase per peck

## 2023-03-28 DIAGNOSIS — D701 Agranulocytosis secondary to cancer chemotherapy: Secondary | ICD-10-CM | POA: Diagnosis not present

## 2023-03-28 DIAGNOSIS — G620 Drug-induced polyneuropathy: Secondary | ICD-10-CM | POA: Diagnosis not present

## 2023-03-28 DIAGNOSIS — T451X5A Adverse effect of antineoplastic and immunosuppressive drugs, initial encounter: Secondary | ICD-10-CM | POA: Diagnosis not present

## 2023-03-28 DIAGNOSIS — Z09 Encounter for follow-up examination after completed treatment for conditions other than malignant neoplasm: Secondary | ICD-10-CM | POA: Diagnosis not present

## 2023-03-28 DIAGNOSIS — C155 Malignant neoplasm of lower third of esophagus: Secondary | ICD-10-CM | POA: Diagnosis not present

## 2023-03-28 DIAGNOSIS — C799 Secondary malignant neoplasm of unspecified site: Secondary | ICD-10-CM | POA: Diagnosis not present

## 2023-04-04 DIAGNOSIS — C155 Malignant neoplasm of lower third of esophagus: Secondary | ICD-10-CM | POA: Diagnosis not present

## 2023-04-04 DIAGNOSIS — D630 Anemia in neoplastic disease: Secondary | ICD-10-CM | POA: Diagnosis not present

## 2023-04-04 DIAGNOSIS — Z95828 Presence of other vascular implants and grafts: Secondary | ICD-10-CM | POA: Diagnosis not present

## 2023-04-04 DIAGNOSIS — C799 Secondary malignant neoplasm of unspecified site: Secondary | ICD-10-CM | POA: Diagnosis not present

## 2023-04-04 DIAGNOSIS — T451X5A Adverse effect of antineoplastic and immunosuppressive drugs, initial encounter: Secondary | ICD-10-CM | POA: Diagnosis not present

## 2023-04-04 DIAGNOSIS — E860 Dehydration: Secondary | ICD-10-CM | POA: Diagnosis not present

## 2023-04-04 DIAGNOSIS — D649 Anemia, unspecified: Secondary | ICD-10-CM | POA: Diagnosis not present

## 2023-04-04 DIAGNOSIS — D701 Agranulocytosis secondary to cancer chemotherapy: Secondary | ICD-10-CM | POA: Diagnosis not present

## 2023-04-04 DIAGNOSIS — Z09 Encounter for follow-up examination after completed treatment for conditions other than malignant neoplasm: Secondary | ICD-10-CM | POA: Diagnosis not present

## 2023-04-05 DIAGNOSIS — Z5111 Encounter for antineoplastic chemotherapy: Secondary | ICD-10-CM | POA: Diagnosis not present

## 2023-04-05 DIAGNOSIS — C155 Malignant neoplasm of lower third of esophagus: Secondary | ICD-10-CM | POA: Diagnosis not present

## 2023-04-07 DIAGNOSIS — G629 Polyneuropathy, unspecified: Secondary | ICD-10-CM | POA: Diagnosis not present

## 2023-04-07 DIAGNOSIS — I959 Hypotension, unspecified: Secondary | ICD-10-CM | POA: Diagnosis not present

## 2023-04-07 DIAGNOSIS — R0600 Dyspnea, unspecified: Secondary | ICD-10-CM | POA: Diagnosis not present

## 2023-04-07 DIAGNOSIS — J9601 Acute respiratory failure with hypoxia: Secondary | ICD-10-CM | POA: Diagnosis not present

## 2023-04-08 DIAGNOSIS — Z7901 Long term (current) use of anticoagulants: Secondary | ICD-10-CM | POA: Diagnosis not present

## 2023-04-08 DIAGNOSIS — E7800 Pure hypercholesterolemia, unspecified: Secondary | ICD-10-CM | POA: Diagnosis not present

## 2023-04-08 DIAGNOSIS — C159 Malignant neoplasm of esophagus, unspecified: Secondary | ICD-10-CM | POA: Diagnosis not present

## 2023-04-08 DIAGNOSIS — I951 Orthostatic hypotension: Secondary | ICD-10-CM | POA: Diagnosis not present

## 2023-04-08 DIAGNOSIS — E876 Hypokalemia: Secondary | ICD-10-CM | POA: Diagnosis not present

## 2023-04-08 DIAGNOSIS — J849 Interstitial pulmonary disease, unspecified: Secondary | ICD-10-CM | POA: Diagnosis not present

## 2023-04-08 DIAGNOSIS — R197 Diarrhea, unspecified: Secondary | ICD-10-CM | POA: Diagnosis not present

## 2023-04-08 DIAGNOSIS — E878 Other disorders of electrolyte and fluid balance, not elsewhere classified: Secondary | ICD-10-CM | POA: Diagnosis not present

## 2023-04-08 DIAGNOSIS — G620 Drug-induced polyneuropathy: Secondary | ICD-10-CM | POA: Diagnosis not present

## 2023-04-08 DIAGNOSIS — M109 Gout, unspecified: Secondary | ICD-10-CM | POA: Diagnosis not present

## 2023-04-08 DIAGNOSIS — E8809 Other disorders of plasma-protein metabolism, not elsewhere classified: Secondary | ICD-10-CM | POA: Diagnosis not present

## 2023-04-08 DIAGNOSIS — Z9981 Dependence on supplemental oxygen: Secondary | ICD-10-CM | POA: Diagnosis not present

## 2023-04-09 DIAGNOSIS — C159 Malignant neoplasm of esophagus, unspecified: Secondary | ICD-10-CM | POA: Diagnosis not present

## 2023-04-09 DIAGNOSIS — E8809 Other disorders of plasma-protein metabolism, not elsewhere classified: Secondary | ICD-10-CM | POA: Diagnosis not present

## 2023-04-09 DIAGNOSIS — I959 Hypotension, unspecified: Secondary | ICD-10-CM | POA: Diagnosis not present

## 2023-04-09 DIAGNOSIS — E878 Other disorders of electrolyte and fluid balance, not elsewhere classified: Secondary | ICD-10-CM | POA: Diagnosis not present

## 2023-04-09 DIAGNOSIS — J849 Interstitial pulmonary disease, unspecified: Secondary | ICD-10-CM | POA: Diagnosis not present

## 2023-04-09 DIAGNOSIS — G620 Drug-induced polyneuropathy: Secondary | ICD-10-CM | POA: Diagnosis not present

## 2023-04-09 DIAGNOSIS — E876 Hypokalemia: Secondary | ICD-10-CM | POA: Diagnosis not present

## 2023-04-09 DIAGNOSIS — Z7901 Long term (current) use of anticoagulants: Secondary | ICD-10-CM | POA: Diagnosis not present

## 2023-04-18 DIAGNOSIS — C155 Malignant neoplasm of lower third of esophagus: Secondary | ICD-10-CM | POA: Diagnosis not present

## 2023-04-18 DIAGNOSIS — Z923 Personal history of irradiation: Secondary | ICD-10-CM | POA: Diagnosis not present

## 2023-04-18 DIAGNOSIS — D701 Agranulocytosis secondary to cancer chemotherapy: Secondary | ICD-10-CM | POA: Diagnosis not present

## 2023-04-18 DIAGNOSIS — C799 Secondary malignant neoplasm of unspecified site: Secondary | ICD-10-CM | POA: Diagnosis not present

## 2023-04-18 DIAGNOSIS — I951 Orthostatic hypotension: Secondary | ICD-10-CM | POA: Diagnosis not present

## 2023-04-18 DIAGNOSIS — Z79899 Other long term (current) drug therapy: Secondary | ICD-10-CM | POA: Diagnosis not present

## 2023-04-18 DIAGNOSIS — E860 Dehydration: Secondary | ICD-10-CM | POA: Diagnosis not present

## 2023-04-18 DIAGNOSIS — D649 Anemia, unspecified: Secondary | ICD-10-CM | POA: Diagnosis not present

## 2023-04-18 DIAGNOSIS — T451X5A Adverse effect of antineoplastic and immunosuppressive drugs, initial encounter: Secondary | ICD-10-CM | POA: Diagnosis not present

## 2023-04-19 DIAGNOSIS — C155 Malignant neoplasm of lower third of esophagus: Secondary | ICD-10-CM | POA: Diagnosis not present

## 2023-04-19 DIAGNOSIS — Z5111 Encounter for antineoplastic chemotherapy: Secondary | ICD-10-CM | POA: Diagnosis not present

## 2023-04-25 DIAGNOSIS — M4802 Spinal stenosis, cervical region: Secondary | ICD-10-CM | POA: Diagnosis not present

## 2023-05-07 DIAGNOSIS — R0600 Dyspnea, unspecified: Secondary | ICD-10-CM | POA: Diagnosis not present

## 2023-05-07 DIAGNOSIS — J9601 Acute respiratory failure with hypoxia: Secondary | ICD-10-CM | POA: Diagnosis not present

## 2023-05-09 DIAGNOSIS — G629 Polyneuropathy, unspecified: Secondary | ICD-10-CM | POA: Diagnosis not present

## 2023-05-09 DIAGNOSIS — K219 Gastro-esophageal reflux disease without esophagitis: Secondary | ICD-10-CM | POA: Diagnosis not present

## 2023-05-09 DIAGNOSIS — M5023 Other cervical disc displacement, cervicothoracic region: Secondary | ICD-10-CM | POA: Diagnosis not present

## 2023-05-09 DIAGNOSIS — C159 Malignant neoplasm of esophagus, unspecified: Secondary | ICD-10-CM | POA: Diagnosis not present

## 2023-05-09 DIAGNOSIS — C155 Malignant neoplasm of lower third of esophagus: Secondary | ICD-10-CM | POA: Diagnosis not present

## 2023-05-09 DIAGNOSIS — M47811 Spondylosis without myelopathy or radiculopathy, occipito-atlanto-axial region: Secondary | ICD-10-CM | POA: Diagnosis not present

## 2023-05-09 DIAGNOSIS — I959 Hypotension, unspecified: Secondary | ICD-10-CM | POA: Diagnosis not present

## 2023-05-09 DIAGNOSIS — G9589 Other specified diseases of spinal cord: Secondary | ICD-10-CM | POA: Diagnosis not present

## 2023-05-09 DIAGNOSIS — M5033 Other cervical disc degeneration, cervicothoracic region: Secondary | ICD-10-CM | POA: Diagnosis not present

## 2023-05-09 DIAGNOSIS — M47812 Spondylosis without myelopathy or radiculopathy, cervical region: Secondary | ICD-10-CM | POA: Diagnosis not present

## 2023-05-09 DIAGNOSIS — M4802 Spinal stenosis, cervical region: Secondary | ICD-10-CM | POA: Diagnosis not present

## 2023-05-09 DIAGNOSIS — I2584 Coronary atherosclerosis due to calcified coronary lesion: Secondary | ICD-10-CM | POA: Diagnosis not present

## 2023-05-09 DIAGNOSIS — J849 Interstitial pulmonary disease, unspecified: Secondary | ICD-10-CM | POA: Diagnosis not present

## 2023-05-16 DIAGNOSIS — C155 Malignant neoplasm of lower third of esophagus: Secondary | ICD-10-CM | POA: Diagnosis not present

## 2023-05-23 DIAGNOSIS — Z9049 Acquired absence of other specified parts of digestive tract: Secondary | ICD-10-CM | POA: Diagnosis not present

## 2023-05-23 DIAGNOSIS — K2289 Other specified disease of esophagus: Secondary | ICD-10-CM | POA: Diagnosis not present

## 2023-05-23 DIAGNOSIS — R937 Abnormal findings on diagnostic imaging of other parts of musculoskeletal system: Secondary | ICD-10-CM | POA: Diagnosis not present

## 2023-05-23 DIAGNOSIS — C155 Malignant neoplasm of lower third of esophagus: Secondary | ICD-10-CM | POA: Diagnosis not present

## 2023-05-25 DIAGNOSIS — Z801 Family history of malignant neoplasm of trachea, bronchus and lung: Secondary | ICD-10-CM | POA: Diagnosis not present

## 2023-05-25 DIAGNOSIS — Z923 Personal history of irradiation: Secondary | ICD-10-CM | POA: Diagnosis not present

## 2023-05-25 DIAGNOSIS — K219 Gastro-esophageal reflux disease without esophagitis: Secondary | ICD-10-CM | POA: Diagnosis not present

## 2023-05-25 DIAGNOSIS — J984 Other disorders of lung: Secondary | ICD-10-CM | POA: Diagnosis not present

## 2023-05-25 DIAGNOSIS — J9611 Chronic respiratory failure with hypoxia: Secondary | ICD-10-CM | POA: Diagnosis not present

## 2023-05-25 DIAGNOSIS — T50905A Adverse effect of unspecified drugs, medicaments and biological substances, initial encounter: Secondary | ICD-10-CM | POA: Diagnosis not present

## 2023-05-25 DIAGNOSIS — J701 Chronic and other pulmonary manifestations due to radiation: Secondary | ICD-10-CM | POA: Diagnosis not present

## 2023-05-25 DIAGNOSIS — C155 Malignant neoplasm of lower third of esophagus: Secondary | ICD-10-CM | POA: Diagnosis not present

## 2023-05-25 DIAGNOSIS — Z85028 Personal history of other malignant neoplasm of stomach: Secondary | ICD-10-CM | POA: Diagnosis not present

## 2023-05-25 DIAGNOSIS — T50905D Adverse effect of unspecified drugs, medicaments and biological substances, subsequent encounter: Secondary | ICD-10-CM | POA: Diagnosis not present

## 2023-05-25 DIAGNOSIS — Z20822 Contact with and (suspected) exposure to covid-19: Secondary | ICD-10-CM | POA: Diagnosis not present

## 2023-05-25 DIAGNOSIS — G4733 Obstructive sleep apnea (adult) (pediatric): Secondary | ICD-10-CM | POA: Diagnosis not present

## 2023-05-25 DIAGNOSIS — Z8501 Personal history of malignant neoplasm of esophagus: Secondary | ICD-10-CM | POA: Diagnosis not present

## 2023-05-25 DIAGNOSIS — J704 Drug-induced interstitial lung disorders, unspecified: Secondary | ICD-10-CM | POA: Diagnosis not present

## 2023-05-30 DIAGNOSIS — M4802 Spinal stenosis, cervical region: Secondary | ICD-10-CM | POA: Diagnosis not present

## 2023-05-31 DIAGNOSIS — J704 Drug-induced interstitial lung disorders, unspecified: Secondary | ICD-10-CM | POA: Diagnosis not present

## 2023-06-07 DIAGNOSIS — I959 Hypotension, unspecified: Secondary | ICD-10-CM | POA: Diagnosis not present

## 2023-06-07 DIAGNOSIS — R0600 Dyspnea, unspecified: Secondary | ICD-10-CM | POA: Diagnosis not present

## 2023-06-07 DIAGNOSIS — J9601 Acute respiratory failure with hypoxia: Secondary | ICD-10-CM | POA: Diagnosis not present

## 2023-06-07 DIAGNOSIS — G629 Polyneuropathy, unspecified: Secondary | ICD-10-CM | POA: Diagnosis not present

## 2023-06-13 DIAGNOSIS — C155 Malignant neoplasm of lower third of esophagus: Secondary | ICD-10-CM | POA: Diagnosis not present

## 2023-06-13 DIAGNOSIS — Z1159 Encounter for screening for other viral diseases: Secondary | ICD-10-CM | POA: Diagnosis not present

## 2023-06-16 DIAGNOSIS — J984 Other disorders of lung: Secondary | ICD-10-CM | POA: Diagnosis not present

## 2023-06-16 DIAGNOSIS — G620 Drug-induced polyneuropathy: Secondary | ICD-10-CM | POA: Diagnosis not present

## 2023-06-16 DIAGNOSIS — C155 Malignant neoplasm of lower third of esophagus: Secondary | ICD-10-CM | POA: Diagnosis not present

## 2023-06-16 DIAGNOSIS — T451X5A Adverse effect of antineoplastic and immunosuppressive drugs, initial encounter: Secondary | ICD-10-CM | POA: Diagnosis not present

## 2023-06-16 DIAGNOSIS — T50905A Adverse effect of unspecified drugs, medicaments and biological substances, initial encounter: Secondary | ICD-10-CM | POA: Diagnosis not present

## 2023-06-20 ENCOUNTER — Telehealth: Payer: Self-pay | Admitting: Cardiology

## 2023-06-20 DIAGNOSIS — C155 Malignant neoplasm of lower third of esophagus: Secondary | ICD-10-CM | POA: Diagnosis not present

## 2023-06-20 NOTE — Telephone Encounter (Signed)
 Ram/tox is the new chemo combination he will be taking.  Patient states they started fludrocortisone  (FLORINEF ) 0.1 MG tablet  to bring his BP up over the last 2 months while not being on Chemo. And while he is taking chemo this always brings his BP up he says. Patient states Oncologist is concerned this medication and new chemo with raise BP too high his current BP is 147/70 this is current before starting chemo in 2 weeks  Patient states all Oncologist notes should be in [facility] everywhere

## 2023-06-20 NOTE — Telephone Encounter (Signed)
 Pt c/o medication issue:  1. Name of Medication: fludrocortisone  (FLORINEF ) 0.1 MG tablet   2. How are you currently taking this medication (dosage and times per day)? Take 2 tablets (0.2 mg total) by mouth daily. Dose increase   3. Are you having a reaction (difficulty breathing--STAT)? No  4. What is your medication issue? Patient is calling because he will start a new ChemoTherapy in two weeks. Patient stated he might need to stop this medication before starting his therapy. Please advise.

## 2023-06-21 DIAGNOSIS — J189 Pneumonia, unspecified organism: Secondary | ICD-10-CM | POA: Diagnosis not present

## 2023-06-21 DIAGNOSIS — J8489 Other specified interstitial pulmonary diseases: Secondary | ICD-10-CM | POA: Diagnosis not present

## 2023-06-21 DIAGNOSIS — J479 Bronchiectasis, uncomplicated: Secondary | ICD-10-CM | POA: Diagnosis not present

## 2023-06-21 DIAGNOSIS — J704 Drug-induced interstitial lung disorders, unspecified: Secondary | ICD-10-CM | POA: Diagnosis not present

## 2023-06-21 DIAGNOSIS — R918 Other nonspecific abnormal finding of lung field: Secondary | ICD-10-CM | POA: Diagnosis not present

## 2023-06-22 DIAGNOSIS — Z1159 Encounter for screening for other viral diseases: Secondary | ICD-10-CM | POA: Diagnosis not present

## 2023-06-22 DIAGNOSIS — C155 Malignant neoplasm of lower third of esophagus: Secondary | ICD-10-CM | POA: Diagnosis not present

## 2023-06-22 NOTE — Telephone Encounter (Signed)
 Brandy with [facility] calling on pt's behalf (pt in office). Requesting update. Pt waiting on call

## 2023-06-23 NOTE — Telephone Encounter (Signed)
Patient is calling back for update. Please advise  

## 2023-06-23 NOTE — Telephone Encounter (Signed)
Can stop florinef, he is on another mediciation that also keeps his bp up which is the midodrine. We may need to wean that one over time as well. Can he contact us when he started the new treatment to arrange a bp check with [place] to see.   Dominga Ferry MD

## 2023-06-23 NOTE — Telephone Encounter (Signed)
Will forward message to provider for review of [facility] notes.

## 2023-06-23 NOTE — Telephone Encounter (Signed)
Patient informed and verbalized understanding of plan. 

## 2023-06-29 DIAGNOSIS — Z66 Do not resuscitate: Secondary | ICD-10-CM | POA: Diagnosis not present

## 2023-06-29 DIAGNOSIS — Z5111 Encounter for antineoplastic chemotherapy: Secondary | ICD-10-CM | POA: Diagnosis not present

## 2023-06-29 DIAGNOSIS — Z1159 Encounter for screening for other viral diseases: Secondary | ICD-10-CM | POA: Diagnosis not present

## 2023-06-29 DIAGNOSIS — Z5112 Encounter for antineoplastic immunotherapy: Secondary | ICD-10-CM | POA: Diagnosis not present

## 2023-06-29 DIAGNOSIS — C155 Malignant neoplasm of lower third of esophagus: Secondary | ICD-10-CM | POA: Diagnosis not present

## 2023-06-30 DIAGNOSIS — R059 Cough, unspecified: Secondary | ICD-10-CM | POA: Diagnosis not present

## 2023-06-30 DIAGNOSIS — J189 Pneumonia, unspecified organism: Secondary | ICD-10-CM | POA: Diagnosis not present

## 2023-06-30 DIAGNOSIS — C159 Malignant neoplasm of esophagus, unspecified: Secondary | ICD-10-CM | POA: Diagnosis not present

## 2023-06-30 DIAGNOSIS — Z6827 Body mass index (BMI) 27.0-27.9, adult: Secondary | ICD-10-CM | POA: Diagnosis not present

## 2023-06-30 DIAGNOSIS — J849 Interstitial pulmonary disease, unspecified: Secondary | ICD-10-CM | POA: Diagnosis not present

## 2023-07-05 DIAGNOSIS — C155 Malignant neoplasm of lower third of esophagus: Secondary | ICD-10-CM | POA: Diagnosis not present

## 2023-07-05 DIAGNOSIS — Z1159 Encounter for screening for other viral diseases: Secondary | ICD-10-CM | POA: Diagnosis not present

## 2023-07-06 DIAGNOSIS — C155 Malignant neoplasm of lower third of esophagus: Secondary | ICD-10-CM | POA: Diagnosis not present

## 2023-07-06 DIAGNOSIS — Z5111 Encounter for antineoplastic chemotherapy: Secondary | ICD-10-CM | POA: Diagnosis not present

## 2023-07-06 DIAGNOSIS — Z1159 Encounter for screening for other viral diseases: Secondary | ICD-10-CM | POA: Diagnosis not present

## 2023-07-08 DIAGNOSIS — R0600 Dyspnea, unspecified: Secondary | ICD-10-CM | POA: Diagnosis not present

## 2023-07-08 DIAGNOSIS — J9601 Acute respiratory failure with hypoxia: Secondary | ICD-10-CM | POA: Diagnosis not present

## 2023-07-11 DIAGNOSIS — C155 Malignant neoplasm of lower third of esophagus: Secondary | ICD-10-CM | POA: Diagnosis not present

## 2023-07-12 DIAGNOSIS — C155 Malignant neoplasm of lower third of esophagus: Secondary | ICD-10-CM | POA: Diagnosis not present

## 2023-07-12 DIAGNOSIS — Z7901 Long term (current) use of anticoagulants: Secondary | ICD-10-CM | POA: Diagnosis not present

## 2023-07-12 DIAGNOSIS — J704 Drug-induced interstitial lung disorders, unspecified: Secondary | ICD-10-CM | POA: Diagnosis not present

## 2023-07-12 DIAGNOSIS — J159 Unspecified bacterial pneumonia: Secondary | ICD-10-CM | POA: Diagnosis not present

## 2023-07-12 DIAGNOSIS — K219 Gastro-esophageal reflux disease without esophagitis: Secondary | ICD-10-CM | POA: Diagnosis not present

## 2023-07-12 DIAGNOSIS — Z20822 Contact with and (suspected) exposure to covid-19: Secondary | ICD-10-CM | POA: Diagnosis not present

## 2023-07-12 DIAGNOSIS — C160 Malignant neoplasm of cardia: Secondary | ICD-10-CM | POA: Diagnosis not present

## 2023-07-12 DIAGNOSIS — Z9981 Dependence on supplemental oxygen: Secondary | ICD-10-CM | POA: Diagnosis not present

## 2023-07-12 DIAGNOSIS — J9621 Acute and chronic respiratory failure with hypoxia: Secondary | ICD-10-CM | POA: Diagnosis not present

## 2023-07-12 DIAGNOSIS — Z8719 Personal history of other diseases of the digestive system: Secondary | ICD-10-CM | POA: Diagnosis not present

## 2023-07-12 DIAGNOSIS — Z79899 Other long term (current) drug therapy: Secondary | ICD-10-CM | POA: Diagnosis not present

## 2023-07-12 DIAGNOSIS — N400 Enlarged prostate without lower urinary tract symptoms: Secondary | ICD-10-CM | POA: Diagnosis not present

## 2023-07-12 DIAGNOSIS — D849 Immunodeficiency, unspecified: Secondary | ICD-10-CM | POA: Diagnosis not present

## 2023-07-12 DIAGNOSIS — J984 Other disorders of lung: Secondary | ICD-10-CM | POA: Diagnosis not present

## 2023-07-12 DIAGNOSIS — C799 Secondary malignant neoplasm of unspecified site: Secondary | ICD-10-CM | POA: Diagnosis not present

## 2023-07-12 DIAGNOSIS — R918 Other nonspecific abnormal finding of lung field: Secondary | ICD-10-CM | POA: Diagnosis not present

## 2023-07-12 DIAGNOSIS — J9611 Chronic respiratory failure with hypoxia: Secondary | ICD-10-CM | POA: Diagnosis not present

## 2023-07-12 DIAGNOSIS — G629 Polyneuropathy, unspecified: Secondary | ICD-10-CM | POA: Diagnosis not present

## 2023-07-12 DIAGNOSIS — J849 Interstitial pulmonary disease, unspecified: Secondary | ICD-10-CM | POA: Diagnosis not present

## 2023-07-12 DIAGNOSIS — E785 Hyperlipidemia, unspecified: Secondary | ICD-10-CM | POA: Diagnosis not present

## 2023-07-12 DIAGNOSIS — J188 Other pneumonia, unspecified organism: Secondary | ICD-10-CM | POA: Diagnosis not present

## 2023-07-12 DIAGNOSIS — E7800 Pure hypercholesterolemia, unspecified: Secondary | ICD-10-CM | POA: Diagnosis not present

## 2023-07-12 DIAGNOSIS — R0602 Shortness of breath: Secondary | ICD-10-CM | POA: Diagnosis not present

## 2023-07-12 DIAGNOSIS — J168 Pneumonia due to other specified infectious organisms: Secondary | ICD-10-CM | POA: Diagnosis not present

## 2023-07-12 DIAGNOSIS — G4733 Obstructive sleep apnea (adult) (pediatric): Secondary | ICD-10-CM | POA: Diagnosis not present

## 2023-07-12 DIAGNOSIS — Z1159 Encounter for screening for other viral diseases: Secondary | ICD-10-CM | POA: Diagnosis not present

## 2023-07-12 DIAGNOSIS — Z9889 Other specified postprocedural states: Secondary | ICD-10-CM | POA: Diagnosis not present

## 2023-07-22 DIAGNOSIS — C155 Malignant neoplasm of lower third of esophagus: Secondary | ICD-10-CM | POA: Diagnosis not present

## 2023-07-22 DIAGNOSIS — C160 Malignant neoplasm of cardia: Secondary | ICD-10-CM | POA: Diagnosis not present

## 2023-07-22 DIAGNOSIS — Z95828 Presence of other vascular implants and grafts: Secondary | ICD-10-CM | POA: Diagnosis not present

## 2023-07-26 DIAGNOSIS — Z95828 Presence of other vascular implants and grafts: Secondary | ICD-10-CM | POA: Diagnosis not present

## 2023-07-26 DIAGNOSIS — C155 Malignant neoplasm of lower third of esophagus: Secondary | ICD-10-CM | POA: Diagnosis not present

## 2023-07-28 DIAGNOSIS — J189 Pneumonia, unspecified organism: Secondary | ICD-10-CM | POA: Diagnosis not present

## 2023-07-28 DIAGNOSIS — Z6825 Body mass index (BMI) 25.0-25.9, adult: Secondary | ICD-10-CM | POA: Diagnosis not present

## 2023-07-29 DIAGNOSIS — Z95828 Presence of other vascular implants and grafts: Secondary | ICD-10-CM | POA: Diagnosis not present

## 2023-07-29 DIAGNOSIS — C155 Malignant neoplasm of lower third of esophagus: Secondary | ICD-10-CM | POA: Diagnosis not present

## 2023-08-01 DIAGNOSIS — T451X5A Adverse effect of antineoplastic and immunosuppressive drugs, initial encounter: Secondary | ICD-10-CM | POA: Diagnosis not present

## 2023-08-01 DIAGNOSIS — G620 Drug-induced polyneuropathy: Secondary | ICD-10-CM | POA: Diagnosis not present

## 2023-08-01 DIAGNOSIS — D701 Agranulocytosis secondary to cancer chemotherapy: Secondary | ICD-10-CM | POA: Diagnosis not present

## 2023-08-01 DIAGNOSIS — C799 Secondary malignant neoplasm of unspecified site: Secondary | ICD-10-CM | POA: Diagnosis not present

## 2023-08-01 DIAGNOSIS — C155 Malignant neoplasm of lower third of esophagus: Secondary | ICD-10-CM | POA: Diagnosis not present

## 2023-08-01 DIAGNOSIS — Z1159 Encounter for screening for other viral diseases: Secondary | ICD-10-CM | POA: Diagnosis not present

## 2023-08-01 DIAGNOSIS — D801 Nonfamilial hypogammaglobulinemia: Secondary | ICD-10-CM | POA: Diagnosis not present

## 2023-08-01 DIAGNOSIS — Z8619 Personal history of other infectious and parasitic diseases: Secondary | ICD-10-CM | POA: Diagnosis not present

## 2023-08-01 DIAGNOSIS — J704 Drug-induced interstitial lung disorders, unspecified: Secondary | ICD-10-CM | POA: Diagnosis not present

## 2023-08-02 DIAGNOSIS — Z1159 Encounter for screening for other viral diseases: Secondary | ICD-10-CM | POA: Diagnosis not present

## 2023-08-02 DIAGNOSIS — Z5111 Encounter for antineoplastic chemotherapy: Secondary | ICD-10-CM | POA: Diagnosis not present

## 2023-08-02 DIAGNOSIS — C155 Malignant neoplasm of lower third of esophagus: Secondary | ICD-10-CM | POA: Diagnosis not present

## 2023-08-05 DIAGNOSIS — J9601 Acute respiratory failure with hypoxia: Secondary | ICD-10-CM | POA: Diagnosis not present

## 2023-08-05 DIAGNOSIS — R0600 Dyspnea, unspecified: Secondary | ICD-10-CM | POA: Diagnosis not present

## 2023-08-05 DIAGNOSIS — Z95828 Presence of other vascular implants and grafts: Secondary | ICD-10-CM | POA: Diagnosis not present

## 2023-08-05 DIAGNOSIS — C155 Malignant neoplasm of lower third of esophagus: Secondary | ICD-10-CM | POA: Diagnosis not present

## 2023-08-08 DIAGNOSIS — T451X5D Adverse effect of antineoplastic and immunosuppressive drugs, subsequent encounter: Secondary | ICD-10-CM | POA: Diagnosis not present

## 2023-08-08 DIAGNOSIS — G620 Drug-induced polyneuropathy: Secondary | ICD-10-CM | POA: Diagnosis not present

## 2023-08-08 DIAGNOSIS — Z95828 Presence of other vascular implants and grafts: Secondary | ICD-10-CM | POA: Diagnosis not present

## 2023-08-08 DIAGNOSIS — Z1159 Encounter for screening for other viral diseases: Secondary | ICD-10-CM | POA: Diagnosis not present

## 2023-08-08 DIAGNOSIS — C155 Malignant neoplasm of lower third of esophagus: Secondary | ICD-10-CM | POA: Diagnosis not present

## 2023-08-08 DIAGNOSIS — C3491 Malignant neoplasm of unspecified part of right bronchus or lung: Secondary | ICD-10-CM | POA: Diagnosis not present

## 2023-08-08 DIAGNOSIS — C160 Malignant neoplasm of cardia: Secondary | ICD-10-CM | POA: Diagnosis not present

## 2023-08-09 DIAGNOSIS — D801 Nonfamilial hypogammaglobulinemia: Secondary | ICD-10-CM | POA: Diagnosis not present

## 2023-08-09 DIAGNOSIS — Z8619 Personal history of other infectious and parasitic diseases: Secondary | ICD-10-CM | POA: Diagnosis not present

## 2023-08-10 DIAGNOSIS — C155 Malignant neoplasm of lower third of esophagus: Secondary | ICD-10-CM | POA: Diagnosis not present

## 2023-08-10 DIAGNOSIS — Z1159 Encounter for screening for other viral diseases: Secondary | ICD-10-CM | POA: Diagnosis not present

## 2023-08-10 DIAGNOSIS — Z5111 Encounter for antineoplastic chemotherapy: Secondary | ICD-10-CM | POA: Diagnosis not present

## 2023-08-11 DIAGNOSIS — C155 Malignant neoplasm of lower third of esophagus: Secondary | ICD-10-CM | POA: Diagnosis not present

## 2023-08-15 DIAGNOSIS — Z1159 Encounter for screening for other viral diseases: Secondary | ICD-10-CM | POA: Diagnosis not present

## 2023-08-15 DIAGNOSIS — R233 Spontaneous ecchymoses: Secondary | ICD-10-CM | POA: Diagnosis not present

## 2023-08-15 DIAGNOSIS — Z923 Personal history of irradiation: Secondary | ICD-10-CM | POA: Diagnosis not present

## 2023-08-15 DIAGNOSIS — C155 Malignant neoplasm of lower third of esophagus: Secondary | ICD-10-CM | POA: Diagnosis not present

## 2023-08-15 DIAGNOSIS — R319 Hematuria, unspecified: Secondary | ICD-10-CM | POA: Diagnosis not present

## 2023-08-15 DIAGNOSIS — G620 Drug-induced polyneuropathy: Secondary | ICD-10-CM | POA: Diagnosis not present

## 2023-08-15 DIAGNOSIS — Z9221 Personal history of antineoplastic chemotherapy: Secondary | ICD-10-CM | POA: Diagnosis not present

## 2023-08-15 DIAGNOSIS — T451X5D Adverse effect of antineoplastic and immunosuppressive drugs, subsequent encounter: Secondary | ICD-10-CM | POA: Diagnosis not present

## 2023-08-19 DIAGNOSIS — Z95828 Presence of other vascular implants and grafts: Secondary | ICD-10-CM | POA: Diagnosis not present

## 2023-08-19 DIAGNOSIS — C155 Malignant neoplasm of lower third of esophagus: Secondary | ICD-10-CM | POA: Diagnosis not present

## 2023-08-22 DIAGNOSIS — C155 Malignant neoplasm of lower third of esophagus: Secondary | ICD-10-CM | POA: Diagnosis not present

## 2023-08-22 DIAGNOSIS — Z1159 Encounter for screening for other viral diseases: Secondary | ICD-10-CM | POA: Diagnosis not present

## 2023-08-22 DIAGNOSIS — J40 Bronchitis, not specified as acute or chronic: Secondary | ICD-10-CM | POA: Diagnosis not present

## 2023-08-22 DIAGNOSIS — Z792 Long term (current) use of antibiotics: Secondary | ICD-10-CM | POA: Diagnosis not present

## 2023-08-22 DIAGNOSIS — Z79899 Other long term (current) drug therapy: Secondary | ICD-10-CM | POA: Diagnosis not present

## 2023-08-22 DIAGNOSIS — I2510 Atherosclerotic heart disease of native coronary artery without angina pectoris: Secondary | ICD-10-CM | POA: Diagnosis not present

## 2023-08-22 DIAGNOSIS — Z951 Presence of aortocoronary bypass graft: Secondary | ICD-10-CM | POA: Diagnosis not present

## 2023-08-22 DIAGNOSIS — Z981 Arthrodesis status: Secondary | ICD-10-CM | POA: Diagnosis not present

## 2023-08-22 DIAGNOSIS — G629 Polyneuropathy, unspecified: Secondary | ICD-10-CM | POA: Diagnosis not present

## 2023-08-22 DIAGNOSIS — R0602 Shortness of breath: Secondary | ICD-10-CM | POA: Diagnosis not present

## 2023-08-26 DIAGNOSIS — Z1159 Encounter for screening for other viral diseases: Secondary | ICD-10-CM | POA: Diagnosis not present

## 2023-08-26 DIAGNOSIS — C155 Malignant neoplasm of lower third of esophagus: Secondary | ICD-10-CM | POA: Diagnosis not present

## 2023-08-26 DIAGNOSIS — Z95828 Presence of other vascular implants and grafts: Secondary | ICD-10-CM | POA: Diagnosis not present

## 2023-08-29 DIAGNOSIS — C155 Malignant neoplasm of lower third of esophagus: Secondary | ICD-10-CM | POA: Diagnosis not present

## 2023-08-29 DIAGNOSIS — Z7952 Long term (current) use of systemic steroids: Secondary | ICD-10-CM | POA: Diagnosis not present

## 2023-08-29 DIAGNOSIS — Z1159 Encounter for screening for other viral diseases: Secondary | ICD-10-CM | POA: Diagnosis not present

## 2023-08-29 DIAGNOSIS — T451X5A Adverse effect of antineoplastic and immunosuppressive drugs, initial encounter: Secondary | ICD-10-CM | POA: Diagnosis not present

## 2023-08-29 DIAGNOSIS — Z7951 Long term (current) use of inhaled steroids: Secondary | ICD-10-CM | POA: Diagnosis not present

## 2023-08-29 DIAGNOSIS — G620 Drug-induced polyneuropathy: Secondary | ICD-10-CM | POA: Diagnosis not present

## 2023-08-29 DIAGNOSIS — Z9221 Personal history of antineoplastic chemotherapy: Secondary | ICD-10-CM | POA: Diagnosis not present

## 2023-08-29 DIAGNOSIS — Z7901 Long term (current) use of anticoagulants: Secondary | ICD-10-CM | POA: Diagnosis not present

## 2023-08-29 DIAGNOSIS — Z980 Intestinal bypass and anastomosis status: Secondary | ICD-10-CM | POA: Diagnosis not present

## 2023-08-31 DIAGNOSIS — J9611 Chronic respiratory failure with hypoxia: Secondary | ICD-10-CM | POA: Diagnosis not present

## 2023-08-31 DIAGNOSIS — J701 Chronic and other pulmonary manifestations due to radiation: Secondary | ICD-10-CM | POA: Diagnosis not present

## 2023-08-31 DIAGNOSIS — J704 Drug-induced interstitial lung disorders, unspecified: Secondary | ICD-10-CM | POA: Diagnosis not present

## 2023-08-31 DIAGNOSIS — T50905A Adverse effect of unspecified drugs, medicaments and biological substances, initial encounter: Secondary | ICD-10-CM | POA: Diagnosis not present

## 2023-08-31 DIAGNOSIS — Z2989 Encounter for other specified prophylactic measures: Secondary | ICD-10-CM | POA: Diagnosis not present

## 2023-08-31 DIAGNOSIS — D801 Nonfamilial hypogammaglobulinemia: Secondary | ICD-10-CM | POA: Diagnosis not present

## 2023-09-02 DIAGNOSIS — J702 Acute drug-induced interstitial lung disorders: Secondary | ICD-10-CM | POA: Diagnosis not present

## 2023-09-02 DIAGNOSIS — Z9981 Dependence on supplemental oxygen: Secondary | ICD-10-CM | POA: Diagnosis not present

## 2023-09-02 DIAGNOSIS — R059 Cough, unspecified: Secondary | ICD-10-CM | POA: Diagnosis not present

## 2023-09-02 DIAGNOSIS — R634 Abnormal weight loss: Secondary | ICD-10-CM | POA: Diagnosis not present

## 2023-09-02 DIAGNOSIS — F32A Depression, unspecified: Secondary | ICD-10-CM | POA: Diagnosis not present

## 2023-09-02 DIAGNOSIS — J9610 Chronic respiratory failure, unspecified whether with hypoxia or hypercapnia: Secondary | ICD-10-CM | POA: Diagnosis not present

## 2023-09-02 DIAGNOSIS — J703 Chronic drug-induced interstitial lung disorders: Secondary | ICD-10-CM | POA: Diagnosis not present

## 2023-09-02 DIAGNOSIS — R42 Dizziness and giddiness: Secondary | ICD-10-CM | POA: Diagnosis not present

## 2023-09-05 DIAGNOSIS — J9601 Acute respiratory failure with hypoxia: Secondary | ICD-10-CM | POA: Diagnosis not present

## 2023-09-05 DIAGNOSIS — R0600 Dyspnea, unspecified: Secondary | ICD-10-CM | POA: Diagnosis not present

## 2023-09-05 DIAGNOSIS — C155 Malignant neoplasm of lower third of esophagus: Secondary | ICD-10-CM | POA: Diagnosis not present

## 2023-09-05 DIAGNOSIS — Z1159 Encounter for screening for other viral diseases: Secondary | ICD-10-CM | POA: Diagnosis not present

## 2023-09-06 DIAGNOSIS — Z8619 Personal history of other infectious and parasitic diseases: Secondary | ICD-10-CM | POA: Diagnosis not present

## 2023-09-06 DIAGNOSIS — D801 Nonfamilial hypogammaglobulinemia: Secondary | ICD-10-CM | POA: Diagnosis not present

## 2023-09-08 DIAGNOSIS — R0600 Dyspnea, unspecified: Secondary | ICD-10-CM | POA: Diagnosis not present

## 2023-09-08 DIAGNOSIS — R42 Dizziness and giddiness: Secondary | ICD-10-CM | POA: Diagnosis not present

## 2023-09-08 DIAGNOSIS — F32A Depression, unspecified: Secondary | ICD-10-CM | POA: Diagnosis not present

## 2023-09-08 DIAGNOSIS — J703 Chronic drug-induced interstitial lung disorders: Secondary | ICD-10-CM | POA: Diagnosis not present

## 2023-09-08 DIAGNOSIS — Z9981 Dependence on supplemental oxygen: Secondary | ICD-10-CM | POA: Diagnosis not present

## 2023-09-08 DIAGNOSIS — R059 Cough, unspecified: Secondary | ICD-10-CM | POA: Diagnosis not present

## 2023-09-08 DIAGNOSIS — J702 Acute drug-induced interstitial lung disorders: Secondary | ICD-10-CM | POA: Diagnosis not present

## 2023-09-08 DIAGNOSIS — J9610 Chronic respiratory failure, unspecified whether with hypoxia or hypercapnia: Secondary | ICD-10-CM | POA: Diagnosis not present

## 2023-09-09 DIAGNOSIS — C155 Malignant neoplasm of lower third of esophagus: Secondary | ICD-10-CM | POA: Diagnosis not present

## 2023-09-09 DIAGNOSIS — Z95828 Presence of other vascular implants and grafts: Secondary | ICD-10-CM | POA: Diagnosis not present

## 2023-09-12 DIAGNOSIS — C155 Malignant neoplasm of lower third of esophagus: Secondary | ICD-10-CM | POA: Diagnosis not present

## 2023-09-12 DIAGNOSIS — Z1159 Encounter for screening for other viral diseases: Secondary | ICD-10-CM | POA: Diagnosis not present

## 2023-09-16 ENCOUNTER — Encounter: Payer: Self-pay | Admitting: Nurse Practitioner

## 2023-09-16 ENCOUNTER — Ambulatory Visit: Payer: Medicare PPO | Attending: Nurse Practitioner | Admitting: Nurse Practitioner

## 2023-09-16 VITALS — BP 128/72 | HR 70 | Ht 69.5 in | Wt 175.2 lb

## 2023-09-16 DIAGNOSIS — I9589 Other hypotension: Secondary | ICD-10-CM

## 2023-09-16 DIAGNOSIS — Z8501 Personal history of malignant neoplasm of esophagus: Secondary | ICD-10-CM | POA: Diagnosis not present

## 2023-09-16 DIAGNOSIS — I951 Orthostatic hypotension: Secondary | ICD-10-CM

## 2023-09-16 DIAGNOSIS — I959 Hypotension, unspecified: Secondary | ICD-10-CM

## 2023-09-16 DIAGNOSIS — E785 Hyperlipidemia, unspecified: Secondary | ICD-10-CM | POA: Diagnosis not present

## 2023-09-16 DIAGNOSIS — I2510 Atherosclerotic heart disease of native coronary artery without angina pectoris: Secondary | ICD-10-CM | POA: Diagnosis not present

## 2023-09-16 DIAGNOSIS — I714 Abdominal aortic aneurysm, without rupture, unspecified: Secondary | ICD-10-CM

## 2023-09-16 DIAGNOSIS — Z86718 Personal history of other venous thrombosis and embolism: Secondary | ICD-10-CM | POA: Diagnosis not present

## 2023-09-16 MED ORDER — MIDODRINE HCL 2.5 MG PO TABS: 2.5000 mg | ORAL_TABLET | Freq: Three times a day (TID) | ORAL | 4 refills | Status: DC

## 2023-09-16 NOTE — Progress Notes (Signed)
 Cardiology Office Note:    Date:  09/16/2023 ID:  Adrian Gibson, DOB 24-Jan-1951, MRN [id number] PCP:  Jolynn Needy, [facility] Providers Cardiologist:  Armida Lander, MD   Referring MD: Jolynn Needy, Adrian   CC: Here for 6 month follow-up  History of Present Illness:    Adrian Gibson is a very pleasant 73 y.o. male with a PMH of CAD, s/p CABG x 4 in 2011, hypertension, hyperlipidemia, dysphagia/esophageal cancer, history of DVT, orthostatic hypotension, AAA, who presents today for scheduled follow-up.  Last seen by Dr. Armida Lander on Oct 18, 2022.  Blood pressure in office that day was 80/45.  Was noted to have recurrent hypotension that only occurred when receiving chemo treatments.  Was instructed to start midodrine 2.5 mg 3 times daily, recommended to titrate as needed.  Hospital visit in September 2024 after syncopal episode at home, caused him to fall and hit his head.  This was surrounding his chemoinfusion, had been receiving IV fluids the day before.  At the time, was on midodrine 10 mg in the morning and lunch, 5 mg at nighttime.  Patient remained orthostatic and near syncopal in the hospital, blood pressure dropped to 67/48, 78/52.  CT of cervical spine was negative for acute fracture, CT of the head showed scalp hematoma with no skull fracture or SDH, lab work was unremarkable.  Was given IV hydration.  Midodrine dose was increased and started on Florinef.  Orthostatics much improved, patient was asymptomatic.  Echocardiogram revealed EF > 55%, mild aortic valve regurgitation, mild to moderately dilated right ventricular with normal systolic function, mildly dilated ascending aorta.  03/18/2023 - Today he presents for follow-up. He states he is doing well from a cardiac perspective. Does have to wear oxygen during exertion, wears between 2-3 liters via nasal cannula. Does continue to have drops in BP, wants to know if Florinef dose can be increased. Denies  any chest pain, worsening shortness of breath, palpitations, syncope, presyncope, dizziness, orthopnea, PND, swelling or significant weight changes, acute bleeding, or claudication.  09/16/2023 -presents today for follow-up with his wife.  Tells me he was hospitalized with pneumonia earlier this year at [facility]. I have reviewed the records. Hospitalized in February 2025 at [facility] for LLL CAP, received IV antibiotics and IV corticosteroids.  Discharged on oral antibiotics. Wife says there were some bleeding issues during this time, was taken off his chemo due to this.  Patient denies any bleeding issues currently.  He is currently off chemo and says he feels pretty good.  Wife confirms he still is having episodes of postprandial dizziness and symptoms related to hypotension. Continues to have drops in BP.  No longer taking midodrine or Florinef. Denies any chest pain, shortness of breath, palpitations, syncope, orthopnea, PND, swelling or significant weight changes, acute bleeding, or claudication.  SH: In his free time, he enjoys woodworking and performing yard work.   Please see the history of present illness.    All other systems reviewed and are negative.  EKGs/Labs/Other Studies Reviewed:    The following studies were reviewed today:   EKG:   EKG Interpretation Date/Time:  Friday September 16 2023 14:00:54 EDT Ventricular Rate:  68 PR Interval:  182 QRS Duration:  118 QT Interval:  402 QTC Calculation: 427 R Axis:   34  Text Interpretation: Normal sinus rhythm Incomplete right bundle branch block When compared with ECG of 15-Jan-2010 02:53, Incomplete right bundle branch block is now Present Confirmed by  Sharlene Dory [id number]) on 09/16/2023 2:22:35 PM    Myoview on 08/21/2021:   The study is normal. The study is low risk.   No ST deviation was noted.   Moderate size mild intensity inferior defect most intense in the resting images with normal wall motion consistent with diaphragmatic  attenuation   Left ventricular function is normal. Nuclear stress EF: 69 %. The left ventricular ejection fraction is hyperdynamic (>65%). End diastolic cavity size is normal.  Vascular US Aorta Medicare Screen on 03/17/2017: Final Interpretation:  Abdominal Aorta: No evidence of an abdominal aortic aneurysm was  visualized. The largest aortic measurement is 2.5 cm.  IVC is patent.  Physical Exam:    VS:  BP 128/72   Pulse 70   Ht 5' 9.5" (1.765 m)   Wt 175 lb 3.2 oz (79.5 kg)   SpO2 98%   BMI 25.50 kg/m     Wt Readings from Last 3 Encounters:  09/16/23 175 lb 3.2 oz (79.5 kg)  03/18/23 192 lb 6.4 oz (87.3 kg)  10/18/22 182 lb 3.2 oz (82.6 kg)     GEN: Thin, 73 y.o. male in no acute distress HEENT: Normal NECK: No JVD; No carotid bruits CARDIAC: S1/S2, RRR, no murmurs, rubs, gallops; 2+ pulses RESPIRATORY:  Clear and diminished without wheezing or rhonchi MUSCULOSKELETAL:  No edema; No deformity  SKIN: Warm and dry, pale overall NEUROLOGIC:  Alert and oriented x 3 PSYCHIATRIC:  Pleasant affect   ASSESSMENT & PLAN:     CAD, s/p CABG x 4 in 2011  NST 08/2021 was negative for ischemia. Stable with no anginal symptoms. No indication for ischemic evaluation. Continue rosuvastatin and NTG PRN.  Not on aspirin due to being on Eliquis.  Heart healthy diet and regular cardiovascular exercise encouraged.   2. Hypotension, orthostatic hypotension/ postprandial hypotension Denies any recent or recurrent syncopal episodes, does continue to have drops/low BP.  No longer on midodrine and florinef.  Will initiate midodrine 2.5 mg 3 times daily to help improve symptoms.  No other medication changes at this time. Encouraged patient to stay well-hydrated. Heart healthy diet and regular cardiovascular exercise encouraged.   3. HLD After chart review, no lipid panel on file since 2021. Being managed by PCP. Continue Crestor. Will request labs from PCP. Heart healthy diet and regular  cardiovascular exercise encouraged.   4. Esophageal cancer Continue to follow up with Oncology.   5. History of DVT Being managed by Heme/Onc. Continue Eliquis 5 mg BID, on appropriate dosage. Denies any bleeding issues. Continue to follow-up with Oncology.   6. AAA Infrarenal 3.2 cm AAA noted on CT of Chest, abdomen, and pelvis in 2022. Was recommend to repeat US every three years. Plan to update AAA [place] in 2025 - plan to address at next office visit. Heart healthy diet and regular cardiovascular exercise encouraged.   7. Disposition: Care and ED precautions discussed. Follow-up with Dr. Dina Rich or APP in 2-3 months or sooner if anything changes.    Medication Adjustments/Labs and Tests Ordered: Current medicines are reviewed at length with the patient today.  Concerns regarding medicines are outlined above.  Orders Placed This Encounter  Procedures   EKG 12-Lead   Meds ordered this encounter  Medications   midodrine (PROAMATINE) 2.5 MG tablet    Sig: Take 1 tablet (2.5 mg total) by mouth 3 (three) times daily with meals.    Dispense:  90 tablet    Refill:  4    New 09/16/23  Patient Instructions  Medication Instructions:  Your physician has recommended you make the following change in your medication:  Please start Midodrine 2.5 Mg three times a day   Labwork: None   Testing/Procedures: None   Follow-Up: Your physician recommends that you schedule a follow-up appointment in: 2-3 months   Any Other Special Instructions Will Be Listed Below (If Applicable).  If you need a refill on your cardiac medications before your next appointment, please call your pharmacy.   Signed, Lasalle Pointer, NP

## 2023-09-16 NOTE — Patient Instructions (Addendum)
 Medication Instructions:  Your physician has recommended you make the following change in your medication:  Please start Midodrine 2.5 Mg three times a day   Labwork: None   Testing/Procedures: None   Follow-Up: Your physician recommends that you schedule a follow-up appointment in: 2-3 months   Any Other Special Instructions Will Be Listed Below (If Applicable).  If you need a refill on your cardiac medications before your next appointment, please call your pharmacy.

## 2023-09-19 ENCOUNTER — Encounter: Payer: Self-pay | Admitting: Nurse Practitioner

## 2023-09-19 DIAGNOSIS — C155 Malignant neoplasm of lower third of esophagus: Secondary | ICD-10-CM | POA: Diagnosis not present

## 2023-09-19 DIAGNOSIS — Z95828 Presence of other vascular implants and grafts: Secondary | ICD-10-CM | POA: Diagnosis not present

## 2023-09-23 DIAGNOSIS — Z95828 Presence of other vascular implants and grafts: Secondary | ICD-10-CM | POA: Diagnosis not present

## 2023-09-23 DIAGNOSIS — C155 Malignant neoplasm of lower third of esophagus: Secondary | ICD-10-CM | POA: Diagnosis not present

## 2023-09-26 DIAGNOSIS — H35371 Puckering of macula, right eye: Secondary | ICD-10-CM | POA: Diagnosis not present

## 2023-09-26 DIAGNOSIS — H40013 Open angle with borderline findings, low risk, bilateral: Secondary | ICD-10-CM | POA: Diagnosis not present

## 2023-09-26 DIAGNOSIS — Z961 Presence of intraocular lens: Secondary | ICD-10-CM | POA: Diagnosis not present

## 2023-09-26 DIAGNOSIS — H43813 Vitreous degeneration, bilateral: Secondary | ICD-10-CM | POA: Diagnosis not present

## 2023-09-27 DIAGNOSIS — C155 Malignant neoplasm of lower third of esophagus: Secondary | ICD-10-CM | POA: Diagnosis not present

## 2023-09-27 DIAGNOSIS — Z95828 Presence of other vascular implants and grafts: Secondary | ICD-10-CM | POA: Diagnosis not present

## 2023-09-30 DIAGNOSIS — C155 Malignant neoplasm of lower third of esophagus: Secondary | ICD-10-CM | POA: Diagnosis not present

## 2023-09-30 DIAGNOSIS — Z95828 Presence of other vascular implants and grafts: Secondary | ICD-10-CM | POA: Diagnosis not present

## 2023-10-03 DIAGNOSIS — Z09 Encounter for follow-up examination after completed treatment for conditions other than malignant neoplasm: Secondary | ICD-10-CM | POA: Diagnosis not present

## 2023-10-03 DIAGNOSIS — C799 Secondary malignant neoplasm of unspecified site: Secondary | ICD-10-CM | POA: Diagnosis not present

## 2023-10-03 DIAGNOSIS — C155 Malignant neoplasm of lower third of esophagus: Secondary | ICD-10-CM | POA: Diagnosis not present

## 2023-10-03 DIAGNOSIS — D701 Agranulocytosis secondary to cancer chemotherapy: Secondary | ICD-10-CM | POA: Diagnosis not present

## 2023-10-03 DIAGNOSIS — T451X5A Adverse effect of antineoplastic and immunosuppressive drugs, initial encounter: Secondary | ICD-10-CM | POA: Diagnosis not present

## 2023-10-04 DIAGNOSIS — Z8619 Personal history of other infectious and parasitic diseases: Secondary | ICD-10-CM | POA: Diagnosis not present

## 2023-10-04 DIAGNOSIS — D801 Nonfamilial hypogammaglobulinemia: Secondary | ICD-10-CM | POA: Diagnosis not present

## 2023-10-05 DIAGNOSIS — R0600 Dyspnea, unspecified: Secondary | ICD-10-CM | POA: Diagnosis not present

## 2023-10-05 DIAGNOSIS — M549 Dorsalgia, unspecified: Secondary | ICD-10-CM | POA: Diagnosis not present

## 2023-10-05 DIAGNOSIS — R0781 Pleurodynia: Secondary | ICD-10-CM | POA: Diagnosis not present

## 2023-10-05 DIAGNOSIS — C159 Malignant neoplasm of esophagus, unspecified: Secondary | ICD-10-CM | POA: Diagnosis not present

## 2023-10-05 DIAGNOSIS — J9601 Acute respiratory failure with hypoxia: Secondary | ICD-10-CM | POA: Diagnosis not present

## 2023-10-05 DIAGNOSIS — Z6826 Body mass index (BMI) 26.0-26.9, adult: Secondary | ICD-10-CM | POA: Diagnosis not present

## 2023-10-07 DIAGNOSIS — R0600 Dyspnea, unspecified: Secondary | ICD-10-CM | POA: Diagnosis not present

## 2023-10-07 DIAGNOSIS — I700 Atherosclerosis of aorta: Secondary | ICD-10-CM | POA: Diagnosis not present

## 2023-10-07 DIAGNOSIS — Z79899 Other long term (current) drug therapy: Secondary | ICD-10-CM | POA: Diagnosis not present

## 2023-10-07 DIAGNOSIS — R42 Dizziness and giddiness: Secondary | ICD-10-CM | POA: Diagnosis not present

## 2023-10-07 DIAGNOSIS — I517 Cardiomegaly: Secondary | ICD-10-CM | POA: Diagnosis not present

## 2023-10-07 DIAGNOSIS — J702 Acute drug-induced interstitial lung disorders: Secondary | ICD-10-CM | POA: Diagnosis not present

## 2023-10-07 DIAGNOSIS — I2510 Atherosclerotic heart disease of native coronary artery without angina pectoris: Secondary | ICD-10-CM | POA: Diagnosis not present

## 2023-10-07 DIAGNOSIS — Z9981 Dependence on supplemental oxygen: Secondary | ICD-10-CM | POA: Diagnosis not present

## 2023-10-07 DIAGNOSIS — J703 Chronic drug-induced interstitial lung disorders: Secondary | ICD-10-CM | POA: Diagnosis not present

## 2023-10-07 DIAGNOSIS — Z7901 Long term (current) use of anticoagulants: Secondary | ICD-10-CM | POA: Diagnosis not present

## 2023-10-07 DIAGNOSIS — F32A Depression, unspecified: Secondary | ICD-10-CM | POA: Diagnosis not present

## 2023-10-07 DIAGNOSIS — C155 Malignant neoplasm of lower third of esophagus: Secondary | ICD-10-CM | POA: Diagnosis not present

## 2023-10-07 DIAGNOSIS — J9610 Chronic respiratory failure, unspecified whether with hypoxia or hypercapnia: Secondary | ICD-10-CM | POA: Diagnosis not present

## 2023-10-07 DIAGNOSIS — R059 Cough, unspecified: Secondary | ICD-10-CM | POA: Diagnosis not present

## 2023-10-10 DIAGNOSIS — Z95828 Presence of other vascular implants and grafts: Secondary | ICD-10-CM | POA: Diagnosis not present

## 2023-10-10 DIAGNOSIS — C155 Malignant neoplasm of lower third of esophagus: Secondary | ICD-10-CM | POA: Diagnosis not present

## 2023-10-14 DIAGNOSIS — I77810 Thoracic aortic ectasia: Secondary | ICD-10-CM | POA: Diagnosis not present

## 2023-10-14 DIAGNOSIS — C155 Malignant neoplasm of lower third of esophagus: Secondary | ICD-10-CM | POA: Diagnosis not present

## 2023-10-14 DIAGNOSIS — Z95828 Presence of other vascular implants and grafts: Secondary | ICD-10-CM | POA: Diagnosis not present

## 2023-10-14 DIAGNOSIS — C160 Malignant neoplasm of cardia: Secondary | ICD-10-CM | POA: Diagnosis not present

## 2023-10-18 DIAGNOSIS — R079 Chest pain, unspecified: Secondary | ICD-10-CM | POA: Diagnosis not present

## 2023-10-18 DIAGNOSIS — C155 Malignant neoplasm of lower third of esophagus: Secondary | ICD-10-CM | POA: Diagnosis not present

## 2023-10-18 DIAGNOSIS — R5383 Other fatigue: Secondary | ICD-10-CM | POA: Diagnosis not present

## 2023-10-18 DIAGNOSIS — R0602 Shortness of breath: Secondary | ICD-10-CM | POA: Diagnosis not present

## 2023-10-18 DIAGNOSIS — Z1159 Encounter for screening for other viral diseases: Secondary | ICD-10-CM | POA: Diagnosis not present

## 2023-10-21 DIAGNOSIS — C155 Malignant neoplasm of lower third of esophagus: Secondary | ICD-10-CM | POA: Diagnosis not present

## 2023-10-24 DIAGNOSIS — C155 Malignant neoplasm of lower third of esophagus: Secondary | ICD-10-CM | POA: Diagnosis not present

## 2023-10-24 DIAGNOSIS — Z95828 Presence of other vascular implants and grafts: Secondary | ICD-10-CM | POA: Diagnosis not present

## 2023-10-25 DIAGNOSIS — C155 Malignant neoplasm of lower third of esophagus: Secondary | ICD-10-CM | POA: Diagnosis not present

## 2023-10-27 ENCOUNTER — Ambulatory Visit: Admitting: Internal Medicine

## 2023-10-27 DIAGNOSIS — J702 Acute drug-induced interstitial lung disorders: Secondary | ICD-10-CM | POA: Diagnosis not present

## 2023-10-27 DIAGNOSIS — R059 Cough, unspecified: Secondary | ICD-10-CM | POA: Diagnosis not present

## 2023-10-27 DIAGNOSIS — J703 Chronic drug-induced interstitial lung disorders: Secondary | ICD-10-CM | POA: Diagnosis not present

## 2023-10-27 DIAGNOSIS — F32A Depression, unspecified: Secondary | ICD-10-CM | POA: Diagnosis not present

## 2023-10-27 DIAGNOSIS — J9610 Chronic respiratory failure, unspecified whether with hypoxia or hypercapnia: Secondary | ICD-10-CM | POA: Diagnosis not present

## 2023-10-27 DIAGNOSIS — C155 Malignant neoplasm of lower third of esophagus: Secondary | ICD-10-CM | POA: Diagnosis not present

## 2023-10-27 DIAGNOSIS — R0600 Dyspnea, unspecified: Secondary | ICD-10-CM | POA: Diagnosis not present

## 2023-10-27 DIAGNOSIS — R42 Dizziness and giddiness: Secondary | ICD-10-CM | POA: Diagnosis not present

## 2023-10-27 DIAGNOSIS — Z9981 Dependence on supplemental oxygen: Secondary | ICD-10-CM | POA: Diagnosis not present

## 2023-10-28 DIAGNOSIS — C159 Malignant neoplasm of esophagus, unspecified: Secondary | ICD-10-CM | POA: Diagnosis not present

## 2023-10-28 DIAGNOSIS — J9610 Chronic respiratory failure, unspecified whether with hypoxia or hypercapnia: Secondary | ICD-10-CM | POA: Diagnosis not present

## 2023-10-28 DIAGNOSIS — K219 Gastro-esophageal reflux disease without esophagitis: Secondary | ICD-10-CM | POA: Diagnosis not present

## 2023-10-28 DIAGNOSIS — Z9981 Dependence on supplemental oxygen: Secondary | ICD-10-CM | POA: Diagnosis not present

## 2023-10-28 DIAGNOSIS — J702 Acute drug-induced interstitial lung disorders: Secondary | ICD-10-CM | POA: Diagnosis not present

## 2023-10-28 DIAGNOSIS — C155 Malignant neoplasm of lower third of esophagus: Secondary | ICD-10-CM | POA: Diagnosis not present

## 2023-10-28 DIAGNOSIS — R0600 Dyspnea, unspecified: Secondary | ICD-10-CM | POA: Diagnosis not present

## 2023-10-28 DIAGNOSIS — Z515 Encounter for palliative care: Secondary | ICD-10-CM | POA: Diagnosis not present

## 2023-10-28 DIAGNOSIS — J703 Chronic drug-induced interstitial lung disorders: Secondary | ICD-10-CM | POA: Diagnosis not present

## 2023-10-29 DIAGNOSIS — J9601 Acute respiratory failure with hypoxia: Secondary | ICD-10-CM | POA: Diagnosis not present

## 2023-10-29 DIAGNOSIS — R0600 Dyspnea, unspecified: Secondary | ICD-10-CM | POA: Diagnosis not present

## 2023-11-01 DIAGNOSIS — Z95828 Presence of other vascular implants and grafts: Secondary | ICD-10-CM | POA: Diagnosis not present

## 2023-11-01 DIAGNOSIS — D801 Nonfamilial hypogammaglobulinemia: Secondary | ICD-10-CM | POA: Diagnosis not present

## 2023-11-01 DIAGNOSIS — Z8619 Personal history of other infectious and parasitic diseases: Secondary | ICD-10-CM | POA: Diagnosis not present

## 2023-11-03 DIAGNOSIS — K5900 Constipation, unspecified: Secondary | ICD-10-CM | POA: Diagnosis not present

## 2023-11-03 DIAGNOSIS — Z6825 Body mass index (BMI) 25.0-25.9, adult: Secondary | ICD-10-CM | POA: Diagnosis not present

## 2023-11-03 DIAGNOSIS — J320 Chronic maxillary sinusitis: Secondary | ICD-10-CM | POA: Diagnosis not present

## 2023-11-03 DIAGNOSIS — J849 Interstitial pulmonary disease, unspecified: Secondary | ICD-10-CM | POA: Diagnosis not present

## 2023-11-03 DIAGNOSIS — J019 Acute sinusitis, unspecified: Secondary | ICD-10-CM | POA: Diagnosis not present

## 2023-11-04 DIAGNOSIS — Z95828 Presence of other vascular implants and grafts: Secondary | ICD-10-CM | POA: Diagnosis not present

## 2023-11-04 DIAGNOSIS — K219 Gastro-esophageal reflux disease without esophagitis: Secondary | ICD-10-CM | POA: Diagnosis not present

## 2023-11-04 DIAGNOSIS — J9610 Chronic respiratory failure, unspecified whether with hypoxia or hypercapnia: Secondary | ICD-10-CM | POA: Diagnosis not present

## 2023-11-04 DIAGNOSIS — Z9981 Dependence on supplemental oxygen: Secondary | ICD-10-CM | POA: Diagnosis not present

## 2023-11-04 DIAGNOSIS — R0600 Dyspnea, unspecified: Secondary | ICD-10-CM | POA: Diagnosis not present

## 2023-11-04 DIAGNOSIS — Z515 Encounter for palliative care: Secondary | ICD-10-CM | POA: Diagnosis not present

## 2023-11-04 DIAGNOSIS — C155 Malignant neoplasm of lower third of esophagus: Secondary | ICD-10-CM | POA: Diagnosis not present

## 2023-11-04 DIAGNOSIS — C159 Malignant neoplasm of esophagus, unspecified: Secondary | ICD-10-CM | POA: Diagnosis not present

## 2023-11-04 DIAGNOSIS — J702 Acute drug-induced interstitial lung disorders: Secondary | ICD-10-CM | POA: Diagnosis not present

## 2023-11-04 DIAGNOSIS — J703 Chronic drug-induced interstitial lung disorders: Secondary | ICD-10-CM | POA: Diagnosis not present

## 2023-11-05 DIAGNOSIS — J9601 Acute respiratory failure with hypoxia: Secondary | ICD-10-CM | POA: Diagnosis not present

## 2023-11-05 DIAGNOSIS — R0600 Dyspnea, unspecified: Secondary | ICD-10-CM | POA: Diagnosis not present

## 2023-11-07 DIAGNOSIS — G629 Polyneuropathy, unspecified: Secondary | ICD-10-CM | POA: Diagnosis not present

## 2023-11-07 DIAGNOSIS — Z9221 Personal history of antineoplastic chemotherapy: Secondary | ICD-10-CM | POA: Diagnosis not present

## 2023-11-07 DIAGNOSIS — R530 Neoplastic (malignant) related fatigue: Secondary | ICD-10-CM | POA: Diagnosis not present

## 2023-11-07 DIAGNOSIS — K219 Gastro-esophageal reflux disease without esophagitis: Secondary | ICD-10-CM | POA: Diagnosis not present

## 2023-11-07 DIAGNOSIS — C782 Secondary malignant neoplasm of pleura: Secondary | ICD-10-CM | POA: Diagnosis not present

## 2023-11-07 DIAGNOSIS — Z8619 Personal history of other infectious and parasitic diseases: Secondary | ICD-10-CM | POA: Diagnosis not present

## 2023-11-07 DIAGNOSIS — Z9981 Dependence on supplemental oxygen: Secondary | ICD-10-CM | POA: Diagnosis not present

## 2023-11-07 DIAGNOSIS — Z9181 History of falling: Secondary | ICD-10-CM | POA: Diagnosis not present

## 2023-11-07 DIAGNOSIS — Z862 Personal history of diseases of the blood and blood-forming organs and certain disorders involving the immune mechanism: Secondary | ICD-10-CM | POA: Diagnosis not present

## 2023-11-07 DIAGNOSIS — C159 Malignant neoplasm of esophagus, unspecified: Secondary | ICD-10-CM | POA: Diagnosis not present

## 2023-11-07 DIAGNOSIS — C155 Malignant neoplasm of lower third of esophagus: Secondary | ICD-10-CM | POA: Diagnosis not present

## 2023-11-07 DIAGNOSIS — C7951 Secondary malignant neoplasm of bone: Secondary | ICD-10-CM | POA: Diagnosis not present

## 2023-11-07 DIAGNOSIS — G893 Neoplasm related pain (acute) (chronic): Secondary | ICD-10-CM | POA: Diagnosis not present

## 2023-11-07 DIAGNOSIS — D801 Nonfamilial hypogammaglobulinemia: Secondary | ICD-10-CM | POA: Diagnosis not present

## 2023-11-07 DIAGNOSIS — Z515 Encounter for palliative care: Secondary | ICD-10-CM | POA: Diagnosis not present

## 2023-11-07 DIAGNOSIS — Z510 Encounter for antineoplastic radiation therapy: Secondary | ICD-10-CM | POA: Diagnosis not present

## 2023-11-08 DIAGNOSIS — Z862 Personal history of diseases of the blood and blood-forming organs and certain disorders involving the immune mechanism: Secondary | ICD-10-CM | POA: Diagnosis not present

## 2023-11-08 DIAGNOSIS — Z9181 History of falling: Secondary | ICD-10-CM | POA: Diagnosis not present

## 2023-11-08 DIAGNOSIS — Z8619 Personal history of other infectious and parasitic diseases: Secondary | ICD-10-CM | POA: Diagnosis not present

## 2023-11-08 DIAGNOSIS — G629 Polyneuropathy, unspecified: Secondary | ICD-10-CM | POA: Diagnosis not present

## 2023-11-08 DIAGNOSIS — Z510 Encounter for antineoplastic radiation therapy: Secondary | ICD-10-CM | POA: Diagnosis not present

## 2023-11-08 DIAGNOSIS — D801 Nonfamilial hypogammaglobulinemia: Secondary | ICD-10-CM | POA: Diagnosis not present

## 2023-11-08 DIAGNOSIS — C7951 Secondary malignant neoplasm of bone: Secondary | ICD-10-CM | POA: Diagnosis not present

## 2023-11-08 DIAGNOSIS — Z9221 Personal history of antineoplastic chemotherapy: Secondary | ICD-10-CM | POA: Diagnosis not present

## 2023-11-08 DIAGNOSIS — C155 Malignant neoplasm of lower third of esophagus: Secondary | ICD-10-CM | POA: Diagnosis not present

## 2023-11-10 DIAGNOSIS — C155 Malignant neoplasm of lower third of esophagus: Secondary | ICD-10-CM | POA: Diagnosis not present

## 2023-11-11 DIAGNOSIS — E7800 Pure hypercholesterolemia, unspecified: Secondary | ICD-10-CM | POA: Diagnosis not present

## 2023-11-11 DIAGNOSIS — C7989 Secondary malignant neoplasm of other specified sites: Secondary | ICD-10-CM | POA: Diagnosis not present

## 2023-11-11 DIAGNOSIS — R918 Other nonspecific abnormal finding of lung field: Secondary | ICD-10-CM | POA: Diagnosis not present

## 2023-11-11 DIAGNOSIS — Z8501 Personal history of malignant neoplasm of esophagus: Secondary | ICD-10-CM | POA: Diagnosis not present

## 2023-11-11 DIAGNOSIS — Z888 Allergy status to other drugs, medicaments and biological substances status: Secondary | ICD-10-CM | POA: Diagnosis not present

## 2023-11-11 DIAGNOSIS — C155 Malignant neoplasm of lower third of esophagus: Secondary | ICD-10-CM | POA: Diagnosis not present

## 2023-11-14 DIAGNOSIS — Z95828 Presence of other vascular implants and grafts: Secondary | ICD-10-CM | POA: Diagnosis not present

## 2023-11-14 DIAGNOSIS — C155 Malignant neoplasm of lower third of esophagus: Secondary | ICD-10-CM | POA: Diagnosis not present

## 2023-11-15 ENCOUNTER — Ambulatory Visit: Admitting: Nurse Practitioner

## 2023-11-15 DIAGNOSIS — G629 Polyneuropathy, unspecified: Secondary | ICD-10-CM | POA: Diagnosis not present

## 2023-11-15 DIAGNOSIS — C7951 Secondary malignant neoplasm of bone: Secondary | ICD-10-CM | POA: Diagnosis not present

## 2023-11-15 DIAGNOSIS — Z510 Encounter for antineoplastic radiation therapy: Secondary | ICD-10-CM | POA: Diagnosis not present

## 2023-11-15 DIAGNOSIS — D801 Nonfamilial hypogammaglobulinemia: Secondary | ICD-10-CM | POA: Diagnosis not present

## 2023-11-15 DIAGNOSIS — Z9221 Personal history of antineoplastic chemotherapy: Secondary | ICD-10-CM | POA: Diagnosis not present

## 2023-11-15 DIAGNOSIS — Z9181 History of falling: Secondary | ICD-10-CM | POA: Diagnosis not present

## 2023-11-15 DIAGNOSIS — C155 Malignant neoplasm of lower third of esophagus: Secondary | ICD-10-CM | POA: Diagnosis not present

## 2023-11-15 DIAGNOSIS — Z862 Personal history of diseases of the blood and blood-forming organs and certain disorders involving the immune mechanism: Secondary | ICD-10-CM | POA: Diagnosis not present

## 2023-11-15 DIAGNOSIS — Z8619 Personal history of other infectious and parasitic diseases: Secondary | ICD-10-CM | POA: Diagnosis not present

## 2023-11-16 DIAGNOSIS — Z510 Encounter for antineoplastic radiation therapy: Secondary | ICD-10-CM | POA: Diagnosis not present

## 2023-11-16 DIAGNOSIS — C155 Malignant neoplasm of lower third of esophagus: Secondary | ICD-10-CM | POA: Diagnosis not present

## 2023-11-16 DIAGNOSIS — Z9181 History of falling: Secondary | ICD-10-CM | POA: Diagnosis not present

## 2023-11-16 DIAGNOSIS — Z8619 Personal history of other infectious and parasitic diseases: Secondary | ICD-10-CM | POA: Diagnosis not present

## 2023-11-16 DIAGNOSIS — C7951 Secondary malignant neoplasm of bone: Secondary | ICD-10-CM | POA: Diagnosis not present

## 2023-11-16 DIAGNOSIS — Z9221 Personal history of antineoplastic chemotherapy: Secondary | ICD-10-CM | POA: Diagnosis not present

## 2023-11-16 DIAGNOSIS — G629 Polyneuropathy, unspecified: Secondary | ICD-10-CM | POA: Diagnosis not present

## 2023-11-16 DIAGNOSIS — D801 Nonfamilial hypogammaglobulinemia: Secondary | ICD-10-CM | POA: Diagnosis not present

## 2023-11-16 DIAGNOSIS — Z862 Personal history of diseases of the blood and blood-forming organs and certain disorders involving the immune mechanism: Secondary | ICD-10-CM | POA: Diagnosis not present

## 2023-11-17 DIAGNOSIS — D801 Nonfamilial hypogammaglobulinemia: Secondary | ICD-10-CM | POA: Diagnosis not present

## 2023-11-17 DIAGNOSIS — C155 Malignant neoplasm of lower third of esophagus: Secondary | ICD-10-CM | POA: Diagnosis not present

## 2023-11-17 DIAGNOSIS — Z510 Encounter for antineoplastic radiation therapy: Secondary | ICD-10-CM | POA: Diagnosis not present

## 2023-11-17 DIAGNOSIS — Z9181 History of falling: Secondary | ICD-10-CM | POA: Diagnosis not present

## 2023-11-17 DIAGNOSIS — G629 Polyneuropathy, unspecified: Secondary | ICD-10-CM | POA: Diagnosis not present

## 2023-11-17 DIAGNOSIS — Z9221 Personal history of antineoplastic chemotherapy: Secondary | ICD-10-CM | POA: Diagnosis not present

## 2023-11-17 DIAGNOSIS — Z862 Personal history of diseases of the blood and blood-forming organs and certain disorders involving the immune mechanism: Secondary | ICD-10-CM | POA: Diagnosis not present

## 2023-11-17 DIAGNOSIS — C7951 Secondary malignant neoplasm of bone: Secondary | ICD-10-CM | POA: Diagnosis not present

## 2023-11-17 DIAGNOSIS — Z8619 Personal history of other infectious and parasitic diseases: Secondary | ICD-10-CM | POA: Diagnosis not present

## 2023-11-18 DIAGNOSIS — Z862 Personal history of diseases of the blood and blood-forming organs and certain disorders involving the immune mechanism: Secondary | ICD-10-CM | POA: Diagnosis not present

## 2023-11-18 DIAGNOSIS — D801 Nonfamilial hypogammaglobulinemia: Secondary | ICD-10-CM | POA: Diagnosis not present

## 2023-11-18 DIAGNOSIS — Z9181 History of falling: Secondary | ICD-10-CM | POA: Diagnosis not present

## 2023-11-18 DIAGNOSIS — Z8619 Personal history of other infectious and parasitic diseases: Secondary | ICD-10-CM | POA: Diagnosis not present

## 2023-11-18 DIAGNOSIS — Z510 Encounter for antineoplastic radiation therapy: Secondary | ICD-10-CM | POA: Diagnosis not present

## 2023-11-18 DIAGNOSIS — G629 Polyneuropathy, unspecified: Secondary | ICD-10-CM | POA: Diagnosis not present

## 2023-11-18 DIAGNOSIS — C155 Malignant neoplasm of lower third of esophagus: Secondary | ICD-10-CM | POA: Diagnosis not present

## 2023-11-18 DIAGNOSIS — C7951 Secondary malignant neoplasm of bone: Secondary | ICD-10-CM | POA: Diagnosis not present

## 2023-11-18 DIAGNOSIS — Z9221 Personal history of antineoplastic chemotherapy: Secondary | ICD-10-CM | POA: Diagnosis not present

## 2023-11-21 DIAGNOSIS — C7951 Secondary malignant neoplasm of bone: Secondary | ICD-10-CM | POA: Diagnosis not present

## 2023-11-21 DIAGNOSIS — Z9181 History of falling: Secondary | ICD-10-CM | POA: Diagnosis not present

## 2023-11-21 DIAGNOSIS — Z862 Personal history of diseases of the blood and blood-forming organs and certain disorders involving the immune mechanism: Secondary | ICD-10-CM | POA: Diagnosis not present

## 2023-11-21 DIAGNOSIS — D801 Nonfamilial hypogammaglobulinemia: Secondary | ICD-10-CM | POA: Diagnosis not present

## 2023-11-21 DIAGNOSIS — Z8619 Personal history of other infectious and parasitic diseases: Secondary | ICD-10-CM | POA: Diagnosis not present

## 2023-11-21 DIAGNOSIS — Z510 Encounter for antineoplastic radiation therapy: Secondary | ICD-10-CM | POA: Diagnosis not present

## 2023-11-21 DIAGNOSIS — Z9221 Personal history of antineoplastic chemotherapy: Secondary | ICD-10-CM | POA: Diagnosis not present

## 2023-11-21 DIAGNOSIS — G629 Polyneuropathy, unspecified: Secondary | ICD-10-CM | POA: Diagnosis not present

## 2023-11-21 DIAGNOSIS — C155 Malignant neoplasm of lower third of esophagus: Secondary | ICD-10-CM | POA: Diagnosis not present

## 2023-11-22 DIAGNOSIS — Z8619 Personal history of other infectious and parasitic diseases: Secondary | ICD-10-CM | POA: Diagnosis not present

## 2023-11-22 DIAGNOSIS — G629 Polyneuropathy, unspecified: Secondary | ICD-10-CM | POA: Diagnosis not present

## 2023-11-22 DIAGNOSIS — Z510 Encounter for antineoplastic radiation therapy: Secondary | ICD-10-CM | POA: Diagnosis not present

## 2023-11-22 DIAGNOSIS — C7951 Secondary malignant neoplasm of bone: Secondary | ICD-10-CM | POA: Diagnosis not present

## 2023-11-22 DIAGNOSIS — Z9181 History of falling: Secondary | ICD-10-CM | POA: Diagnosis not present

## 2023-11-22 DIAGNOSIS — D801 Nonfamilial hypogammaglobulinemia: Secondary | ICD-10-CM | POA: Diagnosis not present

## 2023-11-22 DIAGNOSIS — Z9221 Personal history of antineoplastic chemotherapy: Secondary | ICD-10-CM | POA: Diagnosis not present

## 2023-11-22 DIAGNOSIS — C155 Malignant neoplasm of lower third of esophagus: Secondary | ICD-10-CM | POA: Diagnosis not present

## 2023-11-22 DIAGNOSIS — Z862 Personal history of diseases of the blood and blood-forming organs and certain disorders involving the immune mechanism: Secondary | ICD-10-CM | POA: Diagnosis not present

## 2023-11-23 DIAGNOSIS — D801 Nonfamilial hypogammaglobulinemia: Secondary | ICD-10-CM | POA: Diagnosis not present

## 2023-11-23 DIAGNOSIS — Z510 Encounter for antineoplastic radiation therapy: Secondary | ICD-10-CM | POA: Diagnosis not present

## 2023-11-23 DIAGNOSIS — G629 Polyneuropathy, unspecified: Secondary | ICD-10-CM | POA: Diagnosis not present

## 2023-11-23 DIAGNOSIS — Z8619 Personal history of other infectious and parasitic diseases: Secondary | ICD-10-CM | POA: Diagnosis not present

## 2023-11-23 DIAGNOSIS — Z862 Personal history of diseases of the blood and blood-forming organs and certain disorders involving the immune mechanism: Secondary | ICD-10-CM | POA: Diagnosis not present

## 2023-11-23 DIAGNOSIS — Z9221 Personal history of antineoplastic chemotherapy: Secondary | ICD-10-CM | POA: Diagnosis not present

## 2023-11-23 DIAGNOSIS — Z9181 History of falling: Secondary | ICD-10-CM | POA: Diagnosis not present

## 2023-11-23 DIAGNOSIS — C155 Malignant neoplasm of lower third of esophagus: Secondary | ICD-10-CM | POA: Diagnosis not present

## 2023-11-23 DIAGNOSIS — C7951 Secondary malignant neoplasm of bone: Secondary | ICD-10-CM | POA: Diagnosis not present

## 2023-11-24 DIAGNOSIS — Z862 Personal history of diseases of the blood and blood-forming organs and certain disorders involving the immune mechanism: Secondary | ICD-10-CM | POA: Diagnosis not present

## 2023-11-24 DIAGNOSIS — C7951 Secondary malignant neoplasm of bone: Secondary | ICD-10-CM | POA: Diagnosis not present

## 2023-11-24 DIAGNOSIS — G629 Polyneuropathy, unspecified: Secondary | ICD-10-CM | POA: Diagnosis not present

## 2023-11-24 DIAGNOSIS — C155 Malignant neoplasm of lower third of esophagus: Secondary | ICD-10-CM | POA: Diagnosis not present

## 2023-11-24 DIAGNOSIS — Z8619 Personal history of other infectious and parasitic diseases: Secondary | ICD-10-CM | POA: Diagnosis not present

## 2023-11-24 DIAGNOSIS — Z510 Encounter for antineoplastic radiation therapy: Secondary | ICD-10-CM | POA: Diagnosis not present

## 2023-11-24 DIAGNOSIS — Z9181 History of falling: Secondary | ICD-10-CM | POA: Diagnosis not present

## 2023-11-24 DIAGNOSIS — Z9221 Personal history of antineoplastic chemotherapy: Secondary | ICD-10-CM | POA: Diagnosis not present

## 2023-11-24 DIAGNOSIS — D801 Nonfamilial hypogammaglobulinemia: Secondary | ICD-10-CM | POA: Diagnosis not present

## 2023-11-25 DIAGNOSIS — Z862 Personal history of diseases of the blood and blood-forming organs and certain disorders involving the immune mechanism: Secondary | ICD-10-CM | POA: Diagnosis not present

## 2023-11-25 DIAGNOSIS — Z9181 History of falling: Secondary | ICD-10-CM | POA: Diagnosis not present

## 2023-11-25 DIAGNOSIS — C7951 Secondary malignant neoplasm of bone: Secondary | ICD-10-CM | POA: Diagnosis not present

## 2023-11-25 DIAGNOSIS — Z510 Encounter for antineoplastic radiation therapy: Secondary | ICD-10-CM | POA: Diagnosis not present

## 2023-11-25 DIAGNOSIS — Z8619 Personal history of other infectious and parasitic diseases: Secondary | ICD-10-CM | POA: Diagnosis not present

## 2023-11-25 DIAGNOSIS — C155 Malignant neoplasm of lower third of esophagus: Secondary | ICD-10-CM | POA: Diagnosis not present

## 2023-11-25 DIAGNOSIS — G629 Polyneuropathy, unspecified: Secondary | ICD-10-CM | POA: Diagnosis not present

## 2023-11-25 DIAGNOSIS — D801 Nonfamilial hypogammaglobulinemia: Secondary | ICD-10-CM | POA: Diagnosis not present

## 2023-11-25 DIAGNOSIS — Z9221 Personal history of antineoplastic chemotherapy: Secondary | ICD-10-CM | POA: Diagnosis not present

## 2023-11-28 DIAGNOSIS — J90 Pleural effusion, not elsewhere classified: Secondary | ICD-10-CM | POA: Diagnosis not present

## 2023-11-28 DIAGNOSIS — R918 Other nonspecific abnormal finding of lung field: Secondary | ICD-10-CM | POA: Diagnosis not present

## 2023-11-28 DIAGNOSIS — E7800 Pure hypercholesterolemia, unspecified: Secondary | ICD-10-CM | POA: Diagnosis not present

## 2023-11-28 DIAGNOSIS — K219 Gastro-esophageal reflux disease without esophagitis: Secondary | ICD-10-CM | POA: Diagnosis not present

## 2023-11-28 DIAGNOSIS — Z951 Presence of aortocoronary bypass graft: Secondary | ICD-10-CM | POA: Diagnosis not present

## 2023-11-28 DIAGNOSIS — J189 Pneumonia, unspecified organism: Secondary | ICD-10-CM | POA: Diagnosis not present

## 2023-11-28 DIAGNOSIS — C159 Malignant neoplasm of esophagus, unspecified: Secondary | ICD-10-CM | POA: Diagnosis not present

## 2023-11-28 DIAGNOSIS — R058 Other specified cough: Secondary | ICD-10-CM | POA: Diagnosis not present

## 2023-11-28 DIAGNOSIS — J9811 Atelectasis: Secondary | ICD-10-CM | POA: Diagnosis not present

## 2023-11-28 DIAGNOSIS — I959 Hypotension, unspecified: Secondary | ICD-10-CM | POA: Diagnosis not present

## 2023-11-28 DIAGNOSIS — Z7901 Long term (current) use of anticoagulants: Secondary | ICD-10-CM | POA: Diagnosis not present

## 2023-11-28 DIAGNOSIS — I2510 Atherosclerotic heart disease of native coronary artery without angina pectoris: Secondary | ICD-10-CM | POA: Diagnosis not present

## 2023-11-28 DIAGNOSIS — K5900 Constipation, unspecified: Secondary | ICD-10-CM | POA: Diagnosis not present

## 2023-11-28 DIAGNOSIS — Z87891 Personal history of nicotine dependence: Secondary | ICD-10-CM | POA: Diagnosis not present

## 2023-11-29 DIAGNOSIS — G629 Polyneuropathy, unspecified: Secondary | ICD-10-CM | POA: Diagnosis not present

## 2023-11-29 DIAGNOSIS — Z862 Personal history of diseases of the blood and blood-forming organs and certain disorders involving the immune mechanism: Secondary | ICD-10-CM | POA: Diagnosis not present

## 2023-11-29 DIAGNOSIS — Z8619 Personal history of other infectious and parasitic diseases: Secondary | ICD-10-CM | POA: Diagnosis not present

## 2023-11-29 DIAGNOSIS — C7951 Secondary malignant neoplasm of bone: Secondary | ICD-10-CM | POA: Diagnosis not present

## 2023-11-29 DIAGNOSIS — C155 Malignant neoplasm of lower third of esophagus: Secondary | ICD-10-CM | POA: Diagnosis not present

## 2023-11-29 DIAGNOSIS — D801 Nonfamilial hypogammaglobulinemia: Secondary | ICD-10-CM | POA: Diagnosis not present

## 2023-11-29 DIAGNOSIS — R0600 Dyspnea, unspecified: Secondary | ICD-10-CM | POA: Diagnosis not present

## 2023-11-29 DIAGNOSIS — Z9221 Personal history of antineoplastic chemotherapy: Secondary | ICD-10-CM | POA: Diagnosis not present

## 2023-11-29 DIAGNOSIS — J9601 Acute respiratory failure with hypoxia: Secondary | ICD-10-CM | POA: Diagnosis not present

## 2023-11-29 DIAGNOSIS — Z510 Encounter for antineoplastic radiation therapy: Secondary | ICD-10-CM | POA: Diagnosis not present

## 2023-11-29 DIAGNOSIS — Z9181 History of falling: Secondary | ICD-10-CM | POA: Diagnosis not present

## 2023-11-30 DIAGNOSIS — G629 Polyneuropathy, unspecified: Secondary | ICD-10-CM | POA: Diagnosis not present

## 2023-11-30 DIAGNOSIS — Z9181 History of falling: Secondary | ICD-10-CM | POA: Diagnosis not present

## 2023-11-30 DIAGNOSIS — Z510 Encounter for antineoplastic radiation therapy: Secondary | ICD-10-CM | POA: Diagnosis not present

## 2023-11-30 DIAGNOSIS — Z9221 Personal history of antineoplastic chemotherapy: Secondary | ICD-10-CM | POA: Diagnosis not present

## 2023-11-30 DIAGNOSIS — C7951 Secondary malignant neoplasm of bone: Secondary | ICD-10-CM | POA: Diagnosis not present

## 2023-11-30 DIAGNOSIS — D801 Nonfamilial hypogammaglobulinemia: Secondary | ICD-10-CM | POA: Diagnosis not present

## 2023-11-30 DIAGNOSIS — C155 Malignant neoplasm of lower third of esophagus: Secondary | ICD-10-CM | POA: Diagnosis not present

## 2023-11-30 DIAGNOSIS — Z862 Personal history of diseases of the blood and blood-forming organs and certain disorders involving the immune mechanism: Secondary | ICD-10-CM | POA: Diagnosis not present

## 2023-11-30 DIAGNOSIS — Z8619 Personal history of other infectious and parasitic diseases: Secondary | ICD-10-CM | POA: Diagnosis not present

## 2023-12-02 DIAGNOSIS — C155 Malignant neoplasm of lower third of esophagus: Secondary | ICD-10-CM | POA: Diagnosis not present

## 2023-12-02 DIAGNOSIS — Z95828 Presence of other vascular implants and grafts: Secondary | ICD-10-CM | POA: Diagnosis not present

## 2023-12-05 DIAGNOSIS — Z95828 Presence of other vascular implants and grafts: Secondary | ICD-10-CM | POA: Diagnosis not present

## 2023-12-05 DIAGNOSIS — J9601 Acute respiratory failure with hypoxia: Secondary | ICD-10-CM | POA: Diagnosis not present

## 2023-12-05 DIAGNOSIS — C155 Malignant neoplasm of lower third of esophagus: Secondary | ICD-10-CM | POA: Diagnosis not present

## 2023-12-05 DIAGNOSIS — R0600 Dyspnea, unspecified: Secondary | ICD-10-CM | POA: Diagnosis not present

## 2023-12-06 DIAGNOSIS — C155 Malignant neoplasm of lower third of esophagus: Secondary | ICD-10-CM | POA: Diagnosis not present

## 2023-12-08 DIAGNOSIS — C155 Malignant neoplasm of lower third of esophagus: Secondary | ICD-10-CM | POA: Diagnosis not present

## 2023-12-08 DIAGNOSIS — C3491 Malignant neoplasm of unspecified part of right bronchus or lung: Secondary | ICD-10-CM | POA: Diagnosis not present

## 2023-12-08 DIAGNOSIS — J189 Pneumonia, unspecified organism: Secondary | ICD-10-CM | POA: Diagnosis not present

## 2023-12-08 DIAGNOSIS — D709 Neutropenia, unspecified: Secondary | ICD-10-CM | POA: Diagnosis not present

## 2023-12-08 DIAGNOSIS — Z792 Long term (current) use of antibiotics: Secondary | ICD-10-CM | POA: Diagnosis not present

## 2023-12-08 DIAGNOSIS — G629 Polyneuropathy, unspecified: Secondary | ICD-10-CM | POA: Diagnosis not present

## 2023-12-08 DIAGNOSIS — E860 Dehydration: Secondary | ICD-10-CM | POA: Diagnosis not present

## 2023-12-08 DIAGNOSIS — C159 Malignant neoplasm of esophagus, unspecified: Secondary | ICD-10-CM | POA: Diagnosis not present

## 2023-12-08 DIAGNOSIS — Z1731 Human epidermal growth factor receptor 2 positive status: Secondary | ICD-10-CM | POA: Diagnosis not present

## 2023-12-08 DIAGNOSIS — Z5111 Encounter for antineoplastic chemotherapy: Secondary | ICD-10-CM | POA: Diagnosis not present

## 2023-12-12 DIAGNOSIS — C155 Malignant neoplasm of lower third of esophagus: Secondary | ICD-10-CM | POA: Diagnosis not present

## 2023-12-15 DIAGNOSIS — R530 Neoplastic (malignant) related fatigue: Secondary | ICD-10-CM | POA: Diagnosis not present

## 2023-12-15 DIAGNOSIS — C7951 Secondary malignant neoplasm of bone: Secondary | ICD-10-CM | POA: Diagnosis not present

## 2023-12-15 DIAGNOSIS — C159 Malignant neoplasm of esophagus, unspecified: Secondary | ICD-10-CM | POA: Diagnosis not present

## 2023-12-15 DIAGNOSIS — K219 Gastro-esophageal reflux disease without esophagitis: Secondary | ICD-10-CM | POA: Diagnosis not present

## 2023-12-15 DIAGNOSIS — Z515 Encounter for palliative care: Secondary | ICD-10-CM | POA: Diagnosis not present

## 2023-12-15 DIAGNOSIS — G893 Neoplasm related pain (acute) (chronic): Secondary | ICD-10-CM | POA: Diagnosis not present

## 2023-12-15 DIAGNOSIS — Z9981 Dependence on supplemental oxygen: Secondary | ICD-10-CM | POA: Diagnosis not present

## 2023-12-15 DIAGNOSIS — C782 Secondary malignant neoplasm of pleura: Secondary | ICD-10-CM | POA: Diagnosis not present

## 2023-12-16 DIAGNOSIS — C155 Malignant neoplasm of lower third of esophagus: Secondary | ICD-10-CM | POA: Diagnosis not present

## 2023-12-16 DIAGNOSIS — Z95828 Presence of other vascular implants and grafts: Secondary | ICD-10-CM | POA: Diagnosis not present

## 2023-12-19 DIAGNOSIS — C155 Malignant neoplasm of lower third of esophagus: Secondary | ICD-10-CM | POA: Diagnosis not present

## 2023-12-23 DIAGNOSIS — C155 Malignant neoplasm of lower third of esophagus: Secondary | ICD-10-CM | POA: Diagnosis not present

## 2023-12-26 DIAGNOSIS — C155 Malignant neoplasm of lower third of esophagus: Secondary | ICD-10-CM | POA: Diagnosis not present

## 2023-12-26 DIAGNOSIS — Z95828 Presence of other vascular implants and grafts: Secondary | ICD-10-CM | POA: Diagnosis not present

## 2023-12-29 DIAGNOSIS — R0600 Dyspnea, unspecified: Secondary | ICD-10-CM | POA: Diagnosis not present

## 2023-12-29 DIAGNOSIS — J9601 Acute respiratory failure with hypoxia: Secondary | ICD-10-CM | POA: Diagnosis not present

## 2023-12-30 DIAGNOSIS — C155 Malignant neoplasm of lower third of esophagus: Secondary | ICD-10-CM | POA: Diagnosis not present

## 2024-01-05 DIAGNOSIS — R0600 Dyspnea, unspecified: Secondary | ICD-10-CM | POA: Diagnosis not present

## 2024-01-05 DIAGNOSIS — J9601 Acute respiratory failure with hypoxia: Secondary | ICD-10-CM | POA: Diagnosis not present

## 2024-01-06 DIAGNOSIS — C155 Malignant neoplasm of lower third of esophagus: Secondary | ICD-10-CM | POA: Diagnosis not present

## 2024-01-09 DIAGNOSIS — C155 Malignant neoplasm of lower third of esophagus: Secondary | ICD-10-CM | POA: Diagnosis not present

## 2024-01-13 DIAGNOSIS — C155 Malignant neoplasm of lower third of esophagus: Secondary | ICD-10-CM | POA: Diagnosis not present

## 2024-01-13 DIAGNOSIS — Z95828 Presence of other vascular implants and grafts: Secondary | ICD-10-CM | POA: Diagnosis not present

## 2024-01-16 DIAGNOSIS — C155 Malignant neoplasm of lower third of esophagus: Secondary | ICD-10-CM | POA: Diagnosis not present

## 2024-01-16 DIAGNOSIS — Z95828 Presence of other vascular implants and grafts: Secondary | ICD-10-CM | POA: Diagnosis not present

## 2024-01-20 DIAGNOSIS — C155 Malignant neoplasm of lower third of esophagus: Secondary | ICD-10-CM | POA: Diagnosis not present

## 2024-01-23 DIAGNOSIS — Z95828 Presence of other vascular implants and grafts: Secondary | ICD-10-CM | POA: Diagnosis not present

## 2024-01-23 DIAGNOSIS — C155 Malignant neoplasm of lower third of esophagus: Secondary | ICD-10-CM | POA: Diagnosis not present

## 2024-01-27 DIAGNOSIS — C155 Malignant neoplasm of lower third of esophagus: Secondary | ICD-10-CM | POA: Diagnosis not present

## 2024-01-29 DIAGNOSIS — J9601 Acute respiratory failure with hypoxia: Secondary | ICD-10-CM | POA: Diagnosis not present

## 2024-01-29 DIAGNOSIS — R0600 Dyspnea, unspecified: Secondary | ICD-10-CM | POA: Diagnosis not present

## 2024-01-30 DIAGNOSIS — R110 Nausea: Secondary | ICD-10-CM | POA: Diagnosis not present

## 2024-01-30 DIAGNOSIS — Z95828 Presence of other vascular implants and grafts: Secondary | ICD-10-CM | POA: Diagnosis not present

## 2024-01-30 DIAGNOSIS — C155 Malignant neoplasm of lower third of esophagus: Secondary | ICD-10-CM | POA: Diagnosis not present

## 2024-02-05 DIAGNOSIS — R0600 Dyspnea, unspecified: Secondary | ICD-10-CM | POA: Diagnosis not present

## 2024-02-05 DIAGNOSIS — J9601 Acute respiratory failure with hypoxia: Secondary | ICD-10-CM | POA: Diagnosis not present

## 2024-02-10 DIAGNOSIS — C155 Malignant neoplasm of lower third of esophagus: Secondary | ICD-10-CM | POA: Diagnosis not present

## 2024-02-13 DIAGNOSIS — C155 Malignant neoplasm of lower third of esophagus: Secondary | ICD-10-CM | POA: Diagnosis not present

## 2024-02-13 DIAGNOSIS — R110 Nausea: Secondary | ICD-10-CM | POA: Diagnosis not present

## 2024-02-29 DIAGNOSIS — R0600 Dyspnea, unspecified: Secondary | ICD-10-CM | POA: Diagnosis not present

## 2024-02-29 DIAGNOSIS — J9601 Acute respiratory failure with hypoxia: Secondary | ICD-10-CM | POA: Diagnosis not present

## 2024-03-06 DIAGNOSIS — R0600 Dyspnea, unspecified: Secondary | ICD-10-CM | POA: Diagnosis not present

## 2024-03-06 DIAGNOSIS — J9601 Acute respiratory failure with hypoxia: Secondary | ICD-10-CM | POA: Diagnosis not present

## 2024-03-07 DEATH — deceased
